# Patient Record
Sex: Male | Born: 1935 | Race: White | Hispanic: No | Marital: Married | Smoking: Former smoker
Health system: Southern US, Community
[De-identification: ages and names within clinical notes are randomized; demographics above are authoritative.]

## PROBLEM LIST (undated history)

## (undated) DIAGNOSIS — R5381 Other malaise: Secondary | ICD-10-CM

## (undated) DIAGNOSIS — G4733 Obstructive sleep apnea (adult) (pediatric): Secondary | ICD-10-CM

## (undated) DIAGNOSIS — G894 Chronic pain syndrome: Secondary | ICD-10-CM

## (undated) DIAGNOSIS — C9201 Acute myeloblastic leukemia, in remission: Secondary | ICD-10-CM

## (undated) DIAGNOSIS — M109 Gout, unspecified: Secondary | ICD-10-CM

## (undated) DIAGNOSIS — I219 Acute myocardial infarction, unspecified: Secondary | ICD-10-CM

## (undated) DIAGNOSIS — N529 Male erectile dysfunction, unspecified: Secondary | ICD-10-CM

## (undated) DIAGNOSIS — R1310 Dysphagia, unspecified: Secondary | ICD-10-CM

## (undated) DIAGNOSIS — I259 Chronic ischemic heart disease, unspecified: Secondary | ICD-10-CM

## (undated) DIAGNOSIS — K5730 Diverticulosis of large intestine without perforation or abscess without bleeding: Secondary | ICD-10-CM

## (undated) DIAGNOSIS — M545 Low back pain: Secondary | ICD-10-CM

## (undated) DIAGNOSIS — J329 Chronic sinusitis, unspecified: Secondary | ICD-10-CM

## (undated) DIAGNOSIS — I639 Cerebral infarction, unspecified: Secondary | ICD-10-CM

## (undated) DIAGNOSIS — K5732 Diverticulitis of large intestine without perforation or abscess without bleeding: Secondary | ICD-10-CM

## (undated) DIAGNOSIS — M25559 Pain in unspecified hip: Secondary | ICD-10-CM

## (undated) DIAGNOSIS — C801 Malignant (primary) neoplasm, unspecified: Secondary | ICD-10-CM

## (undated) DIAGNOSIS — N183 Chronic kidney disease, stage 3 (moderate): Secondary | ICD-10-CM

## (undated) DIAGNOSIS — N419 Inflammatory disease of prostate, unspecified: Secondary | ICD-10-CM

## (undated) DIAGNOSIS — I499 Cardiac arrhythmia, unspecified: Secondary | ICD-10-CM

## (undated) DIAGNOSIS — E785 Hyperlipidemia, unspecified: Secondary | ICD-10-CM

## (undated) DIAGNOSIS — J383 Other diseases of vocal cords: Secondary | ICD-10-CM

## (undated) DIAGNOSIS — I4891 Unspecified atrial fibrillation: Secondary | ICD-10-CM

## (undated) DIAGNOSIS — N401 Enlarged prostate with lower urinary tract symptoms: Secondary | ICD-10-CM

## (undated) DIAGNOSIS — M5430 Sciatica, unspecified side: Secondary | ICD-10-CM

## (undated) DIAGNOSIS — I428 Other cardiomyopathies: Secondary | ICD-10-CM

## (undated) DIAGNOSIS — I10 Essential (primary) hypertension: Secondary | ICD-10-CM

## (undated) DIAGNOSIS — G4700 Insomnia, unspecified: Secondary | ICD-10-CM

## (undated) DIAGNOSIS — I2510 Atherosclerotic heart disease of native coronary artery without angina pectoris: Secondary | ICD-10-CM

## (undated) DIAGNOSIS — Z515 Encounter for palliative care: Secondary | ICD-10-CM

## (undated) DIAGNOSIS — Z95810 Presence of automatic (implantable) cardiac defibrillator: Secondary | ICD-10-CM

## (undated) DIAGNOSIS — R498 Other voice and resonance disorders: Secondary | ICD-10-CM

## (undated) DIAGNOSIS — G7241 Inclusion body myositis [IBM]: Secondary | ICD-10-CM

## (undated) DIAGNOSIS — E119 Type 2 diabetes mellitus without complications: Secondary | ICD-10-CM

## (undated) DIAGNOSIS — D649 Anemia, unspecified: Secondary | ICD-10-CM

## (undated) DIAGNOSIS — Z9181 History of falling: Secondary | ICD-10-CM

## (undated) DIAGNOSIS — I509 Heart failure, unspecified: Secondary | ICD-10-CM

## (undated) DIAGNOSIS — I209 Angina pectoris, unspecified: Secondary | ICD-10-CM

## (undated) DIAGNOSIS — G609 Hereditary and idiopathic neuropathy, unspecified: Secondary | ICD-10-CM

## (undated) DIAGNOSIS — Z7901 Long term (current) use of anticoagulants: Secondary | ICD-10-CM

## (undated) DIAGNOSIS — F419 Anxiety disorder, unspecified: Secondary | ICD-10-CM

## (undated) DIAGNOSIS — R0602 Shortness of breath: Secondary | ICD-10-CM

## (undated) DIAGNOSIS — K219 Gastro-esophageal reflux disease without esophagitis: Secondary | ICD-10-CM

## (undated) DIAGNOSIS — J690 Pneumonitis due to inhalation of food and vomit: Secondary | ICD-10-CM

## (undated) DIAGNOSIS — I739 Peripheral vascular disease, unspecified: Secondary | ICD-10-CM

## (undated) DIAGNOSIS — R269 Unspecified abnormalities of gait and mobility: Secondary | ICD-10-CM

## (undated) DIAGNOSIS — J449 Chronic obstructive pulmonary disease, unspecified: Secondary | ICD-10-CM

## (undated) DIAGNOSIS — I6529 Occlusion and stenosis of unspecified carotid artery: Secondary | ICD-10-CM

## (undated) DIAGNOSIS — R443 Hallucinations, unspecified: Secondary | ICD-10-CM

## (undated) HISTORY — DX: Occlusion and stenosis of unspecified carotid artery: I65.29

## (undated) HISTORY — DX: Unspecified abnormalities of gait and mobility: R26.9

## (undated) HISTORY — DX: Obstructive sleep apnea (adult) (pediatric): G47.33

## (undated) HISTORY — DX: Anemia, unspecified: D64.9

## (undated) HISTORY — DX: Pain in unspecified hip: M25.559

## (undated) HISTORY — DX: Chronic pain syndrome: G89.4

## (undated) HISTORY — DX: Benign prostatic hyperplasia with lower urinary tract symptoms: N40.1

## (undated) HISTORY — DX: Unspecified atrial fibrillation: I48.91

## (undated) HISTORY — DX: Essential (primary) hypertension: I10

## (undated) HISTORY — DX: Insomnia, unspecified: G47.00

## (undated) HISTORY — DX: Pneumonitis due to inhalation of food and vomit: J69.0

## (undated) HISTORY — DX: Other cardiomyopathies: I42.8

## (undated) HISTORY — DX: Peripheral vascular disease, unspecified: I73.9

## (undated) HISTORY — DX: Long term (current) use of anticoagulants: Z79.01

## (undated) HISTORY — DX: Hallucinations, unspecified: R44.3

## (undated) HISTORY — DX: Cerebral infarction, unspecified: I63.9

## (undated) HISTORY — DX: Dysphagia, unspecified: R13.10

## (undated) HISTORY — DX: History of falling: Z91.81

## (undated) HISTORY — DX: Other diseases of vocal cords: J38.3

## (undated) HISTORY — DX: Other malaise: R53.81

## (undated) HISTORY — DX: Inflammatory disease of prostate, unspecified: N41.9

## (undated) HISTORY — DX: Low back pain: M54.5

## (undated) HISTORY — PX: CORONARY ARTERY BYPASS GRAFT: SHX141

## (undated) HISTORY — DX: Other voice and resonance disorders: R49.8

## (undated) HISTORY — DX: Chronic kidney disease, stage 3 (moderate): N18.3

## (undated) HISTORY — DX: Diverticulitis of large intestine without perforation or abscess without bleeding: K57.32

## (undated) HISTORY — DX: Gout, unspecified: M10.9

## (undated) HISTORY — DX: Anxiety disorder, unspecified: F41.9

## (undated) HISTORY — PX: OTHER SURGICAL HISTORY: SHX169

## (undated) HISTORY — DX: Diverticulosis of large intestine without perforation or abscess without bleeding: K57.30

## (undated) HISTORY — DX: Male erectile dysfunction, unspecified: N52.9

## (undated) HISTORY — DX: Hyperlipidemia, unspecified: E78.5

## (undated) HISTORY — DX: Chronic obstructive pulmonary disease, unspecified: J44.9

## (undated) HISTORY — DX: Chronic ischemic heart disease, unspecified: I25.9

## (undated) HISTORY — DX: Chronic sinusitis, unspecified: J32.9

## (undated) HISTORY — DX: Hereditary and idiopathic neuropathy, unspecified: G60.9

## (undated) HISTORY — DX: Sciatica, unspecified side: M54.30

---

## 1988-02-23 HISTORY — PX: MYELOGRAM: SHX5347

## 1991-08-23 HISTORY — PX: PTCA: SHX146

## 1992-09-22 HISTORY — PX: ABDOMINAL AORTIC ANEURYSM REPAIR: SUR1152

## 1996-02-23 HISTORY — PX: LITHOTRIPSY: SUR834

## 1996-11-22 HISTORY — PX: CARDIOVASCULAR STRESS TEST: SHX262

## 1996-12-23 HISTORY — PX: CARDIOVASCULAR STRESS TEST: SHX262

## 1998-08-23 ENCOUNTER — Encounter: Payer: Self-pay | Admitting: Internal Medicine

## 1998-08-23 ENCOUNTER — Ambulatory Visit (HOSPITAL_COMMUNITY): Admission: RE | Admit: 1998-08-23 | Discharge: 1998-08-23 | Payer: Self-pay | Admitting: Internal Medicine

## 1999-05-01 ENCOUNTER — Encounter: Payer: Self-pay | Admitting: Internal Medicine

## 1999-05-01 ENCOUNTER — Encounter: Admission: RE | Admit: 1999-05-01 | Discharge: 1999-05-01 | Payer: Self-pay | Admitting: Internal Medicine

## 1999-06-18 ENCOUNTER — Ambulatory Visit (HOSPITAL_COMMUNITY): Admission: RE | Admit: 1999-06-18 | Discharge: 1999-06-18 | Payer: Self-pay | Admitting: Orthopedic Surgery

## 1999-06-22 ENCOUNTER — Encounter: Payer: Self-pay | Admitting: Orthopedic Surgery

## 1999-06-22 ENCOUNTER — Ambulatory Visit (HOSPITAL_COMMUNITY): Admission: RE | Admit: 1999-06-22 | Discharge: 1999-06-22 | Payer: Self-pay | Admitting: Orthopedic Surgery

## 1999-07-20 ENCOUNTER — Ambulatory Visit (HOSPITAL_COMMUNITY): Admission: RE | Admit: 1999-07-20 | Discharge: 1999-07-20 | Payer: Self-pay | Admitting: Orthopedic Surgery

## 1999-07-20 ENCOUNTER — Encounter: Payer: Self-pay | Admitting: Orthopedic Surgery

## 1999-08-03 ENCOUNTER — Encounter: Payer: Self-pay | Admitting: Orthopedic Surgery

## 1999-08-03 ENCOUNTER — Ambulatory Visit (HOSPITAL_COMMUNITY): Admission: RE | Admit: 1999-08-03 | Discharge: 1999-08-03 | Payer: Self-pay | Admitting: Orthopedic Surgery

## 1999-12-28 ENCOUNTER — Encounter: Admission: RE | Admit: 1999-12-28 | Discharge: 1999-12-28 | Payer: Self-pay | Admitting: Specialist

## 1999-12-28 ENCOUNTER — Encounter: Payer: Self-pay | Admitting: Specialist

## 2000-01-06 ENCOUNTER — Encounter: Payer: Self-pay | Admitting: Specialist

## 2000-01-06 ENCOUNTER — Ambulatory Visit (HOSPITAL_COMMUNITY): Admission: RE | Admit: 2000-01-06 | Discharge: 2000-01-06 | Payer: Self-pay | Admitting: Specialist

## 2000-05-17 ENCOUNTER — Encounter: Admission: RE | Admit: 2000-05-17 | Discharge: 2000-05-17 | Payer: Self-pay | Admitting: Cardiology

## 2000-05-17 ENCOUNTER — Encounter: Payer: Self-pay | Admitting: Cardiology

## 2000-05-25 ENCOUNTER — Inpatient Hospital Stay (HOSPITAL_COMMUNITY): Admission: RE | Admit: 2000-05-25 | Discharge: 2000-05-26 | Payer: Self-pay | Admitting: Cardiology

## 2000-06-27 ENCOUNTER — Encounter: Payer: Self-pay | Admitting: Orthopedic Surgery

## 2000-06-27 ENCOUNTER — Encounter: Admission: RE | Admit: 2000-06-27 | Discharge: 2000-06-27 | Payer: Self-pay | Admitting: Orthopedic Surgery

## 2000-06-27 ENCOUNTER — Ambulatory Visit (HOSPITAL_COMMUNITY): Admission: RE | Admit: 2000-06-27 | Discharge: 2000-06-27 | Payer: Self-pay | Admitting: Radiology

## 2000-07-11 ENCOUNTER — Encounter: Payer: Self-pay | Admitting: Orthopedic Surgery

## 2000-07-11 ENCOUNTER — Ambulatory Visit (HOSPITAL_COMMUNITY): Admission: RE | Admit: 2000-07-11 | Discharge: 2000-07-11 | Payer: Self-pay | Admitting: Orthopedic Surgery

## 2000-07-11 ENCOUNTER — Encounter: Admission: RE | Admit: 2000-07-11 | Discharge: 2000-07-11 | Payer: Self-pay | Admitting: Orthopedic Surgery

## 2000-07-25 ENCOUNTER — Encounter: Admission: RE | Admit: 2000-07-25 | Discharge: 2000-07-25 | Payer: Self-pay | Admitting: Orthopedic Surgery

## 2000-07-25 ENCOUNTER — Encounter: Payer: Self-pay | Admitting: Orthopedic Surgery

## 2000-08-15 ENCOUNTER — Encounter: Payer: Self-pay | Admitting: Cardiology

## 2000-08-15 ENCOUNTER — Inpatient Hospital Stay (HOSPITAL_COMMUNITY): Admission: EM | Admit: 2000-08-15 | Discharge: 2000-08-20 | Payer: Self-pay | Admitting: Emergency Medicine

## 2000-08-17 ENCOUNTER — Encounter: Payer: Self-pay | Admitting: Vascular Surgery

## 2000-12-12 ENCOUNTER — Ambulatory Visit (HOSPITAL_COMMUNITY): Admission: RE | Admit: 2000-12-12 | Discharge: 2000-12-13 | Payer: Self-pay | Admitting: Cardiology

## 2001-05-02 ENCOUNTER — Encounter (HOSPITAL_BASED_OUTPATIENT_CLINIC_OR_DEPARTMENT_OTHER): Payer: Self-pay | Admitting: Internal Medicine

## 2001-05-02 ENCOUNTER — Inpatient Hospital Stay (HOSPITAL_COMMUNITY): Admission: AD | Admit: 2001-05-02 | Discharge: 2001-05-06 | Payer: Self-pay | Admitting: Internal Medicine

## 2001-05-03 ENCOUNTER — Encounter: Payer: Self-pay | Admitting: Internal Medicine

## 2001-05-04 ENCOUNTER — Encounter: Payer: Self-pay | Admitting: Internal Medicine

## 2001-05-05 ENCOUNTER — Encounter: Payer: Self-pay | Admitting: Internal Medicine

## 2001-05-22 ENCOUNTER — Encounter: Admission: RE | Admit: 2001-05-22 | Discharge: 2001-05-22 | Payer: Self-pay | Admitting: Internal Medicine

## 2001-05-22 ENCOUNTER — Encounter (HOSPITAL_BASED_OUTPATIENT_CLINIC_OR_DEPARTMENT_OTHER): Payer: Self-pay | Admitting: Internal Medicine

## 2001-06-12 ENCOUNTER — Encounter (HOSPITAL_BASED_OUTPATIENT_CLINIC_OR_DEPARTMENT_OTHER): Payer: Self-pay | Admitting: Internal Medicine

## 2001-06-12 ENCOUNTER — Encounter: Admission: RE | Admit: 2001-06-12 | Discharge: 2001-06-12 | Payer: Self-pay | Admitting: Internal Medicine

## 2001-08-22 HISTORY — PX: CARDIAC CATHETERIZATION: SHX172

## 2001-11-13 ENCOUNTER — Encounter (HOSPITAL_BASED_OUTPATIENT_CLINIC_OR_DEPARTMENT_OTHER): Payer: Self-pay | Admitting: Internal Medicine

## 2001-11-13 ENCOUNTER — Encounter: Admission: RE | Admit: 2001-11-13 | Discharge: 2001-11-13 | Payer: Self-pay | Admitting: Internal Medicine

## 2001-12-04 ENCOUNTER — Ambulatory Visit (HOSPITAL_COMMUNITY): Admission: RE | Admit: 2001-12-04 | Discharge: 2001-12-04 | Payer: Self-pay | Admitting: Neurology

## 2001-12-08 ENCOUNTER — Ambulatory Visit (HOSPITAL_COMMUNITY): Admission: RE | Admit: 2001-12-08 | Discharge: 2001-12-08 | Payer: Self-pay | Admitting: Vascular Surgery

## 2002-05-10 ENCOUNTER — Ambulatory Visit (HOSPITAL_COMMUNITY): Admission: RE | Admit: 2002-05-10 | Discharge: 2002-05-11 | Payer: Self-pay | Admitting: Cardiology

## 2002-09-22 ENCOUNTER — Encounter: Payer: Self-pay | Admitting: Emergency Medicine

## 2002-09-22 ENCOUNTER — Emergency Department (HOSPITAL_COMMUNITY): Admission: EM | Admit: 2002-09-22 | Discharge: 2002-09-23 | Payer: Self-pay | Admitting: Emergency Medicine

## 2004-05-13 ENCOUNTER — Encounter: Admission: RE | Admit: 2004-05-13 | Discharge: 2004-05-13 | Payer: Self-pay | Admitting: Internal Medicine

## 2004-05-15 ENCOUNTER — Encounter: Admission: RE | Admit: 2004-05-15 | Discharge: 2004-05-15 | Payer: Self-pay | Admitting: Orthopedic Surgery

## 2004-05-27 ENCOUNTER — Encounter: Admission: RE | Admit: 2004-05-27 | Discharge: 2004-05-27 | Payer: Self-pay | Admitting: Internal Medicine

## 2005-06-17 DIAGNOSIS — K5732 Diverticulitis of large intestine without perforation or abscess without bleeding: Secondary | ICD-10-CM

## 2005-06-17 HISTORY — DX: Diverticulitis of large intestine without perforation or abscess without bleeding: K57.32

## 2005-06-18 DIAGNOSIS — N419 Inflammatory disease of prostate, unspecified: Secondary | ICD-10-CM

## 2005-06-18 HISTORY — DX: Inflammatory disease of prostate, unspecified: N41.9

## 2006-04-17 DIAGNOSIS — M109 Gout, unspecified: Secondary | ICD-10-CM

## 2006-04-17 HISTORY — DX: Gout, unspecified: M10.9

## 2006-06-20 DIAGNOSIS — I6529 Occlusion and stenosis of unspecified carotid artery: Secondary | ICD-10-CM

## 2006-06-20 HISTORY — DX: Occlusion and stenosis of unspecified carotid artery: I65.29

## 2006-11-22 DIAGNOSIS — N529 Male erectile dysfunction, unspecified: Secondary | ICD-10-CM

## 2006-11-22 DIAGNOSIS — G609 Hereditary and idiopathic neuropathy, unspecified: Secondary | ICD-10-CM

## 2006-11-22 DIAGNOSIS — R1310 Dysphagia, unspecified: Secondary | ICD-10-CM

## 2006-11-22 HISTORY — DX: Hereditary and idiopathic neuropathy, unspecified: G60.9

## 2006-11-22 HISTORY — DX: Male erectile dysfunction, unspecified: N52.9

## 2006-11-22 HISTORY — DX: Dysphagia, unspecified: R13.10

## 2007-02-21 DIAGNOSIS — F419 Anxiety disorder, unspecified: Secondary | ICD-10-CM

## 2007-02-21 HISTORY — DX: Anxiety disorder, unspecified: F41.9

## 2007-09-05 ENCOUNTER — Inpatient Hospital Stay (HOSPITAL_COMMUNITY): Admission: AD | Admit: 2007-09-05 | Discharge: 2007-09-10 | Payer: Self-pay | Admitting: Cardiology

## 2007-09-07 ENCOUNTER — Encounter (INDEPENDENT_AMBULATORY_CARE_PROVIDER_SITE_OTHER): Payer: Self-pay | Admitting: Cardiology

## 2007-10-02 ENCOUNTER — Inpatient Hospital Stay (HOSPITAL_COMMUNITY): Admission: EM | Admit: 2007-10-02 | Discharge: 2007-10-05 | Payer: Self-pay | Admitting: Emergency Medicine

## 2008-02-27 ENCOUNTER — Ambulatory Visit: Payer: Self-pay | Admitting: Internal Medicine

## 2008-02-27 ENCOUNTER — Inpatient Hospital Stay (HOSPITAL_COMMUNITY): Admission: EM | Admit: 2008-02-27 | Discharge: 2008-03-08 | Payer: Self-pay | Admitting: Emergency Medicine

## 2008-03-18 ENCOUNTER — Inpatient Hospital Stay (HOSPITAL_COMMUNITY): Admission: EM | Admit: 2008-03-18 | Discharge: 2008-03-22 | Payer: Self-pay | Admitting: Emergency Medicine

## 2008-06-25 ENCOUNTER — Encounter: Payer: Self-pay | Admitting: Internal Medicine

## 2008-07-03 ENCOUNTER — Ambulatory Visit: Payer: Self-pay | Admitting: Internal Medicine

## 2008-07-03 DIAGNOSIS — R05 Cough: Secondary | ICD-10-CM

## 2008-07-03 DIAGNOSIS — R0602 Shortness of breath: Secondary | ICD-10-CM | POA: Insufficient documentation

## 2008-07-03 DIAGNOSIS — I4891 Unspecified atrial fibrillation: Secondary | ICD-10-CM | POA: Insufficient documentation

## 2008-07-03 DIAGNOSIS — J45909 Unspecified asthma, uncomplicated: Secondary | ICD-10-CM | POA: Insufficient documentation

## 2008-07-03 DIAGNOSIS — J8409 Other alveolar and parieto-alveolar conditions: Secondary | ICD-10-CM | POA: Insufficient documentation

## 2008-07-05 ENCOUNTER — Encounter: Payer: Self-pay | Admitting: Internal Medicine

## 2008-07-09 ENCOUNTER — Encounter: Payer: Self-pay | Admitting: Internal Medicine

## 2008-07-15 ENCOUNTER — Encounter: Payer: Self-pay | Admitting: Internal Medicine

## 2008-07-17 ENCOUNTER — Telehealth: Payer: Self-pay | Admitting: Internal Medicine

## 2008-07-26 ENCOUNTER — Ambulatory Visit: Payer: Self-pay | Admitting: Internal Medicine

## 2008-07-31 ENCOUNTER — Ambulatory Visit: Payer: Self-pay | Admitting: Internal Medicine

## 2008-08-08 ENCOUNTER — Ambulatory Visit: Payer: Self-pay | Admitting: Critical Care Medicine

## 2008-08-08 DIAGNOSIS — R1312 Dysphagia, oropharyngeal phase: Secondary | ICD-10-CM

## 2008-08-20 ENCOUNTER — Encounter (HOSPITAL_COMMUNITY): Admission: RE | Admit: 2008-08-20 | Discharge: 2008-11-18 | Payer: Self-pay | Admitting: Internal Medicine

## 2008-08-20 ENCOUNTER — Encounter: Payer: Self-pay | Admitting: Internal Medicine

## 2008-08-28 ENCOUNTER — Encounter: Admission: RE | Admit: 2008-08-28 | Discharge: 2008-11-16 | Payer: Self-pay | Admitting: Critical Care Medicine

## 2008-09-14 DIAGNOSIS — G4733 Obstructive sleep apnea (adult) (pediatric): Secondary | ICD-10-CM

## 2008-09-14 HISTORY — DX: Obstructive sleep apnea (adult) (pediatric): G47.33

## 2008-09-27 ENCOUNTER — Telehealth (INDEPENDENT_AMBULATORY_CARE_PROVIDER_SITE_OTHER): Payer: Self-pay | Admitting: *Deleted

## 2008-10-09 DIAGNOSIS — J329 Chronic sinusitis, unspecified: Secondary | ICD-10-CM

## 2008-10-09 DIAGNOSIS — M545 Low back pain, unspecified: Secondary | ICD-10-CM

## 2008-10-09 HISTORY — DX: Low back pain, unspecified: M54.50

## 2008-10-09 HISTORY — DX: Chronic sinusitis, unspecified: J32.9

## 2008-10-18 ENCOUNTER — Ambulatory Visit: Payer: Self-pay | Admitting: Internal Medicine

## 2008-10-18 DIAGNOSIS — J209 Acute bronchitis, unspecified: Secondary | ICD-10-CM | POA: Insufficient documentation

## 2008-10-22 ENCOUNTER — Telehealth: Payer: Self-pay | Admitting: Internal Medicine

## 2008-10-23 ENCOUNTER — Telehealth (INDEPENDENT_AMBULATORY_CARE_PROVIDER_SITE_OTHER): Payer: Self-pay | Admitting: *Deleted

## 2008-10-24 ENCOUNTER — Inpatient Hospital Stay (HOSPITAL_COMMUNITY): Admission: EM | Admit: 2008-10-24 | Discharge: 2008-10-30 | Payer: Self-pay | Admitting: Emergency Medicine

## 2008-10-27 ENCOUNTER — Ambulatory Visit: Payer: Self-pay | Admitting: Critical Care Medicine

## 2008-10-29 ENCOUNTER — Encounter: Payer: Self-pay | Admitting: Emergency Medicine

## 2008-10-30 ENCOUNTER — Encounter (INDEPENDENT_AMBULATORY_CARE_PROVIDER_SITE_OTHER): Payer: Self-pay | Admitting: Cardiovascular Disease

## 2008-11-12 ENCOUNTER — Ambulatory Visit: Payer: Self-pay | Admitting: Internal Medicine

## 2008-11-16 ENCOUNTER — Encounter: Payer: Self-pay | Admitting: Pulmonary Disease

## 2008-11-18 ENCOUNTER — Inpatient Hospital Stay (HOSPITAL_COMMUNITY): Admission: EM | Admit: 2008-11-18 | Discharge: 2008-11-22 | Payer: Self-pay | Admitting: Emergency Medicine

## 2008-11-18 ENCOUNTER — Ambulatory Visit: Payer: Self-pay | Admitting: Internal Medicine

## 2008-11-19 ENCOUNTER — Encounter: Payer: Self-pay | Admitting: Internal Medicine

## 2008-11-21 ENCOUNTER — Encounter: Payer: Self-pay | Admitting: Internal Medicine

## 2008-11-22 ENCOUNTER — Encounter: Payer: Self-pay | Admitting: Internal Medicine

## 2008-11-28 ENCOUNTER — Telehealth: Payer: Self-pay | Admitting: Internal Medicine

## 2008-11-29 ENCOUNTER — Ambulatory Visit: Payer: Self-pay | Admitting: Internal Medicine

## 2008-12-05 ENCOUNTER — Ambulatory Visit: Payer: Self-pay

## 2008-12-05 ENCOUNTER — Encounter: Payer: Self-pay | Admitting: Internal Medicine

## 2008-12-10 ENCOUNTER — Telehealth: Payer: Self-pay | Admitting: Internal Medicine

## 2008-12-10 ENCOUNTER — Ambulatory Visit: Payer: Self-pay | Admitting: Internal Medicine

## 2008-12-11 ENCOUNTER — Encounter: Payer: Self-pay | Admitting: Internal Medicine

## 2008-12-11 DIAGNOSIS — Z95810 Presence of automatic (implantable) cardiac defibrillator: Secondary | ICD-10-CM | POA: Insufficient documentation

## 2008-12-11 LAB — CONVERTED CEMR LAB
BUN: 36 mg/dL — ABNORMAL HIGH (ref 6–23)
Basophils Absolute: 0 10*3/uL (ref 0.0–0.1)
Basophils Relative: 0 % (ref 0–1)
CO2: 23 meq/L (ref 19–32)
Calcium: 9.7 mg/dL (ref 8.4–10.5)
Creatinine, Ser: 1.25 mg/dL (ref 0.40–1.50)
Eosinophils Absolute: 0 10*3/uL (ref 0.0–0.7)
Eosinophils Relative: 0 % (ref 0–5)
Glucose, Bld: 98 mg/dL (ref 70–99)
HCT: 33.6 % — ABNORMAL LOW (ref 39.0–52.0)
Hemoglobin, Urine: NEGATIVE
Hemoglobin: 10.2 g/dL — ABNORMAL LOW (ref 13.0–17.0)
Ketones, ur: NEGATIVE mg/dL
Leukocytes, UA: NEGATIVE
MCHC: 30.4 g/dL (ref 30.0–36.0)
MCV: 89.1 fL (ref 78.0–100.0)
Monocytes Absolute: 0.6 10*3/uL (ref 0.1–1.0)
Monocytes Relative: 12 % (ref 3–12)
Neutro Abs: 3.4 10*3/uL (ref 1.7–7.7)
Nitrite: NEGATIVE
Protein, ur: NEGATIVE mg/dL
RDW: 16.7 % — ABNORMAL HIGH (ref 11.5–15.5)

## 2008-12-12 ENCOUNTER — Encounter: Payer: Self-pay | Admitting: Internal Medicine

## 2008-12-16 ENCOUNTER — Encounter: Payer: Self-pay | Admitting: Internal Medicine

## 2008-12-18 ENCOUNTER — Telehealth: Payer: Self-pay | Admitting: Internal Medicine

## 2008-12-20 ENCOUNTER — Telehealth: Payer: Self-pay | Admitting: Internal Medicine

## 2008-12-23 DIAGNOSIS — Z7901 Long term (current) use of anticoagulants: Secondary | ICD-10-CM

## 2008-12-23 DIAGNOSIS — I4891 Unspecified atrial fibrillation: Secondary | ICD-10-CM

## 2008-12-23 HISTORY — DX: Unspecified atrial fibrillation: I48.91

## 2008-12-23 HISTORY — DX: Long term (current) use of anticoagulants: Z79.01

## 2008-12-26 ENCOUNTER — Ambulatory Visit: Payer: Self-pay | Admitting: Internal Medicine

## 2008-12-26 DIAGNOSIS — I5022 Chronic systolic (congestive) heart failure: Secondary | ICD-10-CM

## 2009-01-01 ENCOUNTER — Ambulatory Visit: Payer: Self-pay | Admitting: Critical Care Medicine

## 2009-01-01 ENCOUNTER — Telehealth (INDEPENDENT_AMBULATORY_CARE_PROVIDER_SITE_OTHER): Payer: Self-pay | Admitting: *Deleted

## 2009-01-03 ENCOUNTER — Encounter: Payer: Self-pay | Admitting: Critical Care Medicine

## 2009-01-03 DIAGNOSIS — J4489 Other specified chronic obstructive pulmonary disease: Secondary | ICD-10-CM | POA: Insufficient documentation

## 2009-01-03 DIAGNOSIS — J449 Chronic obstructive pulmonary disease, unspecified: Secondary | ICD-10-CM

## 2009-01-03 LAB — CONVERTED CEMR LAB
BUN: 37 mg/dL — ABNORMAL HIGH (ref 6–23)
Basophils Absolute: 0.1 10*3/uL (ref 0.0–0.1)
CO2: 26 meq/L (ref 19–32)
Calcium: 9.7 mg/dL (ref 8.4–10.5)
Eosinophils Absolute: 0.1 10*3/uL (ref 0.0–0.7)
GFR calc non Af Amer: 61.91 mL/min (ref >60)
Glucose, Bld: 89 mg/dL (ref 70–99)
HCT: 29.6 % — ABNORMAL LOW (ref 39.0–52.0)
Hemoglobin: 9.5 g/dL — ABNORMAL LOW (ref 13.0–17.0)
Lymphocytes Relative: 14.5 % (ref 12.0–46.0)
Lymphs Abs: 1 10*3/uL (ref 0.7–4.0)
MCHC: 32 g/dL (ref 30.0–36.0)
Monocytes Relative: 7.7 % (ref 3.0–12.0)
Neutro Abs: 5 10*3/uL (ref 1.4–7.7)
Platelets: 215 10*3/uL (ref 150.0–400.0)
Pro B Natriuretic peptide (BNP): 1869 pg/mL — ABNORMAL HIGH (ref 0.0–100.0)
RDW: 18.4 % — ABNORMAL HIGH (ref 11.5–14.6)
Sodium: 139 meq/L (ref 135–145)

## 2009-01-15 DIAGNOSIS — I428 Other cardiomyopathies: Secondary | ICD-10-CM

## 2009-01-15 HISTORY — DX: Other cardiomyopathies: I42.8

## 2009-01-17 ENCOUNTER — Ambulatory Visit: Payer: Self-pay | Admitting: Internal Medicine

## 2009-01-24 ENCOUNTER — Encounter: Payer: Self-pay | Admitting: Critical Care Medicine

## 2009-02-03 ENCOUNTER — Telehealth (INDEPENDENT_AMBULATORY_CARE_PROVIDER_SITE_OTHER): Payer: Self-pay | Admitting: *Deleted

## 2009-02-22 DIAGNOSIS — G7241 Inclusion body myositis [IBM]: Secondary | ICD-10-CM

## 2009-02-22 HISTORY — DX: Inclusion body myositis (IBM): G72.41

## 2009-02-27 ENCOUNTER — Ambulatory Visit: Payer: Self-pay

## 2009-02-27 ENCOUNTER — Encounter: Payer: Self-pay | Admitting: Internal Medicine

## 2009-04-16 ENCOUNTER — Ambulatory Visit: Payer: Self-pay | Admitting: Internal Medicine

## 2009-04-16 DIAGNOSIS — I2589 Other forms of chronic ischemic heart disease: Secondary | ICD-10-CM | POA: Insufficient documentation

## 2009-07-03 ENCOUNTER — Telehealth: Payer: Self-pay | Admitting: Internal Medicine

## 2009-07-15 ENCOUNTER — Ambulatory Visit: Payer: Self-pay | Admitting: Internal Medicine

## 2009-07-16 DIAGNOSIS — G4700 Insomnia, unspecified: Secondary | ICD-10-CM

## 2009-07-16 HISTORY — DX: Insomnia, unspecified: G47.00

## 2009-07-23 ENCOUNTER — Inpatient Hospital Stay (HOSPITAL_COMMUNITY): Admission: EM | Admit: 2009-07-23 | Discharge: 2009-08-04 | Payer: Self-pay | Admitting: Emergency Medicine

## 2009-07-24 ENCOUNTER — Encounter (INDEPENDENT_AMBULATORY_CARE_PROVIDER_SITE_OTHER): Payer: Self-pay | Admitting: Cardiovascular Disease

## 2009-07-28 ENCOUNTER — Encounter: Payer: Self-pay | Admitting: Internal Medicine

## 2009-10-07 DIAGNOSIS — R5381 Other malaise: Secondary | ICD-10-CM

## 2009-10-07 HISTORY — DX: Other malaise: R53.81

## 2009-10-16 ENCOUNTER — Ambulatory Visit: Payer: Self-pay | Admitting: Internal Medicine

## 2009-11-05 ENCOUNTER — Encounter: Payer: Self-pay | Admitting: Internal Medicine

## 2009-12-25 ENCOUNTER — Telehealth: Payer: Self-pay | Admitting: Internal Medicine

## 2010-01-22 ENCOUNTER — Ambulatory Visit: Payer: Self-pay | Admitting: Internal Medicine

## 2010-02-04 ENCOUNTER — Encounter: Payer: Self-pay | Admitting: Internal Medicine

## 2010-03-04 DIAGNOSIS — R443 Hallucinations, unspecified: Secondary | ICD-10-CM

## 2010-03-04 DIAGNOSIS — R269 Unspecified abnormalities of gait and mobility: Secondary | ICD-10-CM

## 2010-03-04 HISTORY — DX: Hallucinations, unspecified: R44.3

## 2010-03-04 HISTORY — DX: Unspecified abnormalities of gait and mobility: R26.9

## 2010-03-15 ENCOUNTER — Encounter: Payer: Self-pay | Admitting: Physical Medicine and Rehabilitation

## 2010-03-24 NOTE — Progress Notes (Signed)
Summary: pt wnat to talk to nurse about cp he had  Phone Note Call from Patient Call back at Home Phone [phone]   Caller: Patient Reason for Call: Talk to Nurse, Talk to Doctor Summary of Call: pt had an episode on Nov 1st from 8p - 9p of chest pain and his hospice nurse came to visit and told him to call the office and notify someone  Initial call taken by: Omer Jack,  December 25, 2009 10:35 AM  Follow-up for Phone Call        spoke with pt he is going to send a transmission.  Belenda Cruise is going to help him in sending Dennis Bast, RN, BSN  December 25, 2009 12:24 PM

## 2010-03-24 NOTE — Cardiovascular Report (Signed)
Summary: Office Visit   Office Visit   Imported By: Roderic Ovens 03/18/2009 16:52:21  _____________________________________________________________________  External Attachment:    Type:   Image     Comment:   External Document

## 2010-03-24 NOTE — Procedures (Signed)
Summary: [email or web address] check.[email or web address].amber   Current Medications (verified): 1)  Allopurinol 300 Mg Tabs (Allopurinol) .... Take 1 Tablet By Mouth Once A Day 2)  Aspirin 81 Mg Tbec (Aspirin) .Marland Kitchen.. 1 Once Daily 3)  Bystolic 5 Mg Tabs (Nebivolol Hcl) .... One-Half Tablet Daily 4)  Centrum Silver  Tabs (Multiple Vitamins-Minerals) .... Take 1 Tablet By Mouth Once A Day 5)  Furosemide 40 Mg Tabs (Furosemide) .... Take One Tab Twice Daily 6)  Glipizide 5 Mg Tabs (Glipizide) .... 1/4 Tab Once Daily When Glucose Over 115 7)  Metformin Hcl 1000 Mg Tabs (Metformin Hcl) .... Take 1 Tablet By Mouth Twice A Day 8)  Omeprazole 20 Mg Cpdr (Omeprazole) .Marland Kitchen.. 1 Tab Two Times A Day 9)  Plavix 75 Mg Tabs (Clopidogrel Bisulfate) .... Take 1 Tablet By Mouth Once A Day 10)  Warfarin Sodium 2 Mg Tabs (Warfarin Sodium) .... Per Clinic 11)  Sertraline Hcl 100 Mg Tabs (Sertraline Hcl) .... 1/2 Tab Once Daily 12)  Vitamin D3 2000 Unit Caps (Cholecalciferol) .... Take 1 Tablet By Mouth Once A Day 13)  Poly-Iron 150 150 Mg Caps (Polysaccharide Iron Complex) .... Take 1 Capsule By Mouth Two Times A Day 14)  Folic Acid 1 Mg Tabs (Folic Acid) .... Take 1 Tablet By Mouth Once A Day 15)  Oxygen .... 2l Continuous At Bedtime and As Needed With Activity 16)  Xanax 0.25 Mg Tabs (Alprazolam) .Marland Kitchen.. 1 Every 4 Hours As Needed 17)  Xopenex Hfa 45 Mcg/act Aero (Levalbuterol Tartrate) .... Up To 2 Puffs Every 4 Hours As Needed 18)  Nitro-Dur 0.4 Mg/hr Pt24 (Nitroglycerin) .Marland Kitchen.. 1 Under Tongue Every 5 Min X3 As Needed 19)  Miralax  Powd (Polyethylene Glycol 3350) .Marland Kitchen.. 1 Capsul Daily As Needed 20)  Mucinex Dm 30-600 Mg Xr12h-Tab (Dextromethorphan-Guaifenesin) .Marland Kitchen.. 1-2 Every 12 Hours As Needed 21)  Tessalon 200 Mg Caps (Benzonatate) .Marland Kitchen.. 1 By Mouth Every 8 Hours  As Needed Cough 22)  Flomax 0.4 Mg Caps (Tamsulosin Hcl) .... One By Mouth Daily  Allergies (verified): 1)  ! Codeine 2)  ! Bactrim   ICD Specifications Following MD:  Lewayne Bunting, MD      Referring MD:  BERRY ICD Vendor:  Medtronic     ICD Model Number:  [phone]     ICD Serial Number:  [id number] ICD DOI:  11/20/2008     ICD Implanting MD:  Lewayne Bunting, MD  Lead 1:    Location: RA     DOI: 11/20/2008     Model #: [id number]     Serial #: [id number]     Status: active Lead 2:    Location: RV     DOI: 11/20/2008     Model #: [id number]     Serial #: [id number]     Status: active Lead 3:    Location: LV     DOI: 11/20/2008     Model #: [id number]     Serial #: [id number]     Status: active  Indications::  CM   ICD Follow Up Remote Check?  No Battery Voltage:  3.19 V     Charge Time:  8.8 seconds     Underlying rhythm:  SB ICD Dependent:  No       ICD Device Measurements Atrium:  Amplitude: 1.9 mV, Impedance: 418 ohms, Threshold: 1.0 V at 0.4 msec Right Ventricle:  Amplitude: 13.5 mV, Impedance: 475 ohms, Threshold: 1.0 V at 0.4 msec Left Ventricle:  Impedance: 589 ohms, Threshold: 1.0 V at  0.4 msec Configuration: LV TIP TO LV RING Shock Impedance: 48/55 ohms   Episodes MS Episodes:  7     Percent Mode Switch:  1.7%     Coumadin:  Yes Shock:  0     ATP:  0     Nonsustained:  0     Atrial Pacing:  14.6%     Ventricular Pacing:  87.1%  Brady Parameters Mode DDD     Lower Rate Limit:  60     Upper Rate Limit 130 PAV 210     Sensed AV Delay:  180  Tachy Zones VF:  200     VT:  176     Tech Comments:  Pt seen as add-on because of confusion in appointments.  Normal device function.  No changes made today.  Pt with increased PVC burden, which pt is symptomatic with.  Will D/W Dr Ladona Ridgel need for medication adjustments before appt in February.  Pt otherwise is well.  Activity level is up, Optivol is flat.  ROV as scheduled with Dr Ladona Ridgel in February. Gypsy Balsam RN BSN  February 27, 2009 4:55 PM  MD Comments:  Agree with above.

## 2010-03-24 NOTE — Progress Notes (Signed)
Summary: fu needed  Phone Note Outgoing Call   Summary of Call: ther is paper work from [place] for [employer]. Apparently he only wnats it as needed. We need to reassess his o2 state. He has not seen me in long time. Bring him in Initial call taken by: Kalman Shan MD,  Jul 03, 2009 4:16 PM     Appended Document: fu needed spoke with pt wife and advised pt needs to come in for OV to be assesed for oxygen use because there is some discrepancy in what was ordered and what pt states he is using, according to form received from [place]. Pt wife staets seh will call back to set appt.

## 2010-03-24 NOTE — Assessment & Plan Note (Signed)
Summary: defib check/sl   Referring Provider:  Dr Cheri Kearns [facility] Primary Provider:  Dr. Murray Hodgkins [place], Dr. Lorayne Marek [facility], Dr. Allyson Sabal [facility]   History of Present Illness: Dustin [name] returns today for followup.  He is a pleasant 77 yo man with a h/o an ICM, s/p MI, CHF and BBB who is s/p BiV ICD implant.  His procedure was initally complicated by a pocket hematoma which ultimately has resolved.  He denies c/p.  He has class 2 CHF symptoms.  He denies peripheral edema.  No intercurrent ICD Therapies.   He denies palpitations.  He is back exercising on a regular basis.  He denies any intercurrent ICD therapies.    Current Medications (verified): 1)  Allopurinol 300 Mg Tabs (Allopurinol) .... Take 1 Tablet By Mouth Once A Day 2)  Aspirin 81 Mg Tbec (Aspirin) .Marland Kitchen.. 1 Once Daily 3)  Bystolic 5 Mg Tabs (Nebivolol Hcl) .... One-Half Tablet Daily 4)  Centrum Silver  Tabs (Multiple Vitamins-Minerals) .... Take 1 Tablet By Mouth Once A Day 5)  Furosemide 40 Mg Tabs (Furosemide) .... Take One Tab Twice Daily 6)  Glipizide 5 Mg Tabs (Glipizide) .... 1/4 Tab Once Daily When Glucose Over 115 7)  Metformin Hcl 1000 Mg Tabs (Metformin Hcl) .... Take 1 Tablet By Mouth Twice A Day 8)  Omeprazole 20 Mg Cpdr (Omeprazole) .Marland Kitchen.. 1 Tab Two Times A Day 9)  Plavix 75 Mg Tabs (Clopidogrel Bisulfate) .... Take 1 Tablet By Mouth Once A Day 10)  Warfarin Sodium 2 Mg Tabs (Warfarin Sodium) .... Per Clinic 11)  Sertraline Hcl 100 Mg Tabs (Sertraline Hcl) .... 1/2 Tab Once Daily 12)  Vitamin D3 2000 Unit Caps (Cholecalciferol) .... Take 1 Tablet By Mouth Once A Day 13)  Poly-Iron 150 150 Mg Caps (Polysaccharide Iron Complex) .... Take 1 Capsule By Mouth Two Times A Day 14)  Folic Acid 1 Mg Tabs (Folic Acid) .... Hold 15)  Oxygen .... 2l Continuous At Bedtime and As Needed With Activity 16)  Xanax 0.25 Mg Tabs (Alprazolam) .Marland Kitchen.. 1 Every 4 Hours As Needed 17)  Xopenex Hfa 45 Mcg/act Aero (Levalbuterol Tartrate) ....  Up To 2 Puffs Every 4 Hours As Needed 18)  Nitrostat 0.4 Mg Subl (Nitroglycerin) .... As Needed 19)  Miralax  Powd (Polyethylene Glycol 3350) .Marland Kitchen.. 1 Capsul Daily As Needed 20)  Mucinex Dm 30-600 Mg Xr12h-Tab (Dextromethorphan-Guaifenesin) .Marland Kitchen.. 1-2 Every 12 Hours As Needed 21)  Tessalon 200 Mg Caps (Benzonatate) .Marland Kitchen.. 1 By Mouth Every 8 Hours  As Needed Cough 22)  Flomax 0.4 Mg Caps (Tamsulosin Hcl) .... One By Mouth Daily  Allergies (verified): 1)  ! Codeine 2)  ! Bactrim  Past History:  Past Medical History: Last updated: 11/12/2008 IHD s/p cabg 1995.............................................................................Marland KitchenBerry     -  non-ST elevation MI in July 2009. and NSTEMI Jan 2010 in setting of RLL CAP. 99% diagonal       disease s/p stent July of 2009. Cath Jan 2010 showed  occulusion of SVG to RCA   LVEF 45%. rec med rx     - Echo 10/29/08 EF 25%  Peripheral arterial disease.   Hypertension.   Hyperlipidemia.   Diabetes.  Rectal hemorrhoids.  Obstructive sleep apnea.  Refuses to wear his CPAP.  Not on oxygen       at home.   Vocal cord dysfunction with Botox injection at [facility] around 2005  per patient.       -  Patient's swallowing difficulties may be related to vocal        cord dysfunction, recommended to be followed up at [facility]  Questionable Parkinson disease. History of left brain cerebrovascular accident in 1998 without       residual.   Dysphagia Jan 2010 admission       -swallowing study nl oropharyngeal swallow, brief pause         at top of the esophagus which clears with liquid wash or small bites.         Rec  regular diet with thin liquids and continue management of         esophageal dysmotility.       - Barium swallow 10/31/08  mild dilation upper esosphagus, moderate slowing, can't r/o mass ANEMIA NOS            03/21/2008 hgb 10.9, MCV 86, Stool occult blood postiive -> On IV Iron  COPD  GOLD II     - Hypoxemia,  former smoker-     - FEV1 1.86 (72%) , ratio 68% (07/26/08)     - FEV1 1.61 (62%) , ratio 74    ( 10/29/08) with no insp or exp truncation on f/v loop    Past Surgical History: Last updated: 07/03/2008 Bypass- 1995 Triple A- 1994 Aortobifemoral bypass graft in the past.   Review of Systems  The patient denies chest pain, syncope, dyspnea on exertion, and peripheral edema.    Vital Signs:  Patient profile:   77 year old male Height:      69 inches Weight:      154 pounds BMI:     22.82 Pulse rate:   68 / minute Resp:     16 per minute BP sitting:   120 / 76  (left arm)  Vitals Entered By: Marrion Coy, CNA (April 16, 2009 12:28 PM)  Physical Exam  General:  Elderly, NAD Head:  normocephalic and atraumatic Eyes:  PERRLA/EOM intact; conjunctiva and lids normal. Mouth:  Teeth, gums and palate normal. Oral mucosa normal. Neck:  Neck supple, no JVD. No masses, thyromegaly or abnormal cervical nodes. Chest Wall:  ICD incision is without tenderness or erythema. Lungs:  Clear bilaterally with no wheezes, rales, or rhonchi Heart:  RRR with normal S1 and S2.  PMI is enlarged and laterally displaced. Abdomen:  Bowel sounds positive; abdomen soft and non-tender without masses, organomegaly, or hernias noted. No hepatosplenomegaly. Msk:  Back normal, normal gait. Muscle strength and tone normal. Pulses:  pulses normal in all 4 extremities Extremities:  No edema. Neurologic:  Alert and oriented x 3.    ICD Specifications Following MD:  Dustin Bunting, MD     Referring MD:  BERRY ICD Vendor:  Medtronic     ICD Model Number:  [id number]     ICD Serial Number:  [id number] ICD DOI:  11/20/2008     ICD Implanting MD:  Dustin Bunting, MD  Lead 1:    Location: RA     DOI: 11/20/2008     Model #: [id number]     Serial #: [id number]     Status: active Lead 2:    Location: RV     DOI: 11/20/2008     Model #: [id number]     Serial #: [id number]     Status: active Lead 3:    Location: LV     DOI: 11/20/2008      Model #: [id number]     Serial #: [id number]  Status: active  Indications::  CM   ICD Follow Up Remote Check?  No Battery Voltage:  3.19 V     Charge Time:  8.8 seconds     Underlying rhythm:  SR ICD Dependent:  No       ICD Device Measurements Atrium:  Amplitude: 2.3 mV, Impedance: 418 ohms, Threshold: 1.0 V at 0.4 msec Right Ventricle:  Amplitude: 13.3 mV, Impedance: 494 ohms, Threshold: 0.75 V at 0.4 msec Left Ventricle:  Impedance: 589 ohms, Threshold: 1.0 V at 0.4 msec Configuration: LV TIP TO LV RING Shock Impedance: 48/57 ohms   Episodes MS Episodes:  7     Percent Mode Switch:  0.6^     Coumadin:  Yes Shock:  0     ATP:  0     Nonsustained:  1     Atrial Pacing:  25.5%     Ventricular Pacing:  86.8%  Brady Parameters Mode DDD     Lower Rate Limit:  60     Upper Rate Limit 130 PAV 210     Sensed AV Delay:  180  Tachy Zones VF:  200     VT:  176     Next Remote Date:  07/15/2009     Next Cardiology Appt Due:  03/25/2010 Tech Comments:  No parameter changes.  Device function normal. Ventricular rates controlled during A-fib, + coumadin [facility] transmissions every 3 months. ROV 1 year Dr. Ladona Ridgel. Altha Harm, LPN  April 16, 2009 1:02 PM  MD Comments:  Note PAF present.  Impression & Recommendations:  Problem # 1:  CHRONIC SYSTOLIC HEART FAILURE (ICD-428.22) He appears to be class 2.  Continue current meds and maintain a low sodium diet. His updated medication list for this problem includes:    Aspirin 81 Mg Tbec (Aspirin) .Marland Kitchen... 1 once daily    Bystolic 5 Mg Tabs (Nebivolol hcl) ..... One-half tablet daily    Furosemide 40 Mg Tabs (Furosemide) .Marland Kitchen... Take one tab twice daily    Plavix 75 Mg Tabs (Clopidogrel bisulfate) .Marland Kitchen... Take 1 tablet by mouth once a day    Warfarin Sodium 2 Mg Tabs (Warfarin sodium) .Marland Kitchen... Per clinic    Nitrostat 0.4 Mg Subl (Nitroglycerin) .Marland Kitchen... As needed  Problem # 2:  AUTOMATIC IMPLANTABLE CARDIAC DEFIBRILLATOR SITU (ICD-V45.02) His device is  working normally.  He has had no VT.  His optivol is flat.  Problem # 3:  Hx of ATRIAL FIBRILLATION (ICD-427.31) He continues to have recurrent, brief episodes of atrial fib. His updated medication list for this problem includes:    Aspirin 81 Mg Tbec (Aspirin) .Marland Kitchen... 1 once daily    Bystolic 5 Mg Tabs (Nebivolol hcl) ..... One-half tablet daily    Plavix 75 Mg Tabs (Clopidogrel bisulfate) .Marland Kitchen... Take 1 tablet by mouth once a day    Warfarin Sodium 2 Mg Tabs (Warfarin sodium) .Marland Kitchen... Per clinic  Problem # 4:  CARDIOMYOPATHY, ISCHEMIC (ICD-414.8) The patient has had no anginal symptoms and is one year out from his stent.  He remains on plavix.  I wonder whether the risk/benefit for continued plavix persists.  I will allow his primary cardiologist, Dr. Allyson Sabal who placed his stent decide on whether we can stop the plavix and continue ASA and coumadin. His updated medication list for this problem includes:    Aspirin 81 Mg Tbec (Aspirin) .Marland Kitchen... 1 once daily    Bystolic 5 Mg Tabs (Nebivolol hcl) ..... One-half tablet daily    Furosemide 40 Mg Tabs (  Furosemide) .Marland Kitchen... Take one tab twice daily    Plavix 75 Mg Tabs (Clopidogrel bisulfate) .Marland Kitchen... Take 1 tablet by mouth once a day    Warfarin Sodium 2 Mg Tabs (Warfarin sodium) .Marland Kitchen... Per clinic    Nitrostat 0.4 Mg Subl (Nitroglycerin) .Marland Kitchen... As needed  Patient Instructions: 1)  Your physician recommends that you schedule a follow-up appointment in: 12 months with Dr Ladona Ridgel

## 2010-03-24 NOTE — Letter (Signed)
Summary: Remote Device Check  [facility], Main Office  [address]   [place], [place] [address]   Phone: [phone]  Fax: [phone]     November 05, 2009 MRN: [id number]   Dustin [name] [address] [place], [place]  [address]   Dear [name],   Your remote transmission was recieved and reviewed by your physician.  All diagnostics were within normal limits for you.  __X___Your next transmission is scheduled for:  01-22-10.  Please transmit at any time this day.  If you have a wireless device your transmission will be sent automatically.   Sincerely,  Vella Kohler

## 2010-03-24 NOTE — Letter (Signed)
Summary: Remote Device Check  [facility], Main Office  [address]   [place], [place] [address]   Phone: [phone]  Fax: [phone]     July 28, 2009 MRN: [id number]   RAYFORD WILLIAMSEN [address] [place], [place]  [address]   Dear Mr. CAHOON,   Your remote transmission was recieved and reviewed by your physician.  All diagnostics were within normal limits for you.  __X___Your next transmission is scheduled for:   10-16-2009.  Please transmit at any time this day.  If you have a wireless device your transmission will be sent automatically.   Sincerely,  Vella Kohler

## 2010-03-24 NOTE — Cardiovascular Report (Signed)
Summary: Office Visit Remote   Office Visit Remote   Imported By: Roderic Ovens 08/07/2009 16:19:00  _____________________________________________________________________  External Attachment:    Type:   Image     Comment:   External Document

## 2010-03-24 NOTE — Cardiovascular Report (Signed)
Summary: Office Visit Remote   Office Visit Remote   Imported By: Roderic Ovens 11/11/2009 13:56:08  _____________________________________________________________________  External Attachment:    Type:   Image     Comment:   External Document

## 2010-03-25 DIAGNOSIS — M25559 Pain in unspecified hip: Secondary | ICD-10-CM

## 2010-03-25 HISTORY — DX: Pain in unspecified hip: M25.559

## 2010-03-26 NOTE — Letter (Signed)
Summary: Remote Device Check  [facility], Main Office  [address]   [place], [place] [address]   Phone: [phone]  Fax: [phone]     February 04, 2010 MRN: [id number]   Dustin [name] [address] [place], [place]  [address]   Dear [name],   Your remote transmission was recieved and reviewed by your physician.  All diagnostics were within normal limits for you.   ___X___Your next office visit is scheduled for:  February 2012 with Dr Ladona Ridgel. Please call our office to schedule an appointment.    Sincerely,  Vella Kohler

## 2010-03-26 NOTE — Cardiovascular Report (Signed)
Summary: Office Visit Remote   Office Visit Remote   Imported By: Roderic Ovens 02/05/2010 14:12:27  _____________________________________________________________________  External Attachment:    Type:   Image     Comment:   External Document

## 2010-05-07 ENCOUNTER — Ambulatory Visit
Admission: RE | Admit: 2010-05-07 | Discharge: 2010-05-07 | Disposition: A | Discharge status: Home / Self Care | Payer: 59 | Source: Ambulatory Visit | Attending: Internal Medicine | Admitting: Internal Medicine

## 2010-05-07 ENCOUNTER — Other Ambulatory Visit: Payer: Self-pay | Admitting: Internal Medicine

## 2010-05-07 DIAGNOSIS — K56609 Unspecified intestinal obstruction, unspecified as to partial versus complete obstruction: Secondary | ICD-10-CM

## 2010-05-07 DIAGNOSIS — R52 Pain, unspecified: Secondary | ICD-10-CM

## 2010-05-07 DIAGNOSIS — G894 Chronic pain syndrome: Secondary | ICD-10-CM

## 2010-05-07 DIAGNOSIS — K5730 Diverticulosis of large intestine without perforation or abscess without bleeding: Secondary | ICD-10-CM

## 2010-05-07 HISTORY — DX: Diverticulosis of large intestine without perforation or abscess without bleeding: K57.30

## 2010-05-07 HISTORY — DX: Chronic pain syndrome: G89.4

## 2010-05-11 LAB — CROSSMATCH
ABO/RH(D): A POS
Antibody Screen: NEGATIVE

## 2010-05-11 LAB — POCT I-STAT, CHEM 8
BUN: 89 mg/dL — ABNORMAL HIGH (ref 6–23)
Calcium, Ion: 0.95 mmol/L — ABNORMAL LOW (ref 1.12–1.32)
Chloride: 106 mEq/L (ref 96–112)
HCT: 29 % — ABNORMAL LOW (ref 39.0–52.0)
Potassium: 6.1 mEq/L — ABNORMAL HIGH (ref 3.5–5.1)
Sodium: 138 mEq/L (ref 135–145)

## 2010-05-11 LAB — BASIC METABOLIC PANEL
BUN: 51 mg/dL — ABNORMAL HIGH (ref 6–23)
BUN: 57 mg/dL — ABNORMAL HIGH (ref 6–23)
BUN: 60 mg/dL — ABNORMAL HIGH (ref 6–23)
BUN: 61 mg/dL — ABNORMAL HIGH (ref 6–23)
CO2: 25 mEq/L (ref 19–32)
CO2: 32 mEq/L (ref 19–32)
CO2: 33 mEq/L — ABNORMAL HIGH (ref 19–32)
CO2: 35 mEq/L — ABNORMAL HIGH (ref 19–32)
CO2: 35 mEq/L — ABNORMAL HIGH (ref 19–32)
Calcium: 8.5 mg/dL (ref 8.4–10.5)
Calcium: 8.9 mg/dL (ref 8.4–10.5)
Calcium: 8.9 mg/dL (ref 8.4–10.5)
Calcium: 9 mg/dL (ref 8.4–10.5)
Calcium: 9 mg/dL (ref 8.4–10.5)
Calcium: 9.1 mg/dL (ref 8.4–10.5)
Calcium: 9.2 mg/dL (ref 8.4–10.5)
Calcium: 9.3 mg/dL (ref 8.4–10.5)
Calcium: 9.3 mg/dL (ref 8.4–10.5)
Chloride: 100 mEq/L (ref 96–112)
Chloride: 94 mEq/L — ABNORMAL LOW (ref 96–112)
Chloride: 96 mEq/L (ref 96–112)
Creatinine, Ser: 1.78 mg/dL — ABNORMAL HIGH (ref 0.4–1.5)
Creatinine, Ser: 1.86 mg/dL — ABNORMAL HIGH (ref 0.4–1.5)
Creatinine, Ser: 1.86 mg/dL — ABNORMAL HIGH (ref 0.4–1.5)
Creatinine, Ser: 2.14 mg/dL — ABNORMAL HIGH (ref 0.4–1.5)
Creatinine, Ser: 2.17 mg/dL — ABNORMAL HIGH (ref 0.4–1.5)
GFR calc Af Amer: 31 mL/min — ABNORMAL LOW (ref >60)
GFR calc Af Amer: 36 mL/min — ABNORMAL LOW (ref >60)
GFR calc Af Amer: 36 mL/min — ABNORMAL LOW (ref >60)
GFR calc Af Amer: 43 mL/min — ABNORMAL LOW (ref >60)
GFR calc Af Amer: 45 mL/min — ABNORMAL LOW (ref >60)
GFR calc non Af Amer: 28 mL/min — ABNORMAL LOW (ref >60)
GFR calc non Af Amer: 30 mL/min — ABNORMAL LOW (ref >60)
GFR calc non Af Amer: 30 mL/min — ABNORMAL LOW (ref >60)
GFR calc non Af Amer: 31 mL/min — ABNORMAL LOW (ref >60)
GFR calc non Af Amer: 35 mL/min — ABNORMAL LOW (ref >60)
GFR calc non Af Amer: 35 mL/min — ABNORMAL LOW (ref >60)
GFR calc non Af Amer: 35 mL/min — ABNORMAL LOW (ref >60)
GFR calc non Af Amer: 37 mL/min — ABNORMAL LOW (ref >60)
Glucose, Bld: 109 mg/dL — ABNORMAL HIGH (ref 70–99)
Glucose, Bld: 119 mg/dL — ABNORMAL HIGH (ref 70–99)
Glucose, Bld: 122 mg/dL — ABNORMAL HIGH (ref 70–99)
Glucose, Bld: 136 mg/dL — ABNORMAL HIGH (ref 70–99)
Glucose, Bld: 79 mg/dL (ref 70–99)
Potassium: 3.3 mEq/L — ABNORMAL LOW (ref 3.5–5.1)
Potassium: 3.5 mEq/L (ref 3.5–5.1)
Potassium: 3.8 mEq/L (ref 3.5–5.1)
Potassium: 3.9 mEq/L (ref 3.5–5.1)
Potassium: 4 mEq/L (ref 3.5–5.1)
Sodium: 136 mEq/L (ref 135–145)
Sodium: 138 mEq/L (ref 135–145)
Sodium: 138 mEq/L (ref 135–145)
Sodium: 139 mEq/L (ref 135–145)
Sodium: 142 mEq/L (ref 135–145)

## 2010-05-11 LAB — GLUCOSE, CAPILLARY
Glucose-Capillary: 101 mg/dL — ABNORMAL HIGH (ref 70–99)
Glucose-Capillary: 112 mg/dL — ABNORMAL HIGH (ref 70–99)
Glucose-Capillary: 155 mg/dL — ABNORMAL HIGH (ref 70–99)
Glucose-Capillary: 159 mg/dL — ABNORMAL HIGH (ref 70–99)
Glucose-Capillary: 167 mg/dL — ABNORMAL HIGH (ref 70–99)
Glucose-Capillary: 177 mg/dL — ABNORMAL HIGH (ref 70–99)
Glucose-Capillary: 182 mg/dL — ABNORMAL HIGH (ref 70–99)
Glucose-Capillary: 183 mg/dL — ABNORMAL HIGH (ref 70–99)
Glucose-Capillary: 203 mg/dL — ABNORMAL HIGH (ref 70–99)
Glucose-Capillary: 215 mg/dL — ABNORMAL HIGH (ref 70–99)
Glucose-Capillary: 237 mg/dL — ABNORMAL HIGH (ref 70–99)
Glucose-Capillary: 84 mg/dL (ref 70–99)
Glucose-Capillary: 89 mg/dL (ref 70–99)
Glucose-Capillary: 95 mg/dL (ref 70–99)
Glucose-Capillary: 98 mg/dL (ref 70–99)

## 2010-05-11 LAB — DIFFERENTIAL
Basophils Absolute: 0 10*3/uL (ref 0.0–0.1)
Basophils Relative: 0 % (ref 0–1)
Eosinophils Relative: 4 % (ref 0–5)
Lymphocytes Relative: 13 % (ref 12–46)
Lymphocytes Relative: 15 % (ref 12–46)
Lymphs Abs: 1 10*3/uL (ref 0.7–4.0)
Monocytes Absolute: 0.6 10*3/uL (ref 0.1–1.0)
Monocytes Absolute: 0.7 10*3/uL (ref 0.1–1.0)
Monocytes Relative: 9 % (ref 3–12)
Neutro Abs: 6.7 10*3/uL (ref 1.7–7.7)
Neutrophils Relative %: 78 % — ABNORMAL HIGH (ref 43–77)

## 2010-05-11 LAB — URINALYSIS, ROUTINE W REFLEX MICROSCOPIC
Bilirubin Urine: NEGATIVE
Hgb urine dipstick: NEGATIVE
Ketones, ur: NEGATIVE mg/dL
Protein, ur: NEGATIVE mg/dL
Urobilinogen, UA: 0.2 mg/dL (ref 0.0–1.0)

## 2010-05-11 LAB — CBC
HCT: 24 % — ABNORMAL LOW (ref 39.0–52.0)
HCT: 28.5 % — ABNORMAL LOW (ref 39.0–52.0)
HCT: 29 % — ABNORMAL LOW (ref 39.0–52.0)
HCT: 29.1 % — ABNORMAL LOW (ref 39.0–52.0)
HCT: 29.3 % — ABNORMAL LOW (ref 39.0–52.0)
Hemoglobin: 9.3 g/dL — ABNORMAL LOW (ref 13.0–17.0)
Hemoglobin: 9.4 g/dL — ABNORMAL LOW (ref 13.0–17.0)
Hemoglobin: 9.5 g/dL — ABNORMAL LOW (ref 13.0–17.0)
MCHC: 32.2 g/dL (ref 30.0–36.0)
MCHC: 32.4 g/dL (ref 30.0–36.0)
MCHC: 32.5 g/dL (ref 30.0–36.0)
MCHC: 32.6 g/dL (ref 30.0–36.0)
MCHC: 32.9 g/dL (ref 30.0–36.0)
MCV: 92.8 fL (ref 78.0–100.0)
MCV: 93 fL (ref 78.0–100.0)
MCV: 93 fL (ref 78.0–100.0)
Platelets: 185 10*3/uL (ref 150–400)
Platelets: 185 10*3/uL (ref 150–400)
Platelets: 193 10*3/uL (ref 150–400)
Platelets: 212 10*3/uL (ref 150–400)
Platelets: 216 10*3/uL (ref 150–400)
Platelets: 300 10*3/uL (ref 150–400)
RBC: 3.05 MIL/uL — ABNORMAL LOW (ref 4.22–5.81)
RBC: 3.09 MIL/uL — ABNORMAL LOW (ref 4.22–5.81)
RBC: 3.13 MIL/uL — ABNORMAL LOW (ref 4.22–5.81)
RDW: 17.2 % — ABNORMAL HIGH (ref 11.5–15.5)
RDW: 17.2 % — ABNORMAL HIGH (ref 11.5–15.5)
RDW: 17.5 % — ABNORMAL HIGH (ref 11.5–15.5)
RDW: 17.8 % — ABNORMAL HIGH (ref 11.5–15.5)
RDW: 18 % — ABNORMAL HIGH (ref 11.5–15.5)
WBC: 6.3 10*3/uL (ref 4.0–10.5)
WBC: 6.7 10*3/uL (ref 4.0–10.5)
WBC: 7 10*3/uL (ref 4.0–10.5)
WBC: 7.1 10*3/uL (ref 4.0–10.5)
WBC: 7.2 10*3/uL (ref 4.0–10.5)

## 2010-05-11 LAB — COMPREHENSIVE METABOLIC PANEL
AST: 26 U/L (ref 0–37)
Albumin: 3.1 g/dL — ABNORMAL LOW (ref 3.5–5.2)
Calcium: 8.7 mg/dL (ref 8.4–10.5)
Creatinine, Ser: 1.9 mg/dL — ABNORMAL HIGH (ref 0.4–1.5)
GFR calc Af Amer: 41 mL/min — ABNORMAL LOW (ref >60)
Total Protein: 5.7 g/dL — ABNORMAL LOW (ref 6.0–8.3)

## 2010-05-11 LAB — PROTIME-INR
INR: 1.8 — ABNORMAL HIGH (ref 0.00–1.49)
INR: 1.89 — ABNORMAL HIGH (ref 0.00–1.49)
INR: 2.33 — ABNORMAL HIGH (ref 0.00–1.49)
INR: 2.43 — ABNORMAL HIGH (ref 0.00–1.49)
INR: 2.46 — ABNORMAL HIGH (ref 0.00–1.49)
INR: 2.55 — ABNORMAL HIGH (ref 0.00–1.49)
INR: 2.62 — ABNORMAL HIGH (ref 0.00–1.49)
INR: 2.67 — ABNORMAL HIGH (ref 0.00–1.49)
Prothrombin Time: 21.5 seconds — ABNORMAL HIGH (ref 11.6–15.2)
Prothrombin Time: 25.4 seconds — ABNORMAL HIGH (ref 11.6–15.2)
Prothrombin Time: 26.5 seconds — ABNORMAL HIGH (ref 11.6–15.2)
Prothrombin Time: 27.2 seconds — ABNORMAL HIGH (ref 11.6–15.2)

## 2010-05-11 LAB — POCT CARDIAC MARKERS
Myoglobin, poc: 396 ng/mL (ref 12–200)
Troponin i, poc: <0.05 ng/mL (ref 0.00–0.09)

## 2010-05-11 LAB — BRAIN NATRIURETIC PEPTIDE
Pro B Natriuretic peptide (BNP): 2503 pg/mL — ABNORMAL HIGH (ref 0.0–100.0)
Pro B Natriuretic peptide (BNP): 2881 pg/mL — ABNORMAL HIGH (ref 0.0–100.0)

## 2010-05-11 LAB — TSH: TSH: 2.532 u[IU]/mL (ref 0.350–4.500)

## 2010-05-11 LAB — MRSA PCR SCREENING: MRSA by PCR: NEGATIVE

## 2010-05-11 LAB — HEMOGLOBIN AND HEMATOCRIT, BLOOD: Hemoglobin: 8.3 g/dL — ABNORMAL LOW (ref 13.0–17.0)

## 2010-05-15 ENCOUNTER — Telehealth: Payer: Self-pay | Admitting: Internal Medicine

## 2010-05-15 NOTE — Telephone Encounter (Signed)
Pt has appoint with scott weaver--05/22/10 at 11am--nt

## 2010-05-15 NOTE — Telephone Encounter (Signed)
Pt calling re heavy feeling in chest x2-3 weeks

## 2010-05-15 NOTE — Telephone Encounter (Signed)
Spoke with pt's wife who states pt was to make f/u appoint for this month,but forgot to do so--also when asked about CP wife stated that she had already spoken to someone about this--i will set up appoint and phone pt back--nt

## 2010-05-20 IMAGING — CR DG CHEST 2V
2 series · 2 of 2 positions shown · non-contrast
Comparison: Portable chest x-rays 10/02/2007 and 09/06/2007.  CT
chest 05/13/2004.

CLINICAL DATA: Chest pain.  Shortness of breath.  Fever.

CHEST - 2 VIEW 02/27/2008:

[w chest pa]
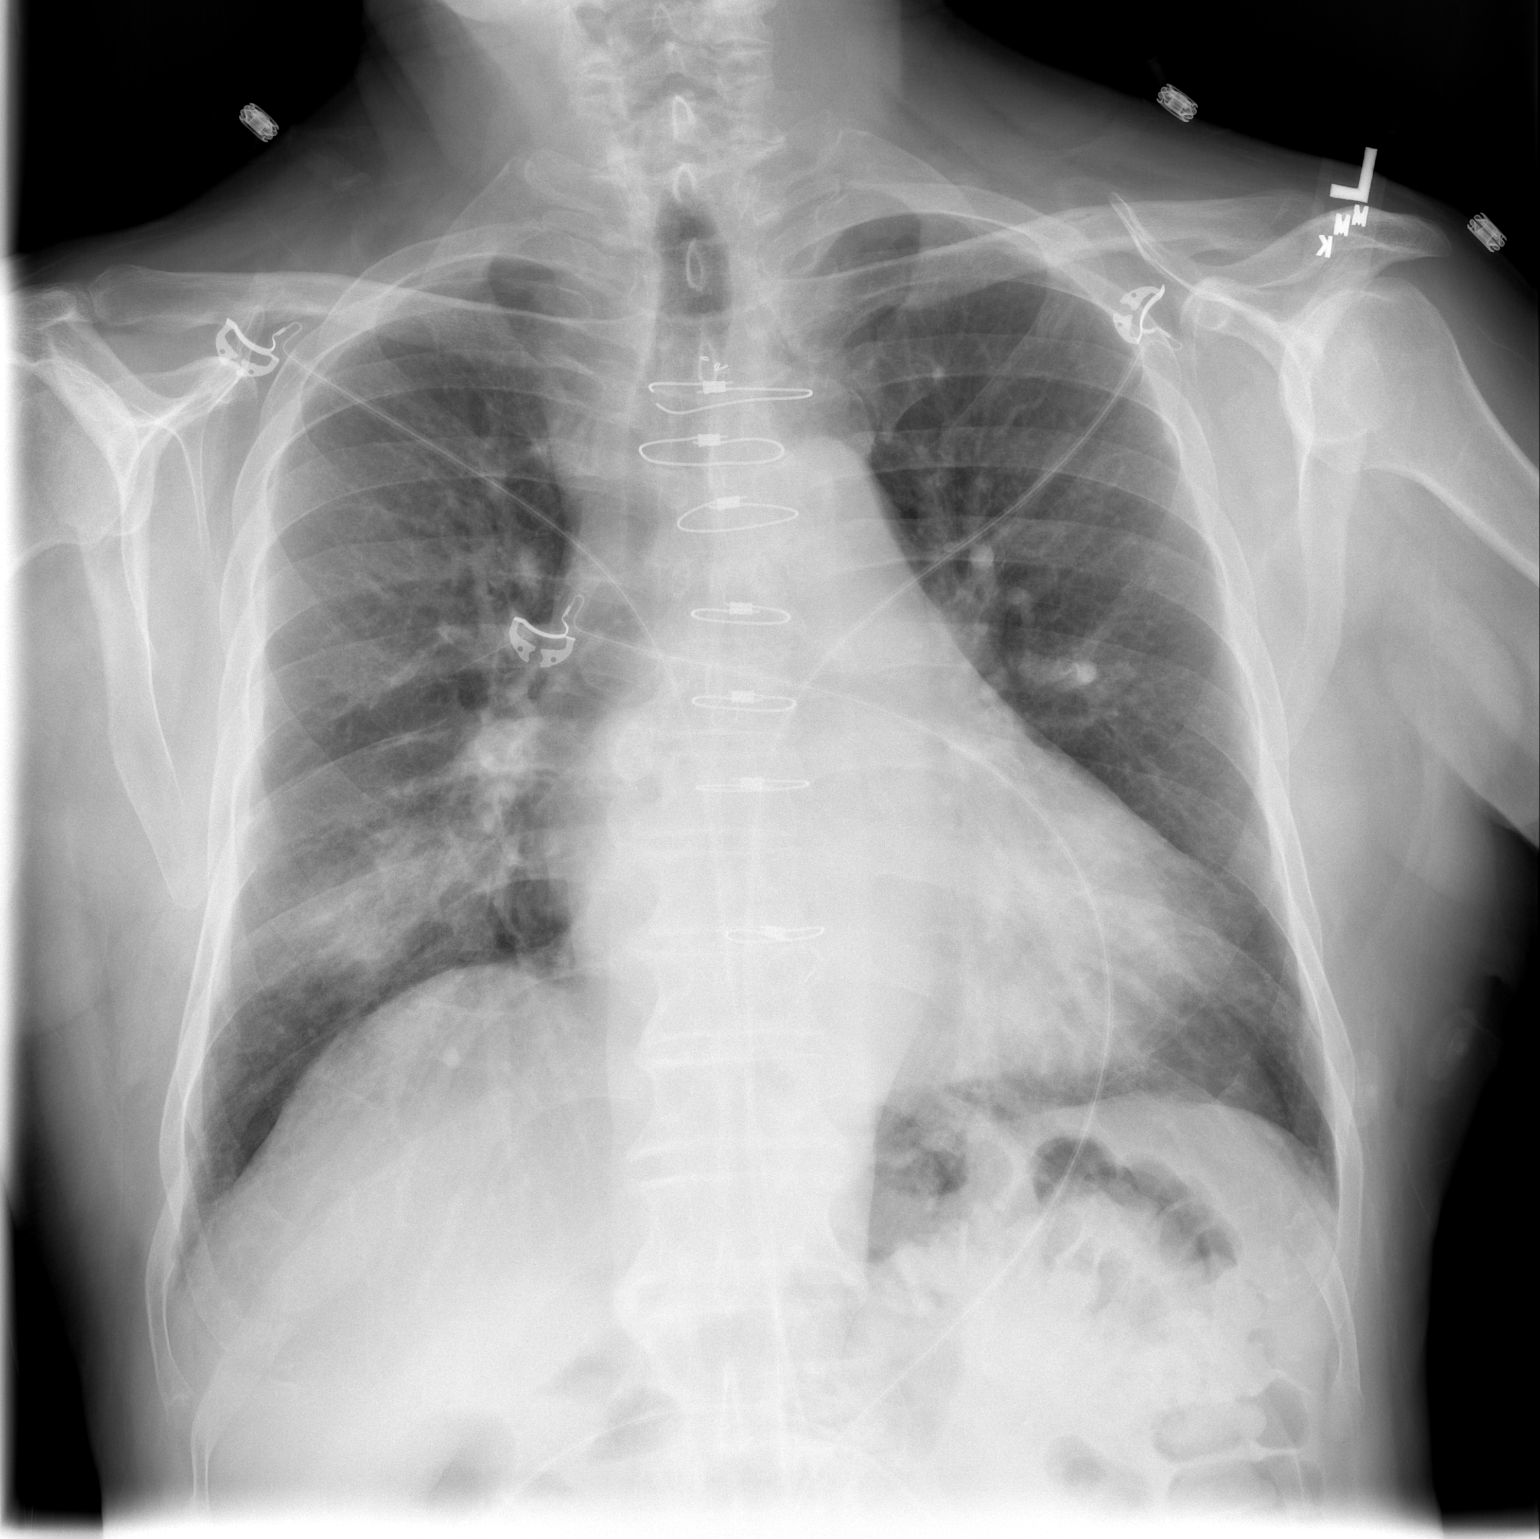

[w chest lat]
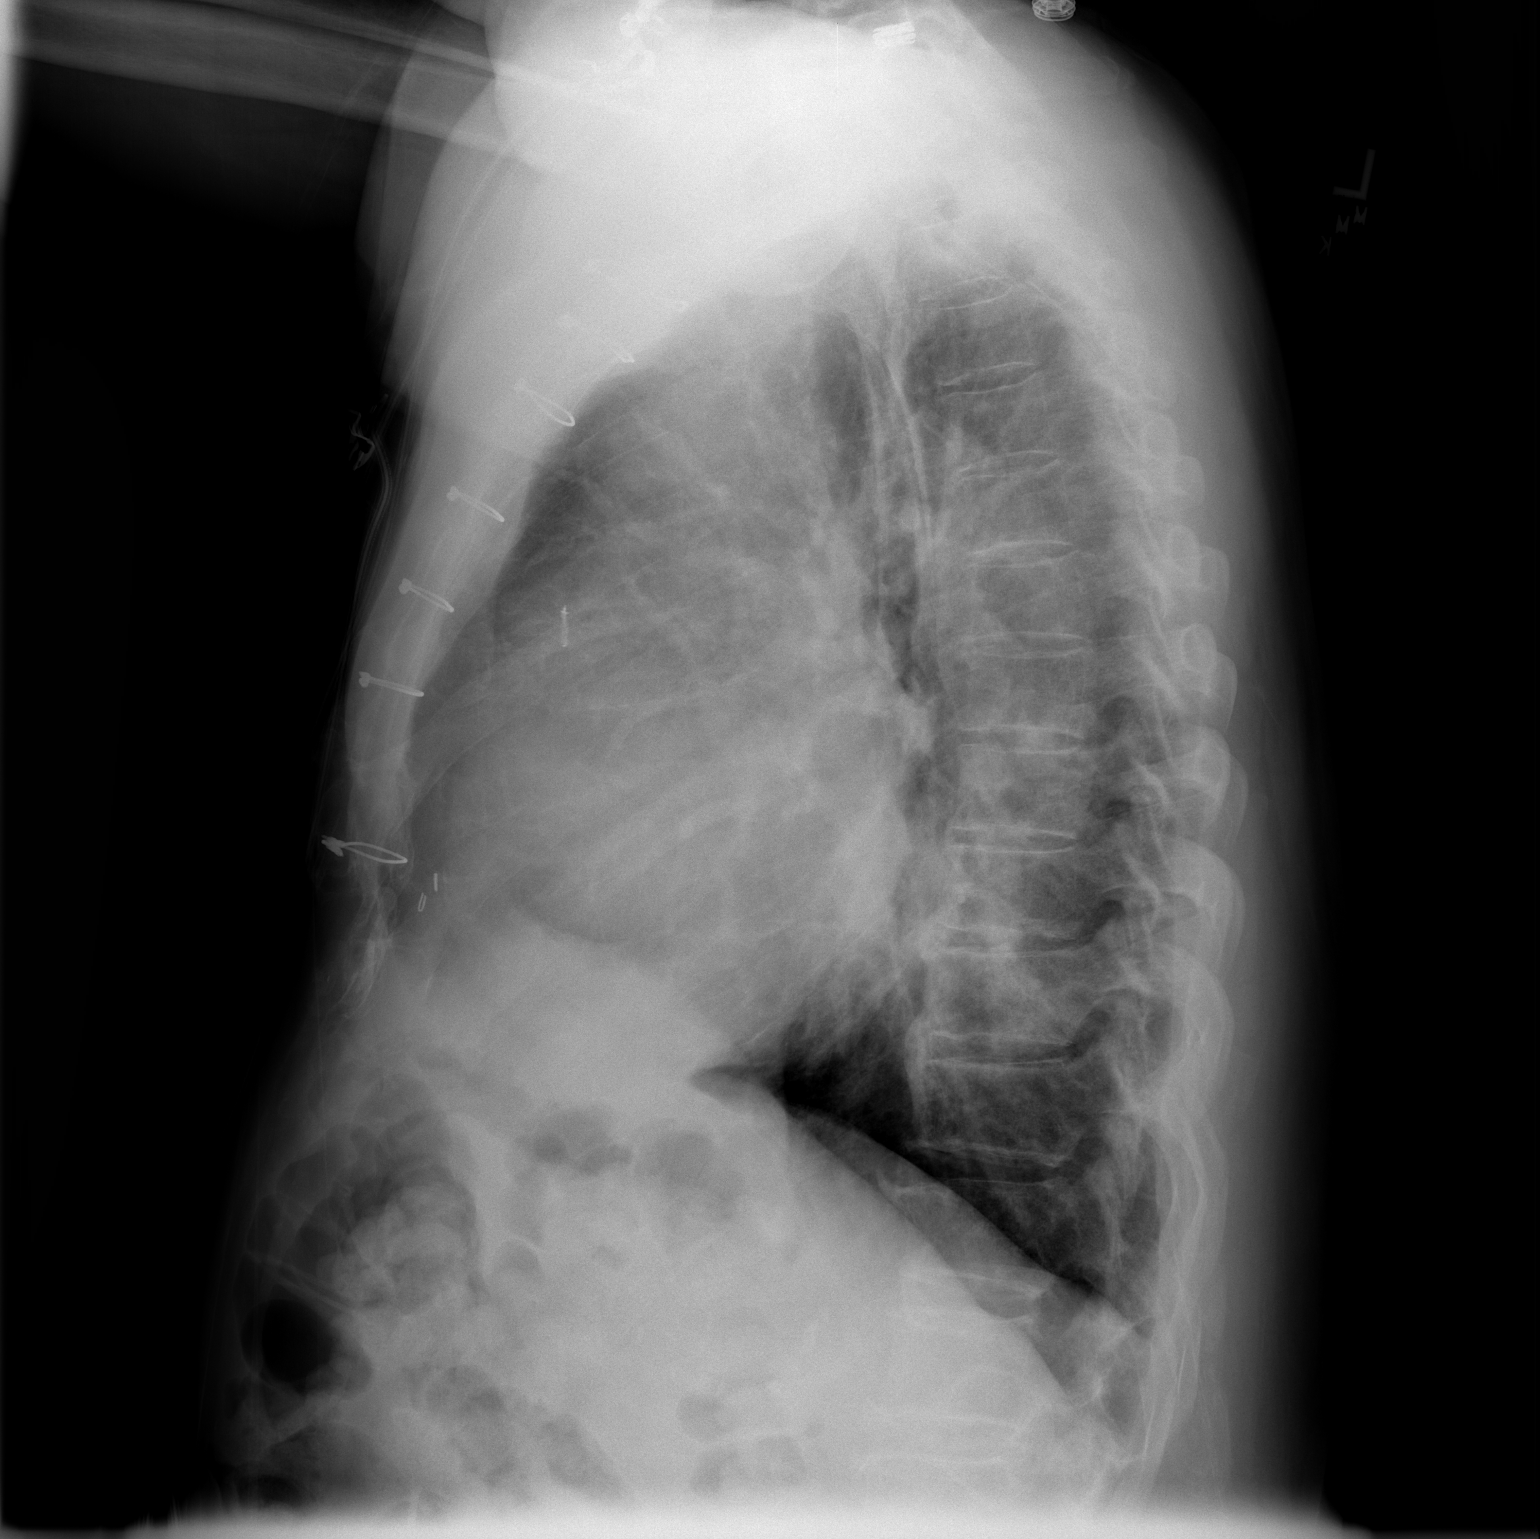

[2 of 2 positions shown; findings below may reference images not displayed]

FINDINGS: Prior sternotomy.  Heart enlarged but stable.  Hilar and
mediastinal contours unremarkable.  Pulmonary vascularity normal
without evidence of pulmonary edema.  Hyperinflation.  Patchy
airspace opacities in the right lower lobe.  Lungs otherwise clear.
No pleural effusions.  Degenerative changes throughout the thoracic
spine.
IMPRESSION: Right lower lobe pneumonia.  Stable cardiomegaly.  No evidence of
pulmonary edema.

## 2010-05-21 ENCOUNTER — Encounter: Payer: Self-pay | Admitting: Physician Assistant

## 2010-05-22 ENCOUNTER — Encounter: Payer: 59 | Admitting: Physician Assistant

## 2010-05-25 IMAGING — CR DG CHEST 1V PORT
1 series · 1 of 1 positions shown · non-contrast
Comparison: 02/29/2008

CLINICAL DATA: Pneumonia.  Congestion.

PORTABLE CHEST - 1 VIEW

[AP]
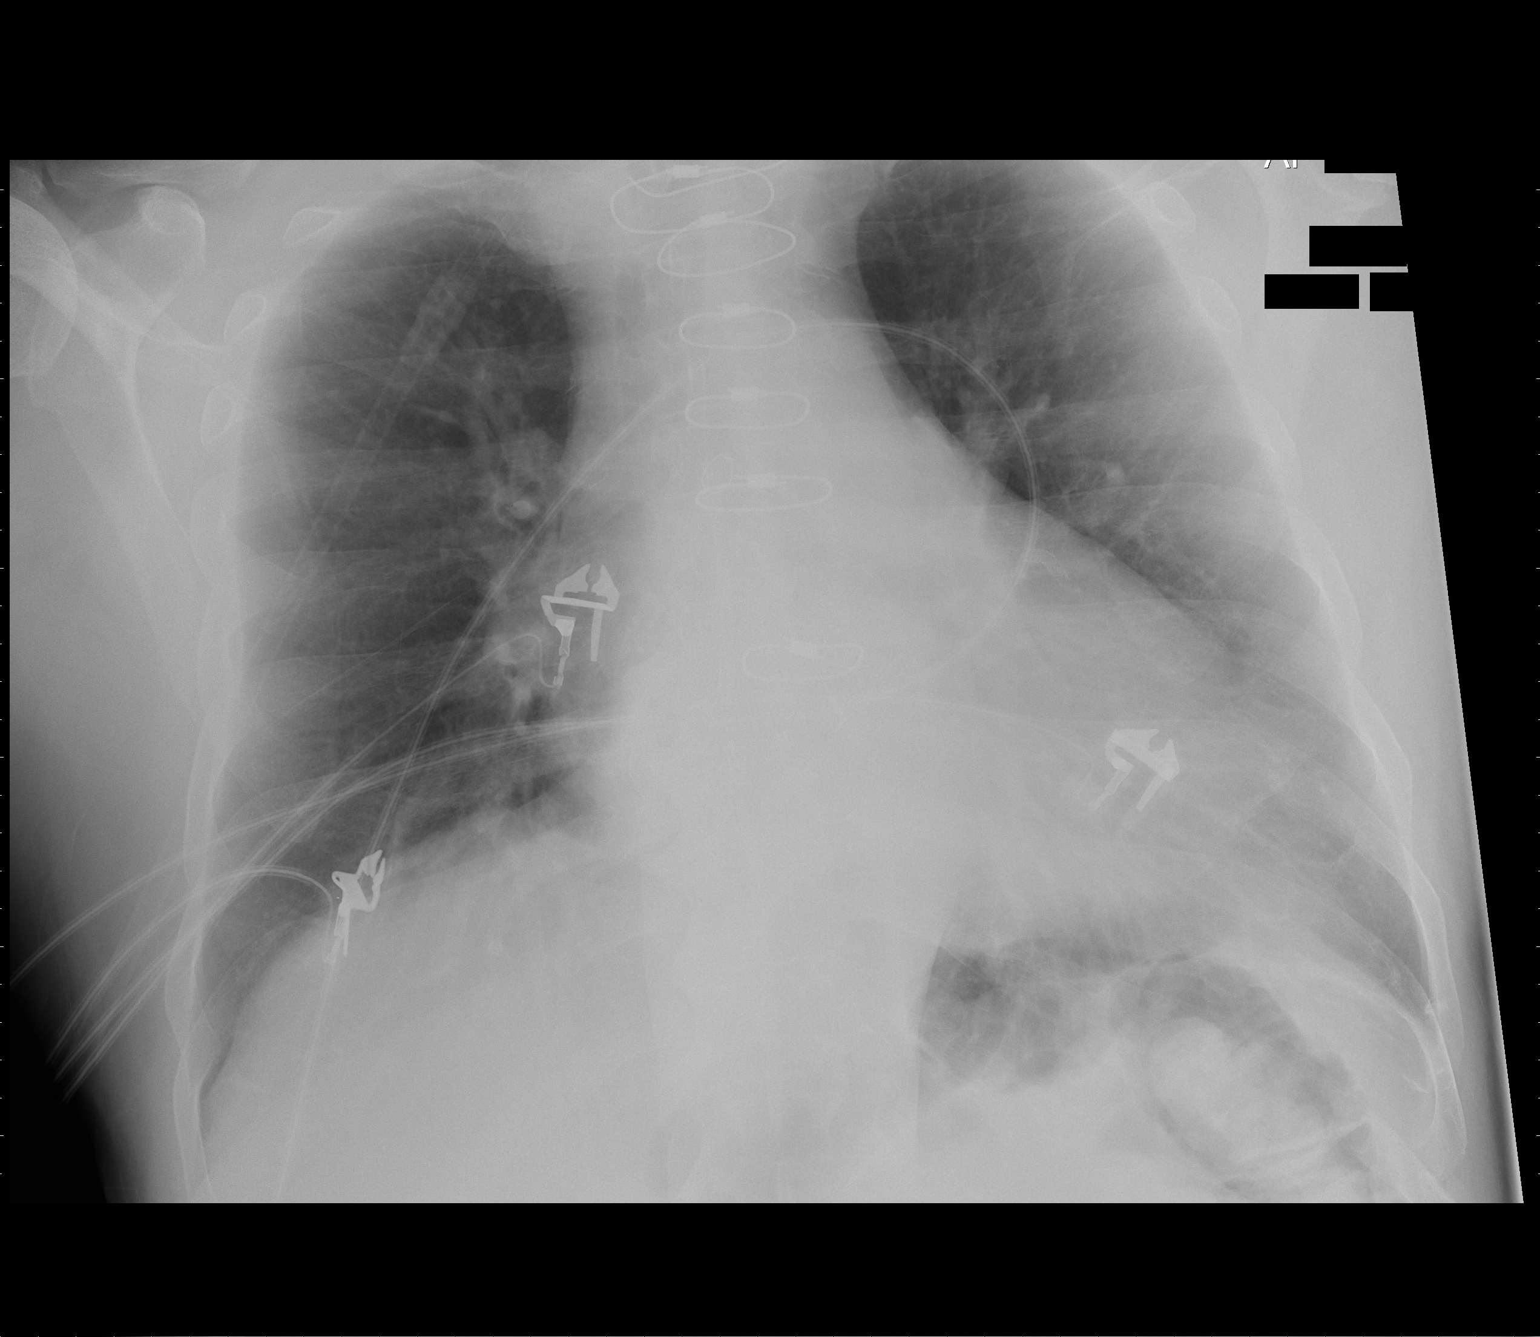

[1 of 1 positions shown; findings below may reference images not displayed]

FINDINGS: Airspace disease in the right lower lobe has improved.
Cardiomegaly persists unchanged.  No evidence of failure.  No
pleural effusions are identified.  Mediastinal contours unchanged.
IMPRESSION: 1.  Improved right lower lobe airspace disease, likely representing
resolving pneumonia or improved atypical edema.
2.  Cardiomegaly without evidence of failure.
3.  Postsurgical changes of the chest.

## 2010-05-27 DIAGNOSIS — M5430 Sciatica, unspecified side: Secondary | ICD-10-CM

## 2010-05-27 DIAGNOSIS — N183 Chronic kidney disease, stage 3 unspecified: Secondary | ICD-10-CM

## 2010-05-27 HISTORY — DX: Sciatica, unspecified side: M54.30

## 2010-05-27 HISTORY — DX: Chronic kidney disease, stage 3 unspecified: N18.30

## 2010-05-28 LAB — BRAIN NATRIURETIC PEPTIDE: Pro B Natriuretic peptide (BNP): 2323 pg/mL — ABNORMAL HIGH (ref 0.0–100.0)

## 2010-05-28 LAB — BASIC METABOLIC PANEL
CO2: 34 mEq/L — ABNORMAL HIGH (ref 19–32)
Chloride: 94 mEq/L — ABNORMAL LOW (ref 96–112)
GFR calc Af Amer: >60 mL/min (ref >60)
Glucose, Bld: 130 mg/dL — ABNORMAL HIGH (ref 70–99)
Sodium: 138 mEq/L (ref 135–145)

## 2010-05-28 LAB — GLUCOSE, CAPILLARY: Glucose-Capillary: 107 mg/dL — ABNORMAL HIGH (ref 70–99)

## 2010-05-28 LAB — PROTIME-INR: INR: 1.7 — ABNORMAL HIGH (ref 0.00–1.49)

## 2010-05-28 LAB — CBC
HCT: 29.2 % — ABNORMAL LOW (ref 39.0–52.0)
Hemoglobin: 9.4 g/dL — ABNORMAL LOW (ref 13.0–17.0)
MCHC: 32.1 g/dL (ref 30.0–36.0)
MCV: 91.1 fL (ref 78.0–100.0)
RBC: 3.21 MIL/uL — ABNORMAL LOW (ref 4.22–5.81)

## 2010-05-29 LAB — GLUCOSE, CAPILLARY
Glucose-Capillary: 123 mg/dL — ABNORMAL HIGH (ref 70–99)
Glucose-Capillary: 132 mg/dL — ABNORMAL HIGH (ref 70–99)
Glucose-Capillary: 141 mg/dL — ABNORMAL HIGH (ref 70–99)
Glucose-Capillary: 141 mg/dL — ABNORMAL HIGH (ref 70–99)
Glucose-Capillary: 163 mg/dL — ABNORMAL HIGH (ref 70–99)
Glucose-Capillary: 165 mg/dL — ABNORMAL HIGH (ref 70–99)
Glucose-Capillary: 173 mg/dL — ABNORMAL HIGH (ref 70–99)
Glucose-Capillary: 115 mg/dL — ABNORMAL HIGH (ref 70–99)
Glucose-Capillary: 124 mg/dL — ABNORMAL HIGH (ref 70–99)
Glucose-Capillary: 127 mg/dL — ABNORMAL HIGH (ref 70–99)
Glucose-Capillary: 129 mg/dL — ABNORMAL HIGH (ref 70–99)
Glucose-Capillary: 131 mg/dL — ABNORMAL HIGH (ref 70–99)
Glucose-Capillary: 137 mg/dL — ABNORMAL HIGH (ref 70–99)
Glucose-Capillary: 138 mg/dL — ABNORMAL HIGH (ref 70–99)
Glucose-Capillary: 152 mg/dL — ABNORMAL HIGH (ref 70–99)
Glucose-Capillary: 153 mg/dL — ABNORMAL HIGH (ref 70–99)
Glucose-Capillary: 163 mg/dL — ABNORMAL HIGH (ref 70–99)
Glucose-Capillary: 168 mg/dL — ABNORMAL HIGH (ref 70–99)
Glucose-Capillary: 176 mg/dL — ABNORMAL HIGH (ref 70–99)
Glucose-Capillary: 180 mg/dL — ABNORMAL HIGH (ref 70–99)
Glucose-Capillary: 189 mg/dL — ABNORMAL HIGH (ref 70–99)
Glucose-Capillary: 197 mg/dL — ABNORMAL HIGH (ref 70–99)
Glucose-Capillary: 209 mg/dL — ABNORMAL HIGH (ref 70–99)
Glucose-Capillary: 95 mg/dL (ref 70–99)

## 2010-05-29 LAB — CBC
HCT: 27.9 % — ABNORMAL LOW (ref 39.0–52.0)
HCT: 30.1 % — ABNORMAL LOW (ref 39.0–52.0)
HCT: 35.4 % — ABNORMAL LOW (ref 39.0–52.0)
Hemoglobin: 8.9 g/dL — ABNORMAL LOW (ref 13.0–17.0)
Hemoglobin: 9.6 g/dL — ABNORMAL LOW (ref 13.0–17.0)
Hemoglobin: 10.2 g/dL — ABNORMAL LOW (ref 13.0–17.0)
Hemoglobin: 11.5 g/dL — ABNORMAL LOW (ref 13.0–17.0)
MCHC: 32 g/dL (ref 30.0–36.0)
MCV: 92.7 fL (ref 78.0–100.0)
Platelets: 216 10*3/uL (ref 150–400)
Platelets: 230 10*3/uL (ref 150–400)
RBC: 3.4 MIL/uL — ABNORMAL LOW (ref 4.22–5.81)
RBC: 3.82 MIL/uL — ABNORMAL LOW (ref 4.22–5.81)
RDW: 16.8 % — ABNORMAL HIGH (ref 11.5–15.5)
WBC: 6.8 10*3/uL (ref 4.0–10.5)
WBC: 6.4 10*3/uL (ref 4.0–10.5)
WBC: 7 10*3/uL (ref 4.0–10.5)

## 2010-05-29 LAB — HEPATIC FUNCTION PANEL
ALT: 31 U/L (ref 0–53)
Albumin: 3.6 g/dL (ref 3.5–5.2)
Alkaline Phosphatase: 87 U/L (ref 39–117)
Indirect Bilirubin: 0.7 mg/dL (ref 0.3–0.9)
Total Bilirubin: 0.9 mg/dL (ref 0.3–1.2)
Total Protein: 6 g/dL (ref 6.0–8.3)

## 2010-05-29 LAB — URINALYSIS, ROUTINE W REFLEX MICROSCOPIC
Bilirubin Urine: NEGATIVE
Glucose, UA: NEGATIVE mg/dL
Ketones, ur: NEGATIVE mg/dL
Ketones, ur: NEGATIVE mg/dL
Nitrite: NEGATIVE
Protein, ur: NEGATIVE mg/dL
Protein, ur: NEGATIVE mg/dL
Urobilinogen, UA: 0.2 mg/dL (ref 0.0–1.0)
pH: 5 (ref 5.0–8.0)

## 2010-05-29 LAB — BASIC METABOLIC PANEL
BUN: 44 mg/dL — ABNORMAL HIGH (ref 6–23)
BUN: 37 mg/dL — ABNORMAL HIGH (ref 6–23)
BUN: 41 mg/dL — ABNORMAL HIGH (ref 6–23)
CO2: 29 mEq/L (ref 19–32)
CO2: 30 mEq/L (ref 19–32)
CO2: 31 mEq/L (ref 19–32)
Calcium: 9 mg/dL (ref 8.4–10.5)
Calcium: 9.3 mg/dL (ref 8.4–10.5)
Calcium: 9.8 mg/dL (ref 8.4–10.5)
Chloride: 100 mEq/L (ref 96–112)
Chloride: 102 mEq/L (ref 96–112)
Chloride: 95 mEq/L — ABNORMAL LOW (ref 96–112)
Chloride: 98 mEq/L (ref 96–112)
Creatinine, Ser: 1.2 mg/dL (ref 0.4–1.5)
Creatinine, Ser: 1.3 mg/dL (ref 0.4–1.5)
Creatinine, Ser: 1.32 mg/dL (ref 0.4–1.5)
Creatinine, Ser: 1.37 mg/dL (ref 0.4–1.5)
GFR calc Af Amer: 55 mL/min — ABNORMAL LOW (ref >60)
GFR calc Af Amer: 57 mL/min — ABNORMAL LOW (ref >60)
GFR calc Af Amer: >60 mL/min (ref >60)
GFR calc Af Amer: >60 mL/min (ref >60)
GFR calc non Af Amer: 45 mL/min — ABNORMAL LOW (ref >60)
GFR calc non Af Amer: 52 mL/min — ABNORMAL LOW (ref >60)
GFR calc non Af Amer: 56 mL/min — ABNORMAL LOW (ref >60)
GFR calc non Af Amer: >60 mL/min (ref >60)
Glucose, Bld: 119 mg/dL — ABNORMAL HIGH (ref 70–99)
Glucose, Bld: 140 mg/dL — ABNORMAL HIGH (ref 70–99)
Glucose, Bld: 140 mg/dL — ABNORMAL HIGH (ref 70–99)
Potassium: 4.1 mEq/L (ref 3.5–5.1)
Potassium: 4.1 mEq/L (ref 3.5–5.1)
Potassium: 4.1 mEq/L (ref 3.5–5.1)
Potassium: 4.2 mEq/L (ref 3.5–5.1)
Potassium: 4.5 mEq/L (ref 3.5–5.1)
Sodium: 137 mEq/L (ref 135–145)
Sodium: 141 mEq/L (ref 135–145)
Sodium: 136 mEq/L (ref 135–145)
Sodium: 137 mEq/L (ref 135–145)
Sodium: 139 mEq/L (ref 135–145)

## 2010-05-29 LAB — CARDIAC PANEL(CRET KIN+CKTOT+MB+TROPI)
Relative Index: INVALID (ref 0.0–2.5)
Relative Index: INVALID (ref 0.0–2.5)
Total CK: 45 U/L (ref 7–232)
Total CK: 47 U/L (ref 7–232)
Troponin I: 0.06 ng/mL (ref 0.00–0.06)

## 2010-05-29 LAB — DIFFERENTIAL
Basophils Absolute: 0 10*3/uL (ref 0.0–0.1)
Basophils Relative: 0 % (ref 0–1)
Eosinophils Absolute: 0.1 10*3/uL (ref 0.0–0.7)
Eosinophils Relative: 1 % (ref 0–5)
Eosinophils Relative: 1 % (ref 0–5)
Lymphocytes Relative: 12 % (ref 12–46)
Lymphs Abs: 0.8 10*3/uL (ref 0.7–4.0)
Monocytes Absolute: 0.5 10*3/uL (ref 0.1–1.0)
Monocytes Absolute: 0.5 10*3/uL (ref 0.1–1.0)
Monocytes Relative: 7 % (ref 3–12)

## 2010-05-29 LAB — POCT CARDIAC MARKERS
CKMB, poc: 3 ng/mL (ref 1.0–8.0)
Myoglobin, poc: 233 ng/mL (ref 12–200)
Troponin i, poc: <0.05 ng/mL (ref 0.00–0.09)

## 2010-05-29 LAB — COMPREHENSIVE METABOLIC PANEL
ALT: 28 U/L (ref 0–53)
AST: 26 U/L (ref 0–37)
Alkaline Phosphatase: 88 U/L (ref 39–117)
CO2: 32 mEq/L (ref 19–32)
Calcium: 9.2 mg/dL (ref 8.4–10.5)
Chloride: 98 mEq/L (ref 96–112)
GFR calc Af Amer: 57 mL/min — ABNORMAL LOW (ref >60)
GFR calc non Af Amer: 47 mL/min — ABNORMAL LOW (ref >60)
Glucose, Bld: 146 mg/dL — ABNORMAL HIGH (ref 70–99)
Potassium: 4.4 mEq/L (ref 3.5–5.1)
Sodium: 139 mEq/L (ref 135–145)

## 2010-05-29 LAB — LIPID PANEL: VLDL: 25 mg/dL (ref 0–40)

## 2010-05-29 LAB — PROTIME-INR
INR: 2.5 — ABNORMAL HIGH (ref 0.00–1.49)
INR: 1.9 — ABNORMAL HIGH (ref 0.00–1.49)
Prothrombin Time: 24.1 seconds — ABNORMAL HIGH (ref 11.6–15.2)
Prothrombin Time: 20.6 seconds — ABNORMAL HIGH (ref 11.6–15.2)
Prothrombin Time: 21.5 seconds — ABNORMAL HIGH (ref 11.6–15.2)
Prothrombin Time: 22.9 seconds — ABNORMAL HIGH (ref 11.6–15.2)
Prothrombin Time: 27.2 seconds — ABNORMAL HIGH (ref 11.6–15.2)

## 2010-05-29 LAB — BRAIN NATRIURETIC PEPTIDE
Pro B Natriuretic peptide (BNP): 3012 pg/mL — ABNORMAL HIGH (ref 0.0–100.0)
Pro B Natriuretic peptide (BNP): 1521 pg/mL — ABNORMAL HIGH (ref 0.0–100.0)
Pro B Natriuretic peptide (BNP): 1705 pg/mL — ABNORMAL HIGH (ref 0.0–100.0)
Pro B Natriuretic peptide (BNP): 1846 pg/mL — ABNORMAL HIGH (ref 0.0–100.0)
Pro B Natriuretic peptide (BNP): 2133 pg/mL — ABNORMAL HIGH (ref 0.0–100.0)
Pro B Natriuretic peptide (BNP): 2449 pg/mL — ABNORMAL HIGH (ref 0.0–100.0)

## 2010-05-29 LAB — APTT: aPTT: 51 seconds — ABNORMAL HIGH (ref 24–37)

## 2010-05-29 LAB — TSH: TSH: 1.739 u[IU]/mL (ref 0.350–4.500)

## 2010-05-30 LAB — GLUCOSE, CAPILLARY
Glucose-Capillary: 132 mg/dL — ABNORMAL HIGH (ref 70–99)
Glucose-Capillary: 139 mg/dL — ABNORMAL HIGH (ref 70–99)

## 2010-05-31 LAB — GLUCOSE, CAPILLARY
Glucose-Capillary: 131 mg/dL — ABNORMAL HIGH (ref 70–99)
Glucose-Capillary: 165 mg/dL — ABNORMAL HIGH (ref 70–99)
Glucose-Capillary: 104 mg/dL — ABNORMAL HIGH (ref 70–99)
Glucose-Capillary: 87 mg/dL (ref 70–99)
Glucose-Capillary: 94 mg/dL (ref 70–99)

## 2010-06-08 LAB — CBC
HCT: 32.2 % — ABNORMAL LOW (ref 39.0–52.0)
HCT: 34.9 % — ABNORMAL LOW (ref 39.0–52.0)
HCT: 29.5 % — ABNORMAL LOW (ref 39.0–52.0)
HCT: 30.4 % — ABNORMAL LOW (ref 39.0–52.0)
HCT: 34.3 % — ABNORMAL LOW (ref 39.0–52.0)
Hemoglobin: 10.1 g/dL — ABNORMAL LOW (ref 13.0–17.0)
Hemoglobin: 10.4 g/dL — ABNORMAL LOW (ref 13.0–17.0)
Hemoglobin: 10.6 g/dL — ABNORMAL LOW (ref 13.0–17.0)
Hemoglobin: 10.7 g/dL — ABNORMAL LOW (ref 13.0–17.0)
Hemoglobin: 10.7 g/dL — ABNORMAL LOW (ref 13.0–17.0)
Hemoglobin: 11.4 g/dL — ABNORMAL LOW (ref 13.0–17.0)
Hemoglobin: 10.9 g/dL — ABNORMAL LOW (ref 13.0–17.0)
Hemoglobin: 11.1 g/dL — ABNORMAL LOW (ref 13.0–17.0)
Hemoglobin: 9.6 g/dL — ABNORMAL LOW (ref 13.0–17.0)
MCHC: 31.7 g/dL (ref 30.0–36.0)
MCHC: 32 g/dL (ref 30.0–36.0)
MCHC: 32.3 g/dL (ref 30.0–36.0)
MCHC: 31.9 g/dL (ref 30.0–36.0)
MCHC: 32.3 g/dL (ref 30.0–36.0)
MCHC: 32.5 g/dL (ref 30.0–36.0)
MCHC: 32.7 g/dL (ref 30.0–36.0)
MCV: 85.9 fL (ref 78.0–100.0)
MCV: 87.5 fL (ref 78.0–100.0)
MCV: 85.8 fL (ref 78.0–100.0)
MCV: 87.4 fL (ref 78.0–100.0)
MCV: 87.7 fL (ref 78.0–100.0)
Platelets: 157 10*3/uL (ref 150–400)
Platelets: 177 10*3/uL (ref 150–400)
Platelets: 202 10*3/uL (ref 150–400)
RBC: 3.5 MIL/uL — ABNORMAL LOW (ref 4.22–5.81)
RBC: 3.55 MIL/uL — ABNORMAL LOW (ref 4.22–5.81)
RBC: 3.83 MIL/uL — ABNORMAL LOW (ref 4.22–5.81)
RBC: 3.9 MIL/uL — ABNORMAL LOW (ref 4.22–5.81)
RBC: 4.08 MIL/uL — ABNORMAL LOW (ref 4.22–5.81)
RBC: 4.11 MIL/uL — ABNORMAL LOW (ref 4.22–5.81)
RBC: 3.74 MIL/uL — ABNORMAL LOW (ref 4.22–5.81)
RBC: 3.88 MIL/uL — ABNORMAL LOW (ref 4.22–5.81)
RBC: 3.93 MIL/uL — ABNORMAL LOW (ref 4.22–5.81)
RDW: 16.8 % — ABNORMAL HIGH (ref 11.5–15.5)
RDW: 17.1 % — ABNORMAL HIGH (ref 11.5–15.5)
RDW: 17.2 % — ABNORMAL HIGH (ref 11.5–15.5)
RDW: 17.3 % — ABNORMAL HIGH (ref 11.5–15.5)
RDW: 17.1 % — ABNORMAL HIGH (ref 11.5–15.5)
RDW: 17.5 % — ABNORMAL HIGH (ref 11.5–15.5)
RDW: 18.1 % — ABNORMAL HIGH (ref 11.5–15.5)
WBC: 6.1 10*3/uL (ref 4.0–10.5)
WBC: 7.2 10*3/uL (ref 4.0–10.5)
WBC: 7.9 10*3/uL (ref 4.0–10.5)
WBC: 8.6 10*3/uL (ref 4.0–10.5)
WBC: 8.9 10*3/uL (ref 4.0–10.5)
WBC: 5.2 10*3/uL (ref 4.0–10.5)
WBC: 5.7 10*3/uL (ref 4.0–10.5)

## 2010-06-08 LAB — GLUCOSE, CAPILLARY
Glucose-Capillary: 112 mg/dL — ABNORMAL HIGH (ref 70–99)
Glucose-Capillary: 127 mg/dL — ABNORMAL HIGH (ref 70–99)
Glucose-Capillary: 129 mg/dL — ABNORMAL HIGH (ref 70–99)
Glucose-Capillary: 140 mg/dL — ABNORMAL HIGH (ref 70–99)
Glucose-Capillary: 140 mg/dL — ABNORMAL HIGH (ref 70–99)
Glucose-Capillary: 145 mg/dL — ABNORMAL HIGH (ref 70–99)
Glucose-Capillary: 153 mg/dL — ABNORMAL HIGH (ref 70–99)
Glucose-Capillary: 153 mg/dL — ABNORMAL HIGH (ref 70–99)
Glucose-Capillary: 159 mg/dL — ABNORMAL HIGH (ref 70–99)
Glucose-Capillary: 187 mg/dL — ABNORMAL HIGH (ref 70–99)
Glucose-Capillary: 211 mg/dL — ABNORMAL HIGH (ref 70–99)
Glucose-Capillary: 213 mg/dL — ABNORMAL HIGH (ref 70–99)
Glucose-Capillary: 221 mg/dL — ABNORMAL HIGH (ref 70–99)
Glucose-Capillary: 239 mg/dL — ABNORMAL HIGH (ref 70–99)
Glucose-Capillary: 240 mg/dL — ABNORMAL HIGH (ref 70–99)
Glucose-Capillary: 276 mg/dL — ABNORMAL HIGH (ref 70–99)
Glucose-Capillary: 294 mg/dL — ABNORMAL HIGH (ref 70–99)
Glucose-Capillary: 303 mg/dL — ABNORMAL HIGH (ref 70–99)
Glucose-Capillary: 103 mg/dL — ABNORMAL HIGH (ref 70–99)
Glucose-Capillary: 103 mg/dL — ABNORMAL HIGH (ref 70–99)
Glucose-Capillary: 111 mg/dL — ABNORMAL HIGH (ref 70–99)
Glucose-Capillary: 111 mg/dL — ABNORMAL HIGH (ref 70–99)
Glucose-Capillary: 111 mg/dL — ABNORMAL HIGH (ref 70–99)
Glucose-Capillary: 116 mg/dL — ABNORMAL HIGH (ref 70–99)
Glucose-Capillary: 117 mg/dL — ABNORMAL HIGH (ref 70–99)
Glucose-Capillary: 168 mg/dL — ABNORMAL HIGH (ref 70–99)
Glucose-Capillary: 217 mg/dL — ABNORMAL HIGH (ref 70–99)
Glucose-Capillary: 95 mg/dL (ref 70–99)

## 2010-06-08 LAB — BASIC METABOLIC PANEL
BUN: 21 mg/dL (ref 6–23)
BUN: 43 mg/dL — ABNORMAL HIGH (ref 6–23)
BUN: 24 mg/dL — ABNORMAL HIGH (ref 6–23)
BUN: 29 mg/dL — ABNORMAL HIGH (ref 6–23)
CO2: 34 mEq/L — ABNORMAL HIGH (ref 19–32)
CO2: 36 mEq/L — ABNORMAL HIGH (ref 19–32)
CO2: 36 mEq/L — ABNORMAL HIGH (ref 19–32)
CO2: 26 mEq/L (ref 19–32)
CO2: 28 mEq/L (ref 19–32)
Calcium: 8.6 mg/dL (ref 8.4–10.5)
Calcium: 8.6 mg/dL (ref 8.4–10.5)
Calcium: 8.7 mg/dL (ref 8.4–10.5)
Calcium: 9.1 mg/dL (ref 8.4–10.5)
Calcium: 8.7 mg/dL (ref 8.4–10.5)
Calcium: 9 mg/dL (ref 8.4–10.5)
Calcium: 9 mg/dL (ref 8.4–10.5)
Chloride: 90 mEq/L — ABNORMAL LOW (ref 96–112)
Chloride: 92 mEq/L — ABNORMAL LOW (ref 96–112)
Chloride: 95 mEq/L — ABNORMAL LOW (ref 96–112)
Chloride: 98 mEq/L (ref 96–112)
Chloride: 101 mEq/L (ref 96–112)
Chloride: 105 mEq/L (ref 96–112)
Creatinine, Ser: 0.86 mg/dL (ref 0.4–1.5)
Creatinine, Ser: 0.89 mg/dL (ref 0.4–1.5)
Creatinine, Ser: 1.1 mg/dL (ref 0.4–1.5)
Creatinine, Ser: 1.17 mg/dL (ref 0.4–1.5)
Creatinine, Ser: 1.18 mg/dL (ref 0.4–1.5)
Creatinine, Ser: 1.31 mg/dL (ref 0.4–1.5)
Creatinine, Ser: 1.05 mg/dL (ref 0.4–1.5)
GFR calc Af Amer: >60 mL/min (ref >60)
GFR calc Af Amer: >60 mL/min (ref >60)
GFR calc Af Amer: >60 mL/min (ref >60)
GFR calc Af Amer: >60 mL/min (ref >60)
GFR calc Af Amer: >60 mL/min (ref >60)
GFR calc Af Amer: >60 mL/min (ref >60)
GFR calc Af Amer: 59 mL/min — ABNORMAL LOW (ref >60)
GFR calc Af Amer: >60 mL/min (ref >60)
GFR calc non Af Amer: 53 mL/min — ABNORMAL LOW (ref >60)
GFR calc non Af Amer: >60 mL/min (ref >60)
GFR calc non Af Amer: >60 mL/min (ref >60)
GFR calc non Af Amer: >60 mL/min (ref >60)
GFR calc non Af Amer: >60 mL/min (ref >60)
GFR calc non Af Amer: >60 mL/min (ref >60)
GFR calc non Af Amer: >60 mL/min (ref >60)
Glucose, Bld: 137 mg/dL — ABNORMAL HIGH (ref 70–99)
Glucose, Bld: 96 mg/dL (ref 70–99)
Glucose, Bld: 128 mg/dL — ABNORMAL HIGH (ref 70–99)
Potassium: 3.6 mEq/L (ref 3.5–5.1)
Potassium: 3.7 mEq/L (ref 3.5–5.1)
Potassium: 3.9 mEq/L (ref 3.5–5.1)
Potassium: 4.4 mEq/L (ref 3.5–5.1)
Potassium: 4.4 mEq/L (ref 3.5–5.1)
Potassium: 4.9 mEq/L (ref 3.5–5.1)
Sodium: 138 mEq/L (ref 135–145)
Sodium: 138 mEq/L (ref 135–145)
Sodium: 140 mEq/L (ref 135–145)
Sodium: 140 mEq/L (ref 135–145)
Sodium: 139 mEq/L (ref 135–145)
Sodium: 140 mEq/L (ref 135–145)

## 2010-06-08 LAB — DIFFERENTIAL
Basophils Absolute: 0 10*3/uL (ref 0.0–0.1)
Basophils Absolute: 0 10*3/uL (ref 0.0–0.1)
Basophils Relative: 0 % (ref 0–1)
Basophils Relative: 0 % (ref 0–1)
Eosinophils Absolute: 0 10*3/uL (ref 0.0–0.7)
Eosinophils Absolute: 0 10*3/uL (ref 0.0–0.7)
Eosinophils Relative: 0 % (ref 0–5)
Lymphocytes Relative: 18 % (ref 12–46)
Lymphs Abs: 0.5 10*3/uL — ABNORMAL LOW (ref 0.7–4.0)
Monocytes Absolute: 0.5 10*3/uL (ref 0.1–1.0)
Monocytes Relative: 7 % (ref 3–12)
Neutro Abs: 4.8 10*3/uL (ref 1.7–7.7)
Neutrophils Relative %: 82 % — ABNORMAL HIGH (ref 43–77)
Neutrophils Relative %: 74 % (ref 43–77)

## 2010-06-08 LAB — PROTIME-INR
INR: 1 (ref 0.00–1.49)
INR: 1.1 (ref 0.00–1.49)
INR: 1.5 (ref 0.00–1.49)
Prothrombin Time: 13 seconds (ref 11.6–15.2)
Prothrombin Time: 14.1 seconds (ref 11.6–15.2)
Prothrombin Time: 14.7 seconds (ref 11.6–15.2)
Prothrombin Time: 25.9 seconds — ABNORMAL HIGH (ref 11.6–15.2)

## 2010-06-08 LAB — CK TOTAL AND CKMB (NOT AT ARMC)
CK, MB: 11.3 ng/mL — ABNORMAL HIGH (ref 0.3–4.0)
CK, MB: 18.6 ng/mL — ABNORMAL HIGH (ref 0.3–4.0)
CK, MB: 2.9 ng/mL (ref 0.3–4.0)
Relative Index: 1.5 (ref 0.0–2.5)
Relative Index: 2.7 — ABNORMAL HIGH (ref 0.0–2.5)
Relative Index: 4.5 — ABNORMAL HIGH (ref 0.0–2.5)
Total CK: 172 U/L (ref 7–232)
Total CK: 321 U/L — ABNORMAL HIGH (ref 7–232)

## 2010-06-08 LAB — URINALYSIS, ROUTINE W REFLEX MICROSCOPIC
Bilirubin Urine: NEGATIVE
Glucose, UA: NEGATIVE mg/dL
Glucose, UA: NEGATIVE mg/dL
Ketones, ur: NEGATIVE mg/dL
Ketones, ur: NEGATIVE mg/dL
Protein, ur: NEGATIVE mg/dL
pH: 5 (ref 5.0–8.0)
pH: 5 (ref 5.0–8.0)

## 2010-06-08 LAB — COMPREHENSIVE METABOLIC PANEL
ALT: 30 U/L (ref 0–53)
ALT: 22 U/L (ref 0–53)
AST: 34 U/L (ref 0–37)
Alkaline Phosphatase: 56 U/L (ref 39–117)
Alkaline Phosphatase: 59 U/L (ref 39–117)
CO2: 22 mEq/L (ref 19–32)
CO2: 24 mEq/L (ref 19–32)
Chloride: 100 mEq/L (ref 96–112)
Chloride: 109 mEq/L (ref 96–112)
GFR calc Af Amer: >60 mL/min (ref >60)
GFR calc non Af Amer: >60 mL/min (ref >60)
GFR calc non Af Amer: >60 mL/min (ref >60)
Glucose, Bld: 81 mg/dL (ref 70–99)
Potassium: 5 mEq/L (ref 3.5–5.1)
Sodium: 130 mEq/L — ABNORMAL LOW (ref 135–145)
Sodium: 143 mEq/L (ref 135–145)
Total Bilirubin: 0.6 mg/dL (ref 0.3–1.2)
Total Protein: 6 g/dL (ref 6.0–8.3)

## 2010-06-08 LAB — LEGIONELLA ANTIGEN, URINE

## 2010-06-08 LAB — CARDIAC PANEL(CRET KIN+CKTOT+MB+TROPI)
CK, MB: 1.9 ng/mL (ref 0.3–4.0)
CK, MB: 14.6 ng/mL — ABNORMAL HIGH (ref 0.3–4.0)
CK, MB: 2.3 ng/mL (ref 0.3–4.0)
CK, MB: 2.6 ng/mL (ref 0.3–4.0)
CK, MB: 2.3 ng/mL (ref 0.3–4.0)
Relative Index: INVALID (ref 0.0–2.5)
Relative Index: INVALID (ref 0.0–2.5)
Relative Index: INVALID (ref 0.0–2.5)
Relative Index: INVALID (ref 0.0–2.5)
Total CK: 243 U/L — ABNORMAL HIGH (ref 7–232)
Total CK: 310 U/L — ABNORMAL HIGH (ref 7–232)
Total CK: 43 U/L (ref 7–232)
Total CK: 49 U/L (ref 7–232)
Total CK: 41 U/L (ref 7–232)
Troponin I: 0.18 ng/mL — ABNORMAL HIGH (ref 0.00–0.06)
Troponin I: 0.19 ng/mL — ABNORMAL HIGH (ref 0.00–0.06)
Troponin I: 0.02 ng/mL (ref 0.00–0.06)

## 2010-06-08 LAB — CULTURE, BLOOD (ROUTINE X 2): Culture: NO GROWTH

## 2010-06-08 LAB — BRAIN NATRIURETIC PEPTIDE
Pro B Natriuretic peptide (BNP): 1063 pg/mL — ABNORMAL HIGH (ref 0.0–100.0)
Pro B Natriuretic peptide (BNP): 1463 pg/mL — ABNORMAL HIGH (ref 0.0–100.0)
Pro B Natriuretic peptide (BNP): 505 pg/mL — ABNORMAL HIGH (ref 0.0–100.0)
Pro B Natriuretic peptide (BNP): 562 pg/mL — ABNORMAL HIGH (ref 0.0–100.0)
Pro B Natriuretic peptide (BNP): 784 pg/mL — ABNORMAL HIGH (ref 0.0–100.0)
Pro B Natriuretic peptide (BNP): 1028 pg/mL — ABNORMAL HIGH (ref 0.0–100.0)
Pro B Natriuretic peptide (BNP): 1308 pg/mL — ABNORMAL HIGH (ref 0.0–100.0)
Pro B Natriuretic peptide (BNP): 339 pg/mL — ABNORMAL HIGH (ref 0.0–100.0)

## 2010-06-08 LAB — BLOOD GAS, ARTERIAL
Bicarbonate: 21.1 mEq/L (ref 20.0–24.0)
O2 Saturation: 92.7 %
pO2, Arterial: 66.4 mmHg — ABNORMAL LOW (ref 80.0–100.0)

## 2010-06-08 LAB — STREP A DNA PROBE

## 2010-06-08 LAB — APTT
aPTT: 26 seconds (ref 24–37)
aPTT: 28 seconds (ref 24–37)

## 2010-06-08 LAB — FOLATE: Folate: >20 ng/mL

## 2010-06-08 LAB — HEPARIN LEVEL (UNFRACTIONATED): Heparin Unfractionated: 0.19 IU/mL — ABNORMAL LOW (ref 0.30–0.70)

## 2010-06-08 LAB — TROPONIN I
Troponin I: 0.06 ng/mL (ref 0.00–0.06)
Troponin I: 0.59 ng/mL (ref 0.00–0.06)

## 2010-06-25 ENCOUNTER — Encounter: Payer: 59 | Admitting: Internal Medicine

## 2010-06-26 ENCOUNTER — Encounter: Payer: Self-pay | Admitting: Internal Medicine

## 2010-07-07 NOTE — Consult Note (Signed)
Dustin [name], Dustin [name]:  [id number]   MEDICAL RECORD NO.:  [id number]          PATIENT TYPE:  INP   LOCATION:  4743                         FACILITY:  [facility]   PHYSICIAN:  Jordan Hawks. Elnoria Howard, MD    DATE OF BIRTH:  12/08/1927   DATE OF CONSULTATION:  10/04/2007  DATE OF DISCHARGE:                                 CONSULTATION   REASON FOR CONSULTATION:  Chest pain and dysphagia.   REFERRING PHYSICIAN:  Nanetta Batty, MD   HISTORY OF PRESENT ILLNESS:  This is a 77 year old gentleman with a past  medical history of coronary artery disease status post stenting,  aortobifemoral grafting in 1994, status post left hemispheric stroke in  1998, spinal stenosis, diabetes, hypertension, and hyperlipidemia who  was admitted to the hospital with complaints of chest pain.  The patient  has had a significant amount of chest pain within the recent past.  He  had undergone a cardiac catheterization in July for preoperative workup  in regards to his spinal stenosis, however, abnormalities were  discovered in regards to his cardiac issues and subsequently underwent a  catheterization and stenting was performed at that time.  Unfortunately,  he developed a stenosis in one of the stents and underwent a repeat  catheterization with opening of the recently placed stent.  The patient  was also noted to have an SVG graft that was occluded however, further  intervention would be done at a later date in regards to his acute  etiology.  At this time, the patient underwent a repeat catheterization  and the SVG to the RCA was opened and since that time he denies having  any further chest pain.  However, there is a long history of  gastroesophageal reflux disease and Cardiology feels that there may be a  component exacerbating his chest pain type of symptoms.  Subsequently,  GI consultation was requested.  In the past, the patient reports having  a 20-year history of gastroesophageal reflux  disease and treatment with  Prilosec has been markedly beneficial as well as elevation of his head  at the bed.  The patient also underwent EGD examinations by Dr. Sherin Quarry  and Dr. Laural Benes about 10-12 years ago with findings of reflux disease  per his report.  Interestingly, the patient has a diagnosis of Parkinson  disease versus essential tremors and he feels that with the progression  of his disease, there might be difficulty with swallowing.  He does not  feel that his current chest pain symptoms are attributable to his  gastroesophageal reflux disease.   Past medical history and past surgical history is as stated above.   Family history is noncontributory.   Social history is negative for alcohol, tobacco or illicit drug use.   Review of systems is as stated above in the history of present illness,  otherwise, negative.   MEDICATIONS:  1. Aspirin 325 mg p.o. daily  2. Angiomax 250 mg IV drip.  3. Plavix 75 mg p.o. daily  4. Valium 5 mg p.o. daily.  5. Colace 100 mg p.o. b.i.d.  6.  Folic acid 1 mg p.o. daily  7. Heparin.  8. Sliding scale insulin.  9. Metoprolol 25 mg p.o. b.i.d.  10.Protonix 40 mg p.o. daily  11.Ranexa 500 mg p.o. b.i.d.  12.Crestor 10 mg p.o. daily.  13.Alprazolam 0.25 mg p.o. b.i.d. p.r.n.  14.Loperamide 2-4 mg p.o. p.r.n.  15.Zofran 4 mg IV q.6 hours.  16.Ambien 5 mg one p.o. at bedtime.   ALLERGIES:  PROPRANOLOL, CODEINE, METOPROLOL SUCCINATE and NSAIDS.   PHYSICAL EXAMINATION:  VITAL SIGNS:  Blood pressure is 146/62, heart  rate 62, respirations 20, temperature is afebrile.  GENERAL:  The patient is in no acute distress, alert and oriented.  HEENT:  Normocephalic, atraumatic.  Extraocular muscles intact.  NECK:  Supple.  No lymphadenopathy.  LUNGS:  Clear to auscultation bilaterally.  CARDIOVASCULAR:  Regular rate and rhythm.  ABDOMEN:  Flat, soft, nontender, and nondistended.  EXTREMITIES:  No clubbing, cyanosis or edema.   LABORATORY  VALUES:  White blood cell count is 5.5, hemoglobin 1.5, MCV  is 90.3, and platelets at 160.  Sodium 139, potassium 4.7, chloride 106,  CO2 28, glucose 102, BUN is 14, and creatinine 1.0.   IMPRESSION:  1. Gastroesophageal reflux disease.  2. Dysphagia.  3. Coronary artery disease.   After evaluation of the patient, there is a component of dysphagia which  I feel is most likely a motility associated, however, given the long  history of his reflux disease and his current complaints of dysphagia,  repeat EGD is warranted at this time.  Unfortunately, no biopsies will  be able to be obtained if required as he is on anticoagulation.  Further  recommendations pending the findings of the EGD      Jordan Hawks. Elnoria Howard, MD  Electronically Signed     PDH/MEDQ  D:  10/04/2007  T:  10/05/2007  Job:  [id number]   cc:   Nanetta Batty, M.D.

## 2010-07-07 NOTE — Consult Note (Signed)
NAMEZACKARY, Dustin [name]:  [id number]   MEDICAL RECORD NO.:  [id number]          PATIENT TYPE:  INP   LOCATION:  6522                         FACILITY:  [facility]   PHYSICIAN:  Kalman Shan, MD   DATE OF BIRTH:  12/08/1927   DATE OF CONSULTATION:  02/29/2008  DATE OF DISCHARGE:                                 CONSULTATION   TIME OF EVALUATION:  9:45 a.m. to 10:45 a.m.  A 60 minute inpatient  pulmonary consultation.   REQUESTING PHYSICIAN:  Cristy Hilts. Jacinto Halim, MD., [facility].   REASON FOR CONSULTATION:  Right lower lobe pneumonia and continued  respiratory distress with wheezing.   HISTORY OF PRESENT ILLNESS:  Mr. Dustin [name] is a 77 year old male who  is a very limited remote smoker and with possible diagnosis of asthma in  the past, who on Wednesday of last week which is February 21, 2008 was  exposed to his 72 year old granddaughter who had a cough.  She stayed  there briefly overnight, then on Sunday on February 25, 2008, he was not  feeling well.  By February 26, 2008, Monday, he was having cough, wheezing  and some productive sputum that was yellow-tan.  On Tuesday, February 27, 2008, he developed a fever at home of 102.  Presented to his primary  care and was given Z-Pak, but had continued with significant cough that  did not resolve with Tessalon Perles and fever, so he presented to  Urgent Care and was then admitted to the hospital.  Chest x-ray showed  right lower lobe pneumonia.  He also had a fever at admission of 102 or  101.  He was started on ceftriaxone, azithromycin and Tamiflu.  For  wheezing on February 28, 2008 which was yesterday, he was started on Solu-  Medrol.   Since admission, his cough is slightly better.  His yellow-tan sputum is  not present any more.  His wheezing has improved but still present.  Dyspnea however, is only marginall better or unchanged.  Family is very  concerned and overall think that he is either  the same or maybe a little  bit better.  He was seen by Dr. Jacinto Halim this morning and pulmonary  consultation has been requested.  Of note, chest x-ray shows classic  right lower lobe pneumonia, but his BNP at admission was 1000 and  currently it is at 1200.  Family feels overall he is not improving as  fast as he should and they are wondering why he got sick.  He denies any  edema, chest pain, paroxysmal nocturnal dyspnea, hemoptysis, loss of  consciousness, syncope.   PAST MEDICAL HISTORY:  1. Includes non-ST elevation MI in July 2009.  2. Respiratory arrest following Versed, reversed without complication      in July 2009.  3. Coronary artery disease with PTCA and stent to left circumflex and      mid LAD followed by acute thrombus of the proximal left circumflex      with repeat cath and then stent placed.  4. History of CAD with  CABG and stent.  5. Peripheral arterial disease.  6. Hypertension.  7. Hyperlipidemia.  8. Diabetes.  9. History of mildly decreased left ventricular ejection fraction of      45% in July 2009 per Meriam Sprague the PA with [facility]      [facility].  This ejection fraction has since improved to normal.  10.Rectal hemorrhoids.  11.No formal diagnosis of COPD or asthma.  The wife thinks a couple of      years ago he had some asthma symptoms and was given Combivent.  12.Obstructive sleep apnea.  Refuses to wear his CPAP.  Not on oxygen      at home.   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  He is married.  Smoked for 20 years 3-4  cigarettes a day, but quit 30 years ago.  Does not drink other than  socially.  He is retired, has 5 children and other grandchildren.   MEDICATIONS:  1. Metformin.  2. Prilosec.  3. Plavix.  4. Zocor.  5. Zoloft.  6. Hydrochlorothiazide.  7. Aspirin.  8. Norvasc.  9. Spironolactone.  10.Lisinopril.  11.Metoprolol.  12.Gabapentin.  13.Lasix.  14.Folic  acid.  15.Vitamin E.  16.Nitroglycerin as needed.  17.Since admission to the hospital, he is on Tamiflu, ceftriaxone 1 gm      q.24 h and azithromycin 500 mg IV q.24 h.   ALLERGIES:  1. CODEINE.  2. NSAIDS.   REVIEW OF SYSTEMS:  Negative other than in history and physical.   FAMILY HISTORY:  Noncontributory.   PHYSICAL EXAMINATION:  VITAL SIGNS:  Temperature at admission on February 27, 2008 at night was 101.5.  Since admission, he has been afebrile.  T  current is 97.9, pulse of 67, respiratory rate 19, blood pressure  118/68, saturation 100% on 2 liters.  GENERAL:  Obese male seated in bed.  HEENT:  Upper airway Mullenparty  class 2.  He has a hoarse voice that  is new.  NECK:  Obese.  Greater than 3 fingerbreadths for hyoid mentum distance.  CENTRAL NERVOUS SYSTEM:  Alert and oriented x3.  Speech normal.  RESPIRATORY:  He gets dyspneic talking in full sentences.  Respiratory  rate 22.  No paradoxical breathing.  Bilateral extensive wheeze present.  ABDOMEN:  Soft, nontender.  EXTREMITIES:  No clubbing.  No pedal edema.  No cyanosis.  SKIN:  Intact.   DIAGNOSTICS:  Chest x-ray February 27, 2008 shows right lower lobe  pneumonia.   LABORATORY DATA:  ABG not done.  CBC white count is normal throughout  admission, today it is 7.2, hemoglobin 10.1, platelet count 160.  Chemistries essentially show sodium 130, potassium 5.1, chloride 100,  bicarb 22, glucose 200, BUN 36 and creatinine 1.4.  LFTs are normal.  Cardiac markers showed BNP of 200 today, at admission is 1000, troponin  of 0.58 and slightly elevated, this was on February 28, 2008.  At  admission, it was 0.06, therefore troponin has gone up.  Lactic acid on  February 27, 2008 was 2.0 and within normal limits.   ASSESSMENT/PLAN:  Dustin [name] clearly has had right lower lobe community-  acquired pneumonia.  I suspect this was following exposure to his  grandchild who was sick.  He is on appropriate treatment with  ceftriaxone,  azithromycin and Tamiflu.  He has improved somewhat since  admission in terms of his cough, wheezing and shortness of breath.  However, his rate of improvement has been slow both subjectively and  also my objective feeling.   I suspect that he has some ongoing congestive heart failure that is  impeding his improvement.  I say this because his Solu-Medrol has been  on board for at least 24 hours.  With this, his wheezing should have  improved substantially, but he continues to wheeze. In the context of a  BNP of 1200 and slightly elevated troponins this wheezing is probably  reflective of stress from the whole process.  I suspect he has had  pneumonia first and as a result has stress-related congestive heart  failure.   RECOMMENDATIONS:  I would continue his community-acquired pneumonia  treatment with Tamiflu, azithromycin and ceftriaxone.  The only change I  would make is to increase his ceftriaxone to ICU doses of 2 gm IV q.24  h. and to also increase the Solu-Medrol to 80 mg IV q.6 h.  I would also  change his Xopenex to albuterol nebulizer because his heart rate is  normal and make it q.2 h. scheduled for the first 24 hours and then  p.r.n. after that.  I would also make his Atrovent nebulizer q.4 h.  scheduled for the first 24 hours and then q.4 h. p.r.n.  I would  recommend checking ABGs just to know his respiratory status right now  and also getting a repeat chest x-ray today.   Also recommend optimizing his heart failure.  I have spoken to [place]  and we are going to put him on some aggressive diuresis with Lasix 40 mg  IV q.8 h. and then monitor the situation.   Thank you for this interesting consult.      Kalman Shan, MD  Electronically Signed     MR/MEDQ  D:  02/29/2008  T:  02/29/2008  Job:  [id number]   cc:   Cristy Hilts. Jacinto Halim, MD  Nanetta Batty, M.D.

## 2010-07-07 NOTE — Discharge Summary (Signed)
NAMEALVIN, [name]:  [id number]   MEDICAL RECORD NO.:  [id number]          PATIENT TYPE:  INP   LOCATION:  6522                         FACILITY:  [facility]   PHYSICIAN:  Isidor Holts, M.D.  DATE OF BIRTH:  12/08/1927   DATE OF ADMISSION:  02/27/2008  DATE OF DISCHARGE:                               DISCHARGE SUMMARY   INTERIM DISCHARGE SUMMARY:   PRIMARY MEDICAL DOCTOR:  Dr. Murray Hodgkins.   PRIMARY CARDIOLOGIST:  Dr. Nanetta Batty.   DISCHARGE DIAGNOSES:  1. Right lower lobe community-acquired pneumonia.  2. Congestive heart failure.  3. Non-ST elevation myocardial infarction.  4. Dyslipidemia.  5. Type 2 diabetes mellitus.  6. History of coronary artery disease, status post non-ST elevation      myocardial infarction in July 2009, status post percutaneous      transluminal coronary angioplasty and stent.  7. Hypertension.  8. Peripheral vascular disease.  9. Cerebrovascular disease, status post left cerebrovascular accident      in 1998.  10.Hemorrhoids.  11.Obstructive sleep apnea syndrome.  12.Spinal stenosis.  13.Parkinsonism.  14.History of postherpetic neuralgia.   DISCHARGE MEDICATIONS:  These will be listed in addendum at the  appropriate time by discharging MD.   PROCEDURES:  1. Chest x-ray dated February 27, 2008.  This showed right lower lobe      pneumonia, stable cardiomegaly.  No evidence of pulmonary edema.  2. Chest x-ray datedJanuary 7, 2010.  This showed stable enlargement      of cardiac silhouette, elevation of right hemidiaphragm with      atelectasis is the right base.  Patchy infiltrative density in the      right base as identified on previous study appears slightly      improved.  3. Chest x-ray dated March 03, 2008.  This showed improved right      lower lobe air space disease, likely representing resolving      pneumonia or improved atypical edema.  Cardiomegaly without      evidence of failure. Postsurgical  changes of the chest.   CONSULTATIONS:  1. Dr. Kalman Shan, pulmonary/critical care medicine.  2. Dr. Yates Decamp, [facility].  3. Dr. Nanetta Batty, [facility].   ADMISSION HISTORY:  As in H and P notes of February 27, 2008, dictated by  Dr. Ladell Pier.  However, in brief, this is a 77 year old male, with  known history of congestive cardiomyopathy, ejection fraction 45% on  July 2009, coronary artery disease status post CABG and stent, history  of peripheral vascular disease, status post revascularization procedure  in 1994, status post left CVA in 1998, spinal stenosis, type 2 diabetes  mellitus, hypertension, dyslipidemia, leukemia in remission,  postherpetic neuralgia, Parkinson's disease, presenting with shortness  of breath, productive cough and a temperature of 102.  He was seen by  his primary MD in a.m. of February 27, 2008, sent home on antibiotics,  but because of continued symptomatology, presented to the emergency  department, where he was found to have a temperature of 101.5.  Chest x-  ray  demonstrated right lower lobe pneumonia.  He was admitted for  further evaluation, investigation and management.   CLINICAL COURSE:  1. Right lower lobe community-acquired pneumonia.  For details of      presentation, refer to admission history above.  The patient was      placed on an intravenous combination of Rocephin and Azithromycin,      streroids, bronchodilators, as well as concomitant Tamiflu, and      because of the epidemiologic considerations, placed on droplet      isolation.  He showed steady clinical improvement.  The cough and      shortness of breath ameliorated.  He defervesced and there were no      further episodes of pyrexia during the course of his      hospitalization.  Physical well being improved.  Dr. Kalman Shan, pulmonologist, was consulted because of continued      wheeze.  For details of his  consultation, refer to consultation      notes of February 29, 2008.  He felt that the patient had      concomitant congestive heart failure based on continued      symptomatology, in spite of steroid treatment and antibiotics, as      well as an elevated BNP of 1200 and slightly elevated troponin.  He      optimized antibiotic and steroid treatment and recommended      cardiology input for management of congestive heart failure.   1. Congestive heart failure.  The patient had an elevated BNP.  He      does have a history of coronary artery disease, status post      previous MI.  Cardiology consultation was requested, which was      kindly provided by Dr. Yates Decamp and subsequently by Dr. Nanetta Batty.  The patient was placed on intravenous Lasix.  He was also      managed with ACE inhibitor, beta-blocker and Spironolactone.      Clinical response was steady and satisfactory.  As of March 05, 2008, the patient was no longer symptomatic and his BNP as of      March 04, 2008, had dropped down to 784.   1. Non-STEMI.  Unfortunately, the patient's cardiac enzymes showed a      steady increase, although there were no acute ischemic changes on      his EKG, peaking at 1.49.  These features were consistent non-      STEMI.  Per cardiology, catheterization is indicated and this has      been scheduled to be done on March 06, 2008.   1. Type 2 diabetes mellitus.  The patient pre-admission, was on      Metformin.  This was discontinued secondary to congestive heart      failure and also planned cardiac catheterization.  He was managed      with carbohydrate modified diet, sliding-scale insulin coverage and      Lantus during the course of this hospitalization.   1. Dyslipidemia.  The patient continues on statin treatment.   1. Obstructive sleep apnea syndrome.  The patient remains stable from      this view point.   1. Spinal stenosis.  This did not prove problematic.   1.  History of Parkinsonism.  This did not prove problematic during the      course of  this hospitalization.   DISPOSITION:  This will be elucidated in detail in addendum at the  appropriate time, by discharging MD.  However, the patient is clearly  nearing discharge.  Pneumonia has resolved.  Congestive heart failure is  compensated as of March 05, 2008, and definitive management of  coronary artery disease will depend on cardiac catheterization findings.  Final disposition will depend on cardiology recommendations.      Isidor Holts, M.D.  Electronically Signed     CO/MEDQ  D:  03/05/2008  T:  03/05/2008  Job:  [id number]   cc:   Lenon Curt. Chilton Si, M.D.  Nanetta Batty, M.D.

## 2010-07-07 NOTE — Discharge Summary (Signed)
NAMEKOLTIN, WEHMEYER NO.:  [id number]   MEDICAL RECORD NO.:  [id number]          PATIENT TYPE:  INP   LOCATION:  2012                         FACILITY:  [facility]   PHYSICIAN:  Nanetta Batty, M.D.   DATE OF BIRTH:  12/08/1927   DATE OF ADMISSION:  03/18/2008  DATE OF DISCHARGE:  03/22/2008                               DISCHARGE SUMMARY   DISCHARGE DIAGNOSES:  1. Shortness of breath, cough, secondary to #2.  2. Mild congestive heart failure, mild systolic heart failure.  3. Atrial fibrillation, paroxysmal, which was new for this patient,      maintaining sinus rhythm at discharge, converted with amiodarone.  4. Hypotension, borderline.  5. Bradycardia with medication adjustment.  6. Regurgitation of liquids some time after drinking.  7. Esophagogastroduodenoscopy, probable healing gastric ulcer and      gastric polyps.  8. Hemoccult-positive stools with colonoscopy revealing diverticula.  9. Vocal cord dysfunction with Botox injection at [facility] 5 to 6 years ago per patient.      a.     Patient's swallowing difficulties may be related to vocal       cord dysfunction, recommended to be followed up at [facility].  10.Mild chronic anemia.  11.Chronic leukemia history.  12.Diabetes mellitus 2.  13.Coronary artery disease with bypass grafting in 1995.  Last cath      January 2010 with occluded saphenous vein graft to the RCA and      patent LAD, and left circumflex stents.  He does have 99% diagonal      disease and his last stent was placed in July of 2009.  14.Wide-complex tachycardia on admission, no return of ventricular      tachycardia.  15.Questionable Parkinson disease.  16.Aortobifemoral bypass graft in the past.  17.History of spinal stenosis, followed by Dr. Venetia Maxon.  18.Recent pneumonia with recent hospitalization, March 08, 2008.  19.Questionable cough secondary to ACE inhibitor.  20.History of nephrolithiasis.  21.History of left brain cerebrovascular accident in 1998 without      residual.   DISCHARGE CONDITION:  Improved.   DISCHARGE MEDICATIONS:  1. Coumadin 5 mg tablets take a half-tablet daily starting on      Saturday.  2. Kapidex 30 mg one daily for 1 month then resume Prilosec.  3. Colace 100 mg capsules 2 daily.  4. MiraLax 17 g in liquid daily.  5. Amiodarone 200 mg 2 tablets twice a day for the atrial fib      prevention nowwill be adjusted in the office please note.  6. Plavix 75 mg 1 daily.  7. Folic acid 1 mg daily.  8. Furosemide 40 mg 1 daily.  9. Guaifenesin 600 mg 1 twice a day.  10.Iron 325 mg at bedtime.  11.Isosorbide mononitrate take whole tablet which is a new dose daily.  12.Claritin 10 mg daily as needed.  13.Glucophage 1000 mg twice a day.  14.Pravastatin 40 mg at bedtime.  15.Zoloft 100 mg a half-tablet daily.  16.Lopressor 50 mg a half-tablet twice a day.  17.Diovan 40  mg daily which is new to replace the Lotensin.  18.Glipizide 2.5 mg tablet 1/2-tab daily as before.  19.Vitamin D3 2000 units daily.  20.Vitamin E stop.  21.Combivent inhaler as needed.  22.Nitroglycerin 1/150 one under the tongue as needed for chest pain      as before.  23.Patient has not been on gabapentin secondary to did not tolerate,      there was a question on admission.  24.Have blood work done March 25, 2008, home health will draw.  25.If you have blood in your stools or urine, call Dr. Hazle Coca office.   DISCHARGE INSTRUCTIONS OTHERWISE:  1. Low-sodium, heart-healthy, diabetic diet.  2. Increase activity slowly.  3. Call if you have increased weakness or fatigue, or shortness of      breath.  4. See Dr. Allyson Sabal, March 27, 2008, at 10 a.m.  5. Stop aspirin, spironolactone, Protonix, lisinopril, vitamin E, and      amlodipine.   HISTORY OF PRESENT ILLNESS:  A 77 year old, white, married male followed  by Dr. Allyson Sabal and Dr. Elesa Massed, his primary care at [facility] in [place], with   multiple medical problems, bypass surgery in 1995, multiple  interventions since, last stents were placed in July of 2009.  He was  admitted January 2010 with a community-acquired pneumonia.  Pulmonary  felt he had mixed heart failure.  BNP was elevated.  He was diuresed and  actually cathed March 06, 2008.  He had a total occlusion of his old  diseased vein graft to the RCA and he had left-to-right collaterals.  The LAD and circumflex stents were patent and he had a 99% proximal  small diagonal.  Medical therapy was recommended.  EF was 45%.  Patient  was discharged.  He developed shortness of breath and cough after  discharge and some dyspnea on exertion.  He was seen in the office  March 18, 2008, without complaint.   Patient was admitted to rule out heart failure, to rule out reaction to  his ACE inhibitor though he had been on this for some time.  He was also  found to be in atrial fibrillation.  Pulse was elevated, tachycardic at  126.  He was admitted to [facility], given IV diuretics.  Chest  x-ray did not show any acute heart failure but patient improved  immediately with the shortness of breath and cough with the IV Lasix  alone.  Also, the stopping of the lisinopril though less than 24 hours  change in the cough.  I doubt it is the lisinopril.   Patient also during the hospitalization complained of dysphagia.  He  drinks liquids.  He can lie down hours later and it will come up in his  throat sometimes that he has to spew it out of his mouth.  GI consult  was obtained as well.  Patient was placed on IV heparin.  Coumadin was  started for the atrial fibrillation.  He was also placed on amiodarone  p.o.   Iron was restarted on the patient.   GI consult did find heme-positive stools.  He was then set up for EGD  and colonoscopy.  A swallowing study was done by speech therapy.   It may be he will need to be a candidate for outpatient speech therapy  as well.    The swallowing study revealed normal oropharyngeal swallow, brief pause  at top of the esophagus which clears with liquid wash or small bites.  Recommend regular diet with  thin liquids and continue management of  esophageal dysmotility.   Patient did convert to sinus rhythm by day 2 in the hospital and  maintained sinus rhythm though prior to discharge his heart rate did  drop down to lower during the night in the 40s therefore his Cardizem  was never started by mouth.  Patient continued to improve, was able to  ambulate without problems.  Initially, we had planned to do  Lovenox/Coumadin crossover but with the amiodarone on board his INR  immediately fell to 0.2 from 1.5 therefore by March 22, 2008, it was  felt he was ready for discharge home.  Reviewed instructions with  patient and his family.   His swallowing issues may continue to be a problem.  Dr. Elnoria Howard could see  him back in the office if he does not improve, as well as [facility].  He will follow up with Dr. Allyson Sabal as instructed.  When he was discharged, he was in sinus rhythm.  Please note, as well,  home health will be assisting the patient.      Darcella Gasman. Annie Paras, N.P.      Nanetta Batty, M.D.  Electronically Signed    LRI/MEDQ  D:  03/25/2008  T:  03/25/2008  Job:  [id number]   cc:   Nanetta Batty, M.D.  Dr. Elesa Massed, [facility], Verneda Skill D. Elnoria Howard, MD

## 2010-07-07 NOTE — Cardiovascular Report (Signed)
NAMEKASIN, TONKINSON NO.:  [id number]   MEDICAL RECORD NO.:  [id number]          PATIENT TYPE:  INP   LOCATION:  2927                         FACILITY:  [facility]   PHYSICIAN:  Nanetta Batty, M.D.   DATE OF BIRTH:  12/08/1927   DATE OF PROCEDURE:  DATE OF DISCHARGE:                            CARDIAC CATHETERIZATION   Mr. Mcgrady is a 77 year old gentleman with history of CAD, status post  bypass grafting in 1995.  He has had stenting of his LAD and circumflex  in the past.  His other problems include history of abdominal aortic  aneurysm, status post aortobifemoral bypass grafting in 1994.  Hypertension, hyperlipidemia, diabetes, and spinal stenosis followed by  Dr. Venetia Maxon.  He was cathed by Dr. Yates Decamp on September 04, 2007, revealing  high-grade disease in the LAD and circumflex both of which were stented  successfully.  He had high-grade ostial vein graft stenosis to his acute  marginal and PDA.  Access was a problem because of his aortobifemoral  bypass graft insertion.  The patient developed chest pain the night  after intervention and ultimately was brought back to the cath lab and  was found to have acute stent thrombosis of his circumflex stent, which  was restented.  He did have significant wall motion abnormalities in his  lateral wall.  He was admitted yesterday with unstable angina.  Ruled  out for myocardial infarction.  He was heparinized and was brought in  today for diagnostic coronary arteriography to define his anatomy, rule  out ischemic etiology, and to perform stenting of his acute marginal/PDA  sequential vein grafting.   DESCRIPTION OF PROCEDURE:  The patient was brought to the Second Floor  of [facility]iac Cath Lab in a postabsorptive state.  He was  premedicated with p.o. Valium.  His right groin was prepped and shaved  in usual sterile fashion.  Xylocaine 1% was used for local anesthesia.  Initially, a 5-French sheath was inserted into  the right femoral artery  using standard Seldinger technique and a 3.5 wire.  The short right  Judkins 5-French catheter was then used for directionality to access the  right limb of the aortobifemoral bypass graft using a Wholey wire.  This  was then exchanged for a 23-cm Cordis bright tip 7-French sheath.   A 6-French right-left Judkins diagnostic catheters along with a 6-French  pigtail catheter were used for selective cholangiography and left  ventriculography.  Visipaque dye was used for the entirety of the case.  Retrograde aortic, ventricular, and pullback pressures were recorded.   HEMODYNAMIC RESULTS:  1. Aortic systolic pressure 164, diastolic pressure 65.  2. Left ventricular systolic pressure 167, and diastolic pressure 19.   SELECTIVE CHOLANGIOGRAPHY:  1. Left main normal.  2. LAD; the proximal and mid LAD stents widely patent.  There was 80%      ostial stenosis in the first diagonal branch, which was old.  3.      Left circumflex; stented segment was widely patent with a 40-50%      hypodense lesion just after the stent  before two small      posterolateral branches.  3. Right coronary artery is documented to be occluded in the past, and      was not imaged at this time.  4. Vein graft to the acute marginal and PDA, 99% proximal stenosis,      40% mid, and 56% distal just before the PDA insertion.  5. Left ventriculography; RAO and LAO left ventricular ends were      performed using 20 mL of Visipaque dye at 10 mL per second RAO view      and 25 and 12 in the LAO view.  The overall LVEF was estimated      visually approximately 25% with severe inferobasal, moderate      anteroapical, and severe posterolateral hypokinesia.   IMPRESSION:  Mr. Maxim has widely patent left anterior descending and  circumflex stents.  We will proceed with percutaneous coronary  intervention and stenting of his right coronary artery vein graft.   The patient was already on aspirin and  Plavix.  He received an  additional 300 mg of p.o. Plavix as well as Angiomax bolus with an ACT  of 381.   Using a 7-French JR-4 guide catheter with side holes an 0.014 x 190  [employer] soft wire and 2.25 x 12 apex, predilatation was performed.  Stenting was then performed with a 4.0 x 15 driver up to 16 atmospheres  and post dilatation with a 4.0 x 12 Quantum Maverick at 18 atmospheres  (4.2 mm) resulting reduction of a 99% proximal stenosis to less than 10-  20% residual with excellent flow, no dissection, and no evidence of  distal embolization.  There were no hemodynamic or electrocardiographic  sequelae.  The patient did have sinus arrhythmia during the study.   Mr. Carpenter has widely patent left anterior descending and circumflex  stents, and successful stenting of the sequential vein graft to the  acute marginal and posterior descending artery using a driver bare-metal  stent.  He will be gently hydrated, treated with aspirin and Plavix.  The Angiomax was discontinued and the sheath will be removed in  approximately 2 hours.  The patient left the lab in stable condition.      Nanetta Batty, M.D.  Electronically Signed     JB/MEDQ  D:  10/03/2007  T:  10/04/2007  Job:  [id number]   cc:   Second Floor [facility]  [facility]

## 2010-07-07 NOTE — Discharge Summary (Signed)
NAMEHARLIN, MAZZONI NO.:  [id number]   MEDICAL RECORD NO.:  [id number]          PATIENT TYPE:  INP   LOCATION:  4743                         FACILITY:  [facility]   PHYSICIAN:  Nanetta Batty, M.D.   DATE OF BIRTH:  12/08/1927   DATE OF ADMISSION:  10/02/2007  DATE OF DISCHARGE:  10/05/2007                               DISCHARGE SUMMARY   DISCHARGE DIAGNOSES:  1. Unstable angina, right coronary artery vein graft intervention with      Driver stent this admission.  2. Known coronary disease with bypass grafting in 2004 and 1995 with      multiple subsequent intervention, his last intervention was September 05, 2007, with the circumflex and left anterior descending stent      which was complicated by sudden occlusion of the circumflex stents      requiring re-intervention, September 06, 2007.  3. Left ventricular dysfunction, his ejection fraction was 35-45% in      his last catheterization, his catheterization this admission showed      ejection fraction to be 25%.  4. Treated hypertension.  5. Treated dyslipidemia.  6. History of spinal stenosis.  7. Left brain stroke in 1998 with residual right-sided weakness.  8. Remote history of promyelytic anemia.  9. Hyperkalemia while on ACE inhibitor and Aldactone.   HOSPITAL COURSE:  Dustin [name] is a 77 year old male who was discharged  after an elective intervention.  He had had an abnormal Myoview prior to  proposed back surgery.  Catheterization done showed a circumflex and LAD  disease which were stented.  This was complicated by an SEMI and abrupt  occlusion of the circumflex stent requiring re-intervention on September 06, 2007.  He was discharged home.  He was readmitted on October 02, 2007,  with recurrent chest pain.  He had also had hyperkalemia while on  Aldactone and ACE inhibitor with a potassium of 6.4.  The patient was  admitted through the emergency room, started on IV heparin and nitrates.  Catheterization was  done on October 03, 2007 by Dr. Allyson Sabal.  The  previously placed circumflex and LAD stents were patent.  He has  residual 80% diagonal which was unchanged.  The vein graft to the RCA  had a 99% proximal stenosis which was old.  This was dilated and stented  with a Driver stent.  He tolerated this procedure well.  EF was 25% by  echo.  He was seen in consult by the GI service, Dr. Jeani Hawking.  He  has a long history of gastroesophageal reflux, and Dr. Allyson Sabal was  concerned that there may be a component of GI issues.  Endoscopy was  done prior to discharge on the October 05, 2007.  This was essentially  normal.  He will continue with Prilosec and follow up with Dr. Elnoria Howard as  an outpatient.  We feel he can be discharged, October 05, 2007.   LABORATORY DATA:  White count 5.6, hemoglobin 12.2, hematocrit 36.2, and  platelets 173.  Sodium 141, potassium 4.0, BUN 16, and creatinine  1.0.  CK-MB and troponins were negative.  Chest x-ray shows no acute findings.  EKG with sinus rhythm, PVCs, and left bundle-branch block.   DISPOSITION:  The patient is discharged in stable condition and will  follow up with Dr. Allyson Sabal, he already has an appointment.   DISCHARGE MEDICATIONS:  1. Metformin 1 gram b.i.d. will be resumed on October 07, 2007.  2. Lisinopril 40 mg a day.  3. Plavix 75 mg a day.  4. Zoloft 50 mg a day.  5. HCTZ 25 mg a day.  6. Aspirin 325 mg a day.  7. Amlodipine 10 mg a day.  8. Metoprolol 50 mg twice a day.  9. Crestor 10 mg a day.  10.Prilosec 20 mg a day.  11.Nitroglycerin sublingual p.r.n.   He has been instructed on a low-sodium diet.  He is asked that he go off  Ranexa as he cannot afford it.  I told him this would be okay, and he  can take this up with Dr. Allyson Sabal when he sees him in the office.  We did  stop his Aldactone.  He will need a followup BMP in the office to follow  up his potassium.      Abelino Derrick, P.A.      Nanetta Batty, M.D.  Electronically  Signed    LKK/MEDQ  D:  10/05/2007  T:  10/06/2007  Job:  [id number]   cc:   Nanetta Batty, M.D.

## 2010-07-07 NOTE — Cardiovascular Report (Signed)
NAMETHEODUS, RAN NO.:  [id number]   MEDICAL RECORD NO.:  [id number]          PATIENT TYPE:  INP   LOCATION:  2912                         FACILITY:  [facility]   PHYSICIAN:  Cristy Hilts. Jacinto Halim, MD       DATE OF BIRTH:  12/08/1927   DATE OF PROCEDURE:  09/05/2007  DATE OF DISCHARGE:                            CARDIAC CATHETERIZATION   REFERRING PHYSICIAN:  Antionette Char, MD   PROCEDURE PERFORMED:  1. Left ventriculography.  2. Selective right and left coronary arteriography.  3. Saphenous vein graft angiography.  4. Percutaneous transluminal coronary angioplasty and stenting of the      mid-left anterior descending.  5. Percutaneous transluminal coronary angioplasty and stenting of the      proximal circumflex coronary artery.  6. Right iliac arteriogram.   INDICATIONS:  Mr. Dustin [name] is a 77 year old gentleman with known  coronary disease and has known coronary artery bypass grafting that was  done in 1995, with saphenous graft to obtuse marginal 2 and SVG to RCA.  Catheterization in 2002, had revealed that his SVG to OM was occluded,  however, the SVG to PDA and RV branch was widely patent.   He had a proximal stent placed in 2002, with a 3.0 x 15 mm near NIR  stent and a mid stent placed which is also a near NIR 3.0 stent in March  2004.  He has a mid circumflex coronary artery stent placed 2.25 x 13-mm  Multilink vision in June 2002.  He was having recurrent angina pectoris  and underwent a stress test which had revealed inferior and  inferolateral wall, and apical lateral ischemia.  Given this, he was  brought to the cardiac cath in order to evaluate his coronary anatomy.  He has known aortobifemoral bypass grafting and aortic aneurysm repair  in 1994.  Iliac arteriogram was performed because of difficulty in  access.   ANGIOGRAPHIC DATA:  Right femoral and right iliac arteriogram:  Right  iliac and right femoral arteriogram revealed the significant  tortuosity  at the insertion of the bypass graft to the native iliac artery.  The  aortoiliac graft itself was widely patent.   Left ventriculogram:  Left ventriculography revealed normal LV systolic  function, ejection fraction of 55% with basal inferior akinesis.  No  significant mitral regurgitation.   SVG to RV branch and PDA:  SVG to RV branch and PDA is patent and nice  and smooth, except in the proximal segment has a focal 95% to 99%  stenoses.  There is a mid-to-distal 50% stenosis after the insertion of  the RV branch.   The native RCA:  The native RCA is occluded in the proximal segment.   Left main coronary artery:  Left main coronary artery is large-caliber  vessel.  It is smooth and normal.   LAD:  LAD is large-caliber vessel.  The previously placed stents in the  proximal and mid LAD at patent with mild neointimal hyperplasia.  There  is a small diagonal 1 and a moderate-to-large sized diagonal 2.  After  this  origin of the diagonal II, there was a 90% focal stenosis in the  LAD.  There was a tandem 80% stenosis.  The distal LAD has mild luminal  irregularity.   Circumflex coronary artery:  Circumflex coronary artery in the proximal  segment had a focal 80% stenosis.  The previously placed stent in the  mid segment was widely patent.  The distal branches had mild luminal  irregularity.   INTERVENTION DATA:  Successful PTCA and stenting of the mid-LAD with  implantation of a 3.0 x 24 mm Endeavor stent.  This stent was  postdilated with a 3.25 x 15-mm Voyager balloon at 16 atmospheric  pressure x3.  Overall, the stenosis was reduced from 90% to 0% with TIMI  3 to TIMI 3 flow maintained.   There was a possibility of spasm versus type A dissection of the distal  edge of the LAD stent, however, there was brisk flow noted and after  having performed circumflex coronary angioplasty a recurrent angiography  of the left system had revealed excellent flow without any  contrast  hangup or any evidence of thrombus.   Successful PTCA and stenting of the proximal circumflex coronary artery  overlapping with the previously placed Multilink Vision in the mid-to-  distal segment.  A 2.5 x 14-mm Endeavor stent was deployed at 16  atmospheric pressure.  The stenosis was reduced from 80% to 0% with TIMI  3 flow without any evidence of dissection or thrombus.   RECOMMENDATIONS:  The patient had chest discomfort, but this he stated  was different from his angina.  However, he did have chest pain and  anginal symptoms with every time the balloon inflation occurred in the  LAD and circumflex coronary artery.  The other chest discomfort that  occurred at the end of the procedure.  He states, it was related to his  reflex and after having a sip of water, he said he was completely  asymptomatic.  Given the fact that he has had 2 stents placed in a  fairly complex manner, we felt that the saphenous vein graft can be  tackled at a later time and in an elective fashion.   Also, given the fact that the inferobasal akinesis and prior myocardial  infarction.  He probably has significant scar in the inferior and  inferobasal region.  Hence LAD and circumflex coronary artery  distributions are probably more important for long-term survival and  anginal relief.   A total of 210 mL of contrast was utilized for diagnostic and  interventional procedure.  He will be admitted to the hospital and ruled  out for myocardial infarction and if he remains stable, he will be  discharged home in the morning.  I will set up an outpatient PCI to  saphenous vein graft to the right coronary artery at a later date.   TECHNIQUE OF THE PROCEDURE:  Under usual sterile precautions, using a 5-  French right femoral arterial access, which was obtained by placing a 5-  [place] dilator; right femoral angiography and right iliac angiography  was performed.  Then, using a Glidewire, I was able to  manipulate and  cross away from the true lumen of the iliac artery into the aortoiliac  bypass graft on the right and after having done this wire, I placed a 5-  [place] 24-cm sheath.  Preformed catheters were utilized to perform  coronary arteriography.  There was no complications or no problems  occurred passing the catheters in and out  through the 5-French long  sheath.  The JR4, 5-French catheter was also utilized to perform SVG to  RCA angiography.  The catheter was then pulled out of body and pigtail  catheter was utilized to perform left ventriculography in the RAO  projection.  The catheter exchanges occurred over a J-wire.   Technique of intervention.  The 5-French sheath was exchanged to a 45 cm  long 7-French sheath.  Using a XB 3.5 guide, left main coronary artery  was selectively engaged and angiography was performed.  Using Angiomax  anticoagulation and using ATW guidewire, the lesion length was carefully  measured and the mid-LAD lesion was predilated with a 2.5 x 15-mm  sprinter and eventually stented with a 3.0 x 24 mm Endeavor stent and  postdilated with a 3.25 of 15-mm Voyager balloon.  The wires were  withdrawn from angiography.  The attention directed towards circumflex  coronary artery.  The circumflex coronary artery was wired with the same  ATW guidewire and a 2.5 x 15-mm sprinter balloon was utilized for  balloon angioplasty followed by stent implantation with a 2.5 x 14-mm  endeavor at 16 atmospheric pressure.  The stent micro intracoronary  nitroglycerine was also admits to  angiography was repeated.  The catheters were disengaged and pulled out  of the body after obtaining adequate views.  The long sheath was then  exchanged to a short 7-French sheath.  The patient of the procedure.  No  other immediate complications were noted.      Cristy Hilts. Jacinto Halim, MD  Electronically Signed     JRG/MEDQ  D:  09/05/2007  T:  09/05/2007  Job:  [id number]   cc:   Antionette Char, MD  Danae Orleans Venetia Maxon, M.D.

## 2010-07-07 NOTE — Cardiovascular Report (Signed)
NAMEJAYVON, [name]:  [id number]   MEDICAL RECORD NO.:  [id number]          PATIENT TYPE:  INP   LOCATION:  2912                         FACILITY:  [facility]   PHYSICIAN:  Nanetta Batty, M.D.   DATE OF BIRTH:  12/08/1927   DATE OF PROCEDURE:  09/05/2007  DATE OF DISCHARGE:                            CARDIAC CATHETERIZATION   Dustin [name] is a 77 year old gentleman with history of CAD status post  bypass grafting in 1995.  He complains of exertional class II to III  angina.  He has had stenting of his LAD and circumflex in the past.  His  other problems include a history of abdominal aortic aneurysm status  post aortobifemoral bypass grafting in 1994, hypertension,  hyperlipidemia, diabetes, and spinal stenosis followed by Dr. Venetia Maxon.  He  was cathed yesterday by Dr. Jacinto Halim revealing high-grade disease in the  mid LAD and the proximal in the mid circumflex.  He had stenting of both  vessels by Dr. Jacinto Halim successfully.  Also had documentation of a high-  grade ostial vein graft stenosis to the right coronary artery.  Access  was problematic because of the aortobifemoral bypass graft insertion.  The patient had chest pain over the evening and had new anterolateral T-  wave inversion with positive enzymes.  He was brought back to the lab  after being evaluated by Dr. Ritta Slot for repeat diagnostic  coronary arteriography to define his anatomy.   PROCEDURE:  The patient was brought to the second floor of the [facility]iac Cath Lab in postabsorptive state.  He was premedicated with  p.o. Valium.  His right groin was prepped and shaved in usual sterile  fashion.  One percent Xylocaine was used for local anesthesia.  A short  6-French sheath was inserted into the right femoral artery using  standard Seldinger technique.  A short 5-French right Judkins diagnostic  catheter along with a Glidewire was used with difficulty to access the  aortobifemoral bypass graft.   After accessing the central circulation,  the short sheath was exchanged over the glide wire for 23 mm 6-French  sheath.  A 6-French left Judkins diagnostic catheter as well as a 6-  French pigtail catheter were used for selective cholangiography, and  left ventriculography in the RAO, and LAO view.  Visipaque dye was used  for the entirety of the case.  Retrograde aortic, left ventricular  pullback pressures were recorded.   HEMODYNAMICS:  1. Aortic systolic pressure 111, diastolic pressure 50.  2. Left ventricular systolic pressure 110, and diastolic pressure 21.   SELECTIVE CORONARY ANGIOGRAPHY:  1. Left main normal.  2. LAD; the proximal and mid LAD stents were widely patent.  There was      a 40% stenosis in the mid LAD after the stented segment.  The      diagonal branch had a 95% ostial stenosis which may be related to      plaque shift as a result of the previous procedure.  3. Left circumflex; occluded proximally.  4. Right coronary artery; occluded proximally.  5. Vein  graft to the right coronary; 95% aorto-ostial stenosis which      had been documented yesterday with plans to stage intervention in a      week.  6. Left ventriculography; RAO and LAO left ventriculogram were      performed using 25 mL of Visipaque dye at 12 mL per second in the      each view.  There was moderate inferobasal hypokinesis in the RAO      view which was old; however in the LAO view, the lateral wall was      severely hypokinetic to akinetic, possibly related to the mid      circumflex occlusion.   IMPRESSION:  Acute stent thrombosis of the circumflex.  This was  clinically assessed by Dr. Lynnea Ferrier correctly with EKG changes, symptoms  of chest pain and positive enzymes.  We will proceed with attempted PCI  and stenting for acute stent thrombosis.   DESCRIPTION OF PROCEDURE:  Using a 6-French XB 35 guide catheter along  with an OM4 190 PT Graphix wire and 2-5, 15 Sprinter Legend balloon,  PCI  was performed.  The wire past the lesion with some difficulty.  Ultimately, was able to traverse the distal circumflex.  The entire  segment was dilated with a 2-5 Sprinter.  This revealed clot in the  proximal stent with an outflow lesion.  The distal circumflex beyond the  previously stented segment was then stented with a 225, 16 Taxus Adam  drug-eluting stent up to 2-5 mm.  Following this, the entire previously  stented segment was then postdilated with a 275, 15 balloon up to 14-16  atmospheres.  There remained a intraluminal filling defect proximally  which I thought was thrombus.  I attempted to aspiration thrombectomized  this; however, the Fetch thrombectomy catheter could not traverse the  proximal segment probably because of mechanical issues with regard to  the stent.  Therefore, I re-stented that area with a 3-0, 15 endeavor  stent up to 16 atmospheres (3.25 mm) resulting in reduction of that  little defect to 0% residual with TIMI III flow.  The patient tolerated  the procedure well.  There were no hemodynamic or electrocardiographic  sequelae.   IMPRESSION:  Successful PCI and stenting for acute stent thrombosis with  TIMI III flow and lateral wall motion abnormality.  The patient received  300 mg of additional Plavix, Angiomax bolus with an ACT of 348 and  because of the thrombus, he received provisional adjunctive Integrilin  single bolus infusion.  The guidewire and catheter were removed.  The  sheath was subsequently in place.  The Angiomax was turned off at the  end of the case with plans to pull the sheath in 2 hours.  Integrilin to  be continued for 18 hours and his enzymes were cycled.   IMPRESSION:  Successful PCI and stenting of the circumflex for acute  stent thrombosis.  The patient does have residual ostial diagonal branch  disease and high-grade aorto-ostial RCA vein graft disease which will  need to be addressed at a future date.  He left the lab in  stable  condition.      Nanetta Batty, M.D.  Electronically Signed     JB/MEDQ  D:  09/06/2007  T:  09/07/2007  Job:  [id number]   cc:   Second Floor [facility]  [facility]  Floris R. Jacinto Halim, MD  Antionette Char, MD

## 2010-07-07 NOTE — H&P (Signed)
NAMEVIDAL, LAMPKINS NO.:  [id number]   MEDICAL RECORD NO.:  [id number]          PATIENT TYPE:  INP   LOCATION:                               FACILITY:  [facility]   PHYSICIAN:  Nanetta Batty, M.D.   DATE OF BIRTH:  1926/06/21   DATE OF ADMISSION:  03/18/2008  DATE OF DISCHARGE:                              HISTORY & PHYSICAL   CHIEF COMPLAINT:  Shortness of breath and cough.   HISTORY OF PRESENT ILLNESS:  The patient is a 77 year old male who is  followed by Dr. Allyson Sabal and Dr. Elesa Massed at the [facility] in [place].  He has multiple  medical problems.  He had bypass surgery in 1995.  He has had multiple  interventions since.  In July he had an LAD and circumflex stent on September 05, 2007 and then had abrupt occlusion of the circumflex site September 06, 2007, requiring an angioplasty.  He was admitted in January of this year  with community-acquired pneumonia.  During his hospitalization he was  seen by the pulmonary service who felt he also had mixed heart failure  involved.  His BNP was elevated during that hospitalization.  He was  diuresed and actually we cathed him on March 06, 2008.  Catheterization revealed a total occlusion of his old diseased vein  graft to the RCA and he did have left-to-right collaterals.  The LAD and  circumflex stents were patent.  He has a 99% proximal small diagonal.  Plan was for medical therapy as noted.  His EF was 45%.  The patient was  discharged around March 08, 2008.  The patient said initially after  discharge he did well but has developed a cough.  It is nonproductive  but sometimes it gets stuck in the back of his throat.  He does have  some dyspnea on exertion.  He is seen now in the office for further  evaluation.   PAST MEDICAL HISTORY:  Is remarkable for:  1. Type 2 non-insulin diabetes.  2. He has a history of leukemia and chronic anemia and is followed at      [facility].  He was treated with chemotherapy back in  the late 90s and this has been fairly stable.  3. He had a left brain CVA in 1998 without residual.  4. He has had a history of Parkinson's that has been controlled.  5. He has spinal stenosis and sees Dr. Venetia Maxon.  6. He has vascular disease and has had aortobifemoral bypass grafting      in 1994.  7. He had a past history of kidney stones.  8. He had diverticulitis but no GI bleeding.   CURRENT MEDICATIONS:  1. Aspirin 325 mg daily.  2. Plavix 75 mg daily.  3. Imdur 15 mg daily.  4. Spironolactone 25 mg daily.  5. Prilosec 40 mg daily.  6. Zoloft 50 mg daily.  7. Metoprolol 25 mg b.i.d.  8. Lisinopril 10 mg daily.  9. Pravastatin 40 mg daily.  10.Metformin 1 gram b.i.d.  11.Amlodipine 10 mg daily.  12.Mucinex every  4 hours p.r.n.  13.Claritin 10 mg daily.  14.Folic acid 1 mg daily.   HE IS INTOLERANT TO CODEINE.   SOCIAL HISTORY:  He quit smoking 30 years ago.  When he did smoke, he  smoked about  three or four cigarettes a day.  He worked at [employer].  He is not a drinker.  He is retired, has five children  and grandchildren.   FAMILY HISTORY:  Is unremarkable for early coronary disease.   REVIEW OF SYSTEMS:  The patient recently saw his oncologist at [facility].  He has a followup appointment in February.  Review of systems  otherwise unremarkable except for noted above.  He denies any melena or  GI bleeding.   PHYSICAL EXAM:  Blood pressure 100/54, pulse 126, respirations 12.  GENERAL:  He is a well-developed, well-nourished male in no acute  distress.  HEENT: Normocephalic, atraumatic.  Extraocular movements are intact.  Sclerae is nonicteric.  NECK:  Without JVD or bruits.  CHEST:  Reveals few rhonchi at the right base.  I could not appreciate  any significant expiratory wheezing or rales.  CARDIAC:  Exam reveals irregularly irregular rhythm without obvious  murmur, rub or gallop.  ABDOMEN:  Nontender.  No breast thyromegaly.  No bruits.   EXTREMITIES:  Reveal no edema.  NEURO:  Exam is grossly intact.  He is awake, alert and oriented,  cooperative.  Moves all extremities without obvious deficit.  SKIN:  Is warm and dry.   His EKG shows atrial fibrillation with interventricular conduction delay  and poor anterior R-wave progression.  Previous EKGs show a long history  of sinus bradycardia with PAC's.   IMPRESSION:  1. Cough and shortness of breath.  Rule out congestive failure.  2. ACE inhibitor therapy.  The patient has been on ACE inhibitor for      some time and this has not caused him a problem the past.  3. Recent community-acquired pneumonia January 2010.  4. Coronary disease with coronary artery bypass grafting in 1995,      multiple subsequent percutaneous coronary interventions.  His last      catheterization March 06, 2008 showed an occluded vein graft to      the RCA, patent LAD and circumflex stents and a 99% diagonal, plan      is for medical therapy.  5. Ejection fraction of 45% at recent catheterization.  6. Type 2 non-insulin-dependent diabetes.  7. Chronic anemia with a history of leukemia followed at [facility].  8. Treated dyslipidemia.  9. Prior left brain cerebrovascular accident in 1998.  10.History of Parkinson's.  11.Spinal stenosis followed by Dr. Venetia Maxon.  12.Vascular disease with prior aortic bifemoral bypass grafting in      1994.   PLAN:  The patient is seen by Dr. Garen Lah and  myself today in the  office.  We are going to admit Mr. Salazar to the hospital for further  treatment.  We will check a b-type natriuretic peptide and start him on  some intravenous diuretics.  We will also take him off his ACE inhibitor  and put him on an angiotensin II receptor blocker because of  his cough.  Dr. Garen Lah would like to start him on amiodarone.  We will  change his Norvasc to Diltiazem.  If his cough does not resolve or there  is no evidence of heart failure by laboratories or  x-ray then we may  need  to get pulmonary involved again.      Abelino Derrick, P.A.      Nanetta Batty, M.D.  Electronically Signed    LKK/MEDQ  D:  03/18/2008  T:  03/18/2008  Job:  [id number]

## 2010-07-07 NOTE — H&P (Signed)
NAMEPEYSON, POSTEMA NO.:  [id number]   MEDICAL RECORD NO.:  [id number]          PATIENT TYPE:  INP   LOCATION:  1831                         FACILITY:  [facility]   PHYSICIAN:  Nanetta Batty, M.D.   DATE OF BIRTH:  12/08/1927   DATE OF ADMISSION:  10/02/2007  DATE OF DISCHARGE:                              HISTORY & PHYSICAL   CHIEF COMPLAINT:  Chest pain.   HISTORY OF PRESENT ILLNESS:  Mr. Dustin [name] is a 77 year old male followed by  Dr. Allyson Sabal, previously followed by Dr. [name], and also seen by Dr. Caprice Kluver in the past.  He has a history of coronary disease.  He had  bypass surgery x4 in 1995.  He was considering back surgery.  He  underwent Myoview study August 14, 2007 ordered by Dr. [name].  This  showed a defect at the basilar inferior septal mid inferior and apical  regions.  It was felt that this was a high risk study.  He was seen in  consult by Dr. Allyson Sabal in the office August 21, 2007.  He admitted to Dr.  Allyson Sabal that he had been having chest pain for several months and taking  nitroglycerin.  He was admitted for diagnostic catheterization which was  done in September 05, 2007 by Dr. Jacinto Halim.  This revealed a 99% SVG to the RCA,  there was a previously placed Multilink circumflex stent from June of  2002, previously placed left main stent from April 2002, and a  previously placed proximal LAD stent from March 2004.  There were new  lesions in the mid LAD of 90% and mid circumflex of 80%.  EF was 55%.  The patient underwent Endeavor stenting to the circumflex and LAD.  Post  procedure, he had a bump in his enzymes and recurrent chest pain that  was worrisome for unstable angina.  His EKG had ischemic EKG changes the  next day and he was taken back to the cath lab by Dr. Allyson Sabal.  The  patient had had a subsequent abrupt occlusion of the circumflex, which  was intervened on.  The previously placed LAD stent was patent.  The  plan was to intervene on the RCA a later  date.  His EF was 35-45% on the  15th, which was the day of his second catheterization.  The patient was  ambulated and discharged, and  presents now with recurrent chest pain.  He says he had a bad episode last night and had take nitroglycerin.  He also has multiple other vague somatic complaints including leg  weakness, blurred vision and mild nausea.  He is now seen in the  emergency room for further evaluation.  Currently he has slight chest  pain.   PAST MEDICAL HISTORY:  Remarkable for previous aortobifemoral grafting  in 1994.  He has had a left brain stroke in 1998.  He has spinal  stenosis and has been followed by Dr. Venetia Maxon.  He has a history of non-  insulin-dependent diabetes.  He has treated hypertension and  dyslipidemia.  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   CURRENT MEDICATIONS:  1. Aspirin 325 mg a day.  2. Plavix 75 mg a day.  3. Crestor 10 mg a day.  4. Ranexa 500 mg twice a day.  5. Folic acid 1 mg a day.  6. Hydrochlorothiazide 25 mg a day.  7. Metformin 1 gram twice a day.  8. Prilosec 20 mg a day.  9. Zoloft 50 mg a day.  10.Aldactone 25 mg a day.  11.Zestril 40 mg a day.  12.Metoprolol 12.5 mg twice a day.  13.Vitamin E.  14.Nitroglycerin sublingual p.r.n.   He is allergic or intolerant to CODEINE and NONSTEROIDAL ANTI-  INFLAMMATORIES.   SOCIAL HISTORY:  He is married.  He is a nonsmoker.  He is retired.  [facility] but has been limited by his leg weakness.   FAMILY HISTORY:  Unremarkable.   REVIEW OF SYSTEMS:  Essentially unremarkable except for the above, he  had some vague blurred vision and leg weakness.  He says this comes on  in waves.  The patient's potassium this past Friday was elevated at  6.5 and his Aldactone and lisinopril were held.  The patient attributes  his symptoms to his high potassium.    PHYSICAL EXAM:  VITAL SIGNS:  The blood pressure 146/58, pulse 55,  temperature 97.9.  GENERAL:  He is a well-developed, well-nourished male in no acute  distress.  HEENT:  Normocephalic, atraumatic.  Extraocular movements intact.  He  wears glasses.  NECK:  Without JVD or bruits.  Thyroid is not enlarged.  CHEST:  Clear to auscultation and percussion.  He has a few crackles  over the left base.  CARDIAC:  Reveals regular rate and rhythm without murmur, rub, or  gallop.  ABDOMEN:  Nontender.  No bruits.  He has a midline surgical scar.  EXTREMITIES:  Reveal diminished distal pulses bilaterally.  NEURO:  Grossly intact, he does have right-sided weakness.  He uses a  cane.  He is awake, alert and oriented, cooperative.  SKIN:  Warm and dry.   EKG today shows sinus rhythm, left axis deviation, left bundle branch  block.   IMPRESSION:  1. Unstable angina.  2. Known coronary disease with coronary artery bypass grafting x4 in      1995, multiple subsequent interventions.  His last intervention was      September 05, 2007 with a circumflex and LAD stent which was complicated      by sudden occlusion of the circumflex stents requiring read      intervention on September 06, 2007.  3. Residual 95% vein graft to the right coronary artery.  4. Left ventricular dysfunction with an EF of 35-40% at his last      catheterization.  5. Peripheral vascular disease with aortic aneurysm repair grafting in      1994.  6. Treated hypertension.  7. Treated dyslipidemia.  8. History of spinal stenosis.  9. Left brain cerebrovascular accident in 1998 with residual right-      sided weakness.  10.Remote promyelitic anemia.  11.Recent hyperkalemia, probably secondary to Aldactone and ACE      inhibitor, repeat potassium is pending.   DISPOSITION:  The patient is to be put on heparin and nitro, and  admitted.  He will be seen by Dr. Allyson Sabal.  It may be that we need to  proceed with intervention to the vein graft  to the RCA sooner than  planned.      Abelino Derrick, P.A.      Nanetta Batty, M.D.  Electronically Signed    LKK/MEDQ  D:  10/02/2007  T:  10/02/2007  Job:  [id number]

## 2010-07-07 NOTE — Cardiovascular Report (Signed)
NAMEKAMIR, SELOVER NO.:  [id number]   MEDICAL RECORD NO.:  [id number]          PATIENT TYPE:  INP   LOCATION:  6522                         FACILITY:  [facility]   PHYSICIAN:  Nanetta Batty, M.D.   DATE OF BIRTH:  12/08/1927   DATE OF PROCEDURE:  DATE OF DISCHARGE:                            CARDIAC CATHETERIZATION   Dustin [name] is a 77 year old gentleman with history of CAD, status post  bypass grafting in 1995 and 2004.  I stented his circumflex for acute  stent thrombosis in July of last year and then staged the origin of his  PDA vein graft.  His other problems include diabetes, hypertension, and  dyslipidemia.  He has a history of ischemic cardiomyopathy.  He was  admitted on February 27, 2008, with bronchitis and pneumonia for which he  has been treated with pulmonary toilet and antibiotics.  He did have  positive enzymes and presents here for diagnostic coronary arteriography  to define his anatomy prior to discharge.   DESCRIPTION OF PROCEDURE:  The patient was brought to the second floor  [facility] in the postabsorptive state.  He was  medicated at the end of the case with 50 mg of fentanyl.  His right  groin was prepped and shaved in usual sterile fashion.  Xylocaine 1% was  used for local anesthesia.  A 6-French 23-cm long sheath was inserted  into the right femoral artery.  An 0.035 wire was then guided up the  right limb of the aortobifemoral bypass graft under direct angiographic  and fluoroscopic control.  A 6-French right and left Judkins diagnostic  catheter as well as 6-French pigtail catheter were used for selective  coronary angiography, selective vein graft angiography, and left  ventriculography respectively.  Visipaque dye was used for the entirety  of the case.  Retrograde aortic, left ventricular, and pullback  pressures were recorded.   HEMODYNAMICS:  1. Aortic systolic pressure 140, diastolic pressure 57.  2. Left  ventricular systolic pressure 132, end-diastolic pressure 19.   SELECTIVE CORONARY ANGIOGRAPHY:  1. Left main normal.  2. LAD; the proximal mid LAD stents widely patent.  There was a      moderate size proximally bifurcating diagonal branch, which had 99%      ostial stenosis unchanged from prior cath.  3. Circumflex; the mid AV groove circumflex stents were widely patent.  4. Right coronary; occluded at its origin with left-to-right filling      of the septal perforating collaterals.  5. Vein graft to the PDA and PLA occluded at the origin of the      previously stented site.  6. Left ventriculography; RAO left ventriculogram was performed using      25 mL of Visipaque dye at 12 mL per second.  The overall LVEF was      estimated at approximately 45% with moderate inferobasal      hypokinesia.   IMPRESSION:  Dustin [name] has occluded the saphenous vein graft to his  distal right coronary artery.  He does have left-to-right collaterals  and ejection fraction  of 45%.  His left coronary system stents are  widely patent.  Continued medical therapy will be recommended.   Sheath was removed, and pressure was held in the groin to achieve  hemostasis.  The patient left the lab in stable condition.  He will be  gently hydrated.  His renal function will be assessed in the morning.  Most likely, he will be discharged home after that.      Nanetta Batty, M.D.  Electronically Signed     JB/MEDQ  D:  03/06/2008  T:  03/06/2008  Job:  [id number]   cc:   [facility]  [facility]

## 2010-07-07 NOTE — H&P (Signed)
NAMEBILLYJOE, GO NO.:  [id number]   MEDICAL RECORD NO.:  [id number]          PATIENT TYPE:  INP   LOCATION:  1830                         FACILITY:  [facility]   PHYSICIAN:  Ladell Pier, M.D.   DATE OF BIRTH:  12/08/1927   DATE OF ADMISSION:  02/27/2008  DATE OF DISCHARGE:                              HISTORY & PHYSICAL   CHIEF COMPLAINT:  Shortness of breath.   HISTORY OF PRESENT ILLNESS:  The patient is a 77 year old white male  with past medical history significant for multiple medical problems,  including CHF, heart disease, diabetes, hypertension, dyslipidemia.  The  patient stated that for the past day he has not been feeling well.  He  has been coughing up mucus that is tan-brown.  He really has been short  of breath with nausea and vomiting but no diarrhea.  He has been having  chest pain with coughing.  He took his temperature at home.  Yesterday  it was 102.  He went to see Dr. Chilton Si this morning.  He was told he had  asthmatic bronchitis, given some antibiotic, and sent home.  He was also  given some Tessalon Perles.  He got back home and was just not feeling  any better, so his wife took him to the emergency room.   PAST MEDICAL HISTORY:  1. Significant for CHF with LV EF 45% in July 2009.  2. Coronary artery disease, status post CABG and stent followed by Dr.      Allyson Sabal.  3. History of aorto stem parallelograft in 1994.  4. History of left brain stroke in 1998.  5. Spinal stenosis followed by Dr. Venetia Maxon.  6. Type 2 diabetes.  7. Hypertension.  8. Dyslipidemia.  9. History of leukemia in remission that was followed by [facility].  10.Post herpetic neuralgia.  11.Parkinson's disease.   FAMILY HISTORY:  Noncontributory.   SOCIAL HISTORY:  The patient is married.  He does not smoke.  He has a  remote history of smoking.  He is retired.  He has five children.  He  drinks socially.   MEDICATIONS:  1. Metformin 1000 mg twice daily.  2.  Prilosec 30 mg daily.  3. Plavix 75 mg daily.  4. Pravastatin 40 mg at bedtime.  5. Zoloft 100 mg half daily.  6. HCTZ 25 mg daily.  7. Aspirin 325 mg daily.  8. Norvasc 10 mg daily.  9. Spironolactone 25 mg daily.  10.Lisinopril 40 mg daily.  11.Metoprolol 50 mg half twice daily.  12.Gabapentin 300 mg two twice daily.  13.Lasix 20 mg as needed.  14.Folic acid 1 mg daily.  15.Vitamin E daily.  16.Nitroglycerin as needed.   ALLERGIES:  1. CODEINE.  2. NSAIDS.   REVIEW OF SYSTEMS:  Negative other than as stated in the HPI.   PHYSICAL EXAMINATION:  VITAL SIGNS:  Temperature 101.5, blood pressure  110/69, pulse 83, respirations 18, pulse ox 94% on room air.  GENERAL:  The patient is sitting up on stretcher, does not seem to be in  any acute distress.  HEENT:  Head is normocephalic, atraumatic.  Pupils reactive to light but  without erythema.  CARDIOVASCULAR:  Regular rate and rhythm.  LUNGS:  Decreased breath sounds at the bases.  ABDOMEN:  Soft, nontender.  Positive bowel sounds.  EXTREMITIES:  Without edema.   LABORATORY DATA:  BNP 1063.  Troponin 0.06, CK 172, MB 2.5.  Sodium 140,  potassium 4.4, chloride 108, CO2 20, blood glucose 122, BUN 21,  creatinine 0.89.  Lactic acid level 2.  WBC 6.1, hemoglobin 11.4, MCV  85.4, platelet 167.   Chest x-ray showed right lower lobe pneumonia.   ASSESSMENT/PLAN:  1. Pneumonia.  2. Congestive heart failure.  3. Coronary artery disease.  4. Diabetes.  5. Hypertension.  6. Dyslipidemia.  7. Post herpetic neuralgia.  8. Elevated BNP.  9. Pleuritic chest pain.  10.Anemia.   Will admit the patient to the hospital.  Time spent with the patient and  note about one hour.   PLAN:  Admit the patient to the hospital.  Start him on IV Rocephin plus  azithromycin.  Will hold IV fluids for now with elevated BNP.  Will give  p.r.n. Lasix.  Cycle cardiac markers.  Blood cultures x2.  Start him  also on Tamiflu and droplet  precaution.   His chest pain with cough and  is more pleuritic and secondary mostly to the pneumonia but will cycle  cardiac enzymes and get an EKG.      Ladell Pier, M.D.  Electronically Signed     NJ/MEDQ  D:  02/27/2008  T:  02/28/2008  Job:  [id number]   cc:   Dr. Chilton Si

## 2010-07-07 NOTE — Discharge Summary (Signed)
[name], [name]:  [id number]   MEDICAL RECORD NO.:  [id number]          PATIENT TYPE:  INP   LOCATION:  6522                         FACILITY:  [facility]   PHYSICIAN:  Dustin [name], M.D.    DATE OF BIRTH:  12/08/1927   DATE OF ADMISSION:  02/27/2008  DATE OF DISCHARGE:  03/08/2008                               DISCHARGE SUMMARY   ADDENDUM   Please see the previous discharge summary dictated by Dr. Isidor [name].  This is an addendum.   CONSULTATIONS:  Dustin Batty, MD and colleagues.   DISCHARGE DIAGNOSES/HOSPITAL COURSE:  1. NON-ST ELEVATION MYOCARDIAL INFARCTION, SYSTOLIC CONGESTIVE HEART      FAILURE WITH AN EJECTION FRACTION OF 45%, HYPERTENSION, AND      CORONARY ARTERY DISEASE:  The patient underwent further evaluation      with a cardiac catheterization performed by Dr. Nanetta [name] on      March 06, 2008.  Please see his dictated report for details.  In      essence, the cardiac catheterization revealed that the patient had      an occluded saphenous vein graft to his distal right coronary      artery; however, he did have evidence of left-to-right collaterals.      His left coronary system stents were widely patent.  His ejection      fraction was estimated to be 45%.  Given these findings, Dr. Allyson [name]      recommended ongoing medical therapy.  During the course of the      hospitalization, a number of his medications were either      discontinued, modified, or continued as is.  The complete list of      discharge medications is listed below.  The patient is currently in      no acute distress.  He has had no chest pain for the past several      days.  His BNP on March 07, 2008 was slightly elevated at 555.      His CK was 49 and his troponin I was 0.08 on March 05, 2008.  2. PNEUMONIA:  The patient is currently afebrile and his white blood      cell count is within normal limits.  He is currently asymptomatic      with regards to  pulmonary symptoms.  He will complete therapy with      Avelox and prednisone in 3 days upon discharge.  3. TYPE 2 DIABETES MELLITUS:  The patient's capillary blood glucose      has been very well controlled on sliding scale NovoLog and Lantus.      The patient is chronically treated with metformin 1000 mg b.i.d.      His hemoglobin A1c was elevated at 7.9.  The patient does not yet      need insulin therapy for home; however, glipizide has been added at      2.5 mg half a tablet daily.  4. ANEMIA, PROBABLY OF CHRONIC DISEASE:  The patient stated that he      underwent  evaluation with a colonoscopy approximately a year or two      ago at the [facility].  According to his history, there were no      serious abnormalities.  The patient also has a history of leukemia      and has apparently been in remission for approximately 10 years.      His oncologist at [facility] recommended that he take an iron      supplement daily.  The patient does not take the iron supplement on      a regular basis.  However, he does take a multivitamin with iron      more consistently.  His hemoglobin has generally ranged from 10.0-      10.5.   DISCHARGE MEDICATIONS:  1. Avelox 400 mg daily for 3 more days.  2. Prednisone taper take as directed for 3 more days.  3. Glipizide 2.5 mg half a tablet daily.  4. Restart metformin at 1000 mg b.i.d. on Saturday March 09, 2008.  5. Decrease lisinopril to 10 mg daily.  6. Isosorbide mononitrate 15 mg daily.  7. Protonix 40 mg daily.  8. Plavix 75 mg daily.  9. Pravastatin 40 mg q.h.s.  10.Zoloft 100 mg half a tablet daily.  11.Enteric-coated aspirin 325 mg daily.  12.Spironolactone 25 mg daily.  13.Metoprolol 50 mg half a tablet b.i.d.  14.Gabapentin 300 mg b.i.d.  15.Folic acid 1 mg daily.  16.Vitamin E 400 units daily.  17.Nitroglycerin 0.4 mg sublingual as needed for chest pain.   DISCHARGE DISPOSITION:  The patient is being discharged to home in   improved and stable condition today on March 09, 2007.  He was advised  to follow up with his primary care physician, Dr. Chilton [name], or his  physician at the [facility] in 1-2 weeks.  He has an appointment to  follow up with his cardiologist, Dr. Nanetta [name], on March 18, 2008.      Dustin [name], M.D.  Electronically Signed     DF/MEDQ  D:  03/08/2008  T:  03/08/2008  Job:  [id number]   cc:   Dustin [name], M.D.  Dustin [name], M.D.

## 2010-07-10 NOTE — H&P (Signed)
[place]. [facility]  Patient:    Dustin [name], Dustin [name] Visit Number: [id number] MRN: [id number]          Service Type: MED Location: [phone] Attending Physician:  Kimber Relic Dictated by:   Lenon Curt. Cassell Clement, M.D. Admit Date:  05/02/2001                           History and Physical  CHIEF COMPLAINT:  Chest discomfort x2 days.  HISTORY OF PRESENT ILLNESS:  A 77 year old white male patient followed in primary care by Dr. Murray Hodgkins at [facility] was admitted through that office May 02, 2001 after presenting with complaints of combined problems of chest discomfort for the last 2-3 days which were building in intensity last night and this morning.  He also was experiencing shortness of breath and cough.  He complained of lower abdominal discomfort of a colicky nature as well as abdominal bloating.  The patient stated the pain in the chest intensified with walking, it hurts to take a deep breath, and he showed evidence of splinting respirations when asked to do this.  He was admitted to the hospital for further evaluation of his chest discomfort and lower abdominal discomfort.  The patient ultimately spiked a temperature of 101 degrees the afternoon of the day of admission; however, he had been afebrile up to that point.  He was not producing any foul sputum and denied hemoptysis. There was no blood in the gut, diarrhea, or history of recent constipation.  PAST MEDICAL HISTORY:  CVA in 1998 with right-sided weakness.  Myocardial infarction in 1993.  Coronary artery bypass graft x4 in 1995.  Abdominal aortic aneurysm in 1994.  Promyelocytic leukemia diagnosed in 1997 and treated through [facility]; has been in remission since 1999.  Has known congestive heart failure, hypertension, type 2 diabetes under good control.  Other problems included degenerative joint disease, a history of depression, diverticulitis in the past,  hypercholesterolemia, a Parkinson-like tremor.  ALLERGIES:  CODEINE, NONSTEROIDAL ANTI-INFLAMMATORY DRUGS.  MEDICATIONS: 1. Prilosec 20 mg daily. 2. Zoloft 100 mg daily. 3. Zestril 40 mg daily. 4. Lasix 20 mg daily. 5. Lipitor 10 mg daily. 6. Aspirin 325 mg daily. 7. Metformin 500 mg b.i.d. 8. Norvasc 5 mg q.d. 9. Tylenol as needed.  FAMILY HISTORY:  Unremarkable and not pertinent to present admission.  SOCIAL HISTORY:  Married, currently lives with wife.  No alcohol or drug abuse history.  No tobacco intake.  Is retired, has been active, plays golf.  Has five children.  REVIEW OF SYSTEMS:  GENERAL:  There have been some chills but no fever until the afternoon of the day of admission.  Has not experienced any weight loss. HEENT:  Pupils equal, round, and reactive to light and accommodation. Extraocular movements full.  Sclerae white, conjunctivae clear.  TMs and EACs normal.  Pinnae normal.  Hearing has some mild diminishment.  There is a mild dysphonia and stutter as he speaks.  CARDIOPULMONARY:  Chest pain as noted above.  No prior history of significant respiratory problems.  Positive for complaint of shortness of breath and dyspnea with minimal exertion as well as at rest now.  GASTROINTESTINAL:  No prior history of ulcer disease.  See above for prior history of diverticulitis.  There is some constipation chronically but this has not been bad recently.  He has had nausea today but no vomiting. Last bowel movement was the day  prior to admission and did not contain any blood.  GENITOURINARY:  No complaints.  MUSCULOSKELETAL:  Generalized weakness.  Is finding it difficult to get up and walk around stably. NEUROLOGIC:  Unsteady balance.  Otherwise, unremarkable.  PHYSICAL EXAMINATION:  VITAL SIGNS:  Blood pressure 132/80, pulse 67, respirations 22 without evidence of being significantly labored.  The patient felt warm to the touch. Temperature ultimately returned 101  degrees.  GENERAL:  He is an ill-appearing elderly gentleman who is in obvious pain, both in the chest and lower abdomen and appears acutely ill.  HEENT:  Sclerae white, conjunctivae clear.  Pupils equal, round, and reactive to light and accommodation.  Extraocular movements full.  Fundi unremarkable. TMs and EACs normal.  Hearing mildly diminished.  There is a mild dysphonia which has been present for quite some time.  NECK:  Supple.  No thyromegaly, nodule, mass, or bruit.  CHEST:  Bibasilar rales, more intense in the right lower quadrant posterolaterally.  There was splinting of respirations with deep inspiration.  CARDIAC:  Heart sounds are normal with regular rhythm and no gallop, murmur, click, or rub.  PMI is in left fifth intercostal space at midclavicular line.  ABDOMEN:  Distended but soft.  No organomegaly, mass, or bruit.  Active bowel sounds are present.  He is uncomfortable with palpation of the lower abdomen.  EXTREMITIES:  No evidence of clubbing, cyanosis, or edema.  Moves all four well.  NEUROLOGIC:  Cranial nerves intact.  He has an obvious tremor which may be parkinsonian but appears more like benign essential tremor at the present time.  He has an unsteady gait.  He is oriented x3.  There is no cerebellar dysfunction noted.  CIRCULATION:  Pulses +3 in dorsalis pedis and posterior tibials.  IMPRESSION: 1. Chest pain of uncertain origin. 2. Abdominal pain; prior history of diverticulitis.  PLAN:  The patient is to be admitted to a telemetry bed.  Due to prior cardiac history, we have ordered several labs for routine screening purposes as well as urine cultures and blood cultures.  Chest x-ray is to be obtained.  We have also requested ultrasound of gallbladder, liver, and kidneys.  IV fluids have been ordered as well as antibiotics which will include, for a complete coverage of both chest and abdomen, doses of Levaquin and Flagyl.  DISCUSSION:  Patient, by  chest x-ray following admission, had the suggestion of right upper lobe infiltrate.  However, overlapping shadows on the chest  x-ray made interpretation difficult.  This will be repeated the day following admission, as well as abdominal films and the ultrasound requested as noted above.  It may be that his discomfort is related to his pneumonia with an associated abdominal ileus and colicky discomfort related to that, as well as bloating.  Cannot rule out the possibility of diverticulitis at the present time. Dictated by:   Lenon Curt. Cassell Clement, M.D. Attending Physician:  Kimber Relic DD:  05/02/01 TD:  05/03/01 Job: [id number]

## 2010-07-10 NOTE — Cardiovascular Report (Signed)
[place]. [facility]  Patient:    Dustin [name], DEVINCENT Visit Number: [id number] MRN: [id number]          Service Type: CAT Location: [phone] Attending Physician:  [name] Dictated by:   [name]. [name], M.D. Proc. Date: 12/12/00 Admit Date:  12/12/2000   CC:         [name], M.D.  Cardiac Catheterization Laboratory   Cardiac Catheterization  REFERRING PHYSICIAN: [name], M.D.  PROCEDURES: 1. Left heart catheterization. 2. Coronary cineangiography. 3. Left ventricular cineangiography. 4. Angioplasty with percutaneous transluminal coronary angioplasty Cutting    Balloon of the mid circumflex lesion. 5. Right brachial artery sheath insertion.  INDICATIONS FOR PROCEDURE: This 77 year old male has a history of three-vessel coronary artery disease and is status post coronary artery bypass graft surgery of his right coronary artery. He had angioplasty of his proximal LAD in April of 2002 followed by a second unstable episode requiring recatheterization in June of 2002, finding very good appearance of the [pro proximal LAD and severe progression of disease in the mid circumflex. He then had angioplasty with stent placement of the mid circumflex lesion.  He has been noted to have total occlusion of his first obtuse marginal branch. He has become unstable again with the signs and symptoms which occurred prior to his last angioplasty in June 2002. He was readmitted for angiography and possible intervention. He has also had aortobifemoral bypass grafting, which has made it difficult to approach his cardiac catheterization from either groin. With this admission, we have planned to do a brachial approach.  DESCRIPTION OF PROCEDURE: After signing an informed consent, the patient was premedicated with 50 mg of Benadryl intravenously and brought to the cardiac catheterization lab.  His right brachial area was prepped and draped in a sterile  fashion and anesthetized locally with 1% lidocaine over the right brachial artery.  A short 6 French introducer sheath was inserted percutaneously into the right branchial artery. The 6 [place] #4 Judkins coronary catheters were used to make injections into the native coronary arteries. The right coronary catheter was used to make injections into the right coronary artery vein graft.  A 6 French pigtail catheter was used to measure pressures in the left ventricle and aorta and to make a mid stream injection into the left ventricle. After noting severe critical re-stenosis within the mid circumflex stent, we discussed this finding with Dr. Caprice Kluver, and the patient and all agreed to approach this with repeat angioplasty with Cutting Balloon within the stent.  We then inserted a 6 [place] guide catheter and advanced it to the root of the aorta. After inserting a short Patriot guide wire, into the guide catheter, the tip of the catheter was engaged in the ostium of the left coronary artery. The guide wire was then advanced into the circumflex and easily passed through the lesion within the mid circumflex stent.  After positioning the tip of the guide wire in the distal circumflex, we then selected a 2.5 x 10 mm Cutting Balloon catheter, which after proper preparation was inserted over the guide wire and after moderate difficulty it was placed within the mid circumflex lesion within the stent. Three inflations were made at 10 atmospheres each in the distal portion of the stent obtaining a maximum time of 40 seconds. The Cutting Balloon was then withdrawn to the proximal portion of the stent and again three inflations were made, each at 10 atmospheres and a maximum time  of 23 seconds. After these inflations were made in the proximal and distal portion of the stent, the Cutting Balloon was removed and injection again into the left coronary artery showed an excellent angiographic result with 0%  residual lesion and no evidence of dissection or clot. All side branches were patent with normal antegrade flow. The patient tolerated the procedure well and no complications were noted. At the end of the procedure, the catheter was removed from the right branchial sheath and the sheath was sutured in place using 1-0 silk.  He was then admitted to the [facility] for monitoring overnight and will anticipate discharge in the a.m. The sheath will be removed when the ACT lowers to an acceptable level.  MEDICATIONS GIVEN: A total of 3000 units of heparin intraarterial in the right brachial artery, 6000 units of heparin intravenous prior to the angioplasty procedure, Integrilin drip per pharmacy protocol, metoprolol 5 mg IV at the end of the procedure.  HEMODYNAMIC DATA: Left ventricular pressure 171/20-35, aortic pressure 171-83 with a mean of 115.  Left ventricular ejection fraction was estimated at 50%. CINE FINDINGS:  CORONARY CINEANGIOGRAPHY: Left coronary artery: The ostium and left main appear normal.  Left anterior descending: The proximal LAD now appears normal without significant re-stenosis within the stent at maximum 10-15% re-stenosis There was normal antegrade flow throughout the LAD system and there were several previously described lesions within the LAD including a 60% focal stenosis just distal to the proximal stent. A 50% focal stenosis in the mid LAD and a 95% focal stenosis in the distal LAD at the apex. The first large diagonal branch has a mild 30-40% stenosis in its proximal segment prior to a large bifurcation.  Circumflex coronary artery: The proximal circumflex appears normal. The first large obtuse marginal is totally occluded in its proximal segment and mid and distal segment fills retrograde from the distal right coronary artery. The mid circumflex had critical re-stenosis within the stent with segmental 95%  stenosis throughout the length of the stent. There was  TIMI-2 antegrade flow in the distal circumflex.  Right coronary artery: The right coronary artery is chronically occluded near its origin. The mid and distal segments fill by way of sequential vein graft supplying the right ventricular branch and the posterior descending. The distal first obtuse marginal branch fills retrograde during these injections.  RIGHT VENTRICULAR CINEANGIOGRAM: Right coronary artery vein graft: This vein graft is a sequential graft which appears to be normal without lesion and with normal TIMI-3 antegrade flow. The mid graft anastomoses is to a large right ventricular branch and the distal anastomosis is to the posterior descending. The right coronary artery is totally occluded in its middle segment also and non-visualized between the acute angle and posterior descending.  LEFT VENTRICULAR CINEANGIOGRAM: The left ventricular chamber size is mildly enlarged. The overall left ventricular contractility is mildly decreased with an ejection fraction estimated at approximately 50%. There is moderate inferior basilar akinesia. The mitral and aortic valves appear normal.  ANGIOPLASTY CINE: Cine taken during the angioplasty procedure shows proper positioning of the guide wire and balloon catheter. Final injections into the left coronary artery showed an excellent angiographic result with 0% residual stenosis within the stent and reestablishment of normal TIMI-3 antegrade flow. There was no evidence of dissection or clot.  FINAL DIAGNOSES: 1. Severe re-stenosis within the mid circumflex stent. 2. Successful angioplasty with percutaneous transluminal coronary angioplasty    Cutting Balloon within the mid circumflex lesion using a 2.5 balloon. 3.  Otherwise no significant change from the prior study in June 2002. 4. Fair left ventricular function. Ejection fraction 50% with moderate    inferobasilar akinesia. 5. Normal vein graft to the right coronary artery with good  distal flow. 6. Chronic total occlusion of the first obtuse marginal which fills retrograde    from the distal right coronary artery. 7. Left anterior descending has very good appearance of the proximal stent and    has a 60% stenosis just distal to the stent, a 50% focal mid left anterior    descending and a 95% distal left anterior descending lesion at the apex    with good antegrade flow throughout. 8. Very good branchial artery pulses throughout the procedure.  DISPOSITION: The patient will be continued on the Integrilin drip and the right brachial sheath will be removed when the ACT returns to an acceptable level. We will hopefully be able to discharge in the a.m. Dictated by:   [name]. [name], M.D. Attending Physician:  [name] DD:  12/12/00 TD:  12/13/00 Job: [id number]

## 2010-07-10 NOTE — Cardiovascular Report (Signed)
[place]. [facility]  Patient:    Dustin [name], Dustin [name]                          MRN: [id number] Adm. Date:  [id number] Attending:  Loreli Dollar CC:         [name], M.D.  CP Lab  Runell Gess, M.D.  Mindi Slicker. Lowell Guitar, M.D.  Lenon Curt Cassell Clement, M.D.   Cardiac Catheterization  PROCEDURES:  Retrograde central aortic catheterization; selective coronary angiography pre- and post intracoronary nitroglycerin administration; left ventricular angiogram RAO, LAO projection; subselective left internal mammary artery; saphenous vein graft angiography; abdominal aortic angiogram midstream PA projection; Aggrastat bolus plus infusion, p.o. Plavix and continued aspirin; percutaneous coronary intervention high-grade "culprit lesion;" percutaneous transluminal coronary angioplasty and small-vessel stent, subtotal mid-circumflex in a setting of recent SCDMI less than 24 hours.  DISCUSSION:  Dustin [name] is a very pleasant 77 year old white, married father of five with 13 grandchildren and six great-grandchildren.  He is a nonsmoker. He has a history of prior myocardial infarction (DMI) in 1993.  He had aneurysm resection by Dr. Hart Rochester with an aortobi-iliac bypass in 1994.  He subsequently underwent CABG x 2 in 1995 for predominant single-vessel RCA disease.  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  He was very sensitive to beta blockers in the past, is being treated for hypertension, hyperlipidemia, and LV dysfunction.  He underwent LAD angioplasty and stent of an 80% proximal LAD lesion May 24, 2000, by Dr. Clarene Duke.  He is referred now for recatheterization in the setting of chest pain of approximately 12-18 hours duration compatible with ischemia, without ST segment elevation, and elevated CPK-MB to 14, CPK to 202, and  troponin to 0.17, compatible with small non-Q MI.  Because of difficult access problems in the past secondary to his aortoiliac bypasses, both groins and the arm were prepared.  DESCRIPTION OF PROCEDURE:  The patient was brought to the second floor CP lab after 5 mg Valium p.o. premedication.  The left groin had a good pulse and was prepped.  Xylocaine 1% was used for local anesthesia, and the RFA native artery was entered with a single anterior puncture using an 18 thin-walled needle.  A 6 French short Daig sidearm sheath was inserted.  A Wholey wire was used to ascertain the _____ showed an anterior anastomosis to the LCIA.  The [facility] wire was then directed into the graft and was able to pass into the abdominal aorta.  Guidewire exchange was used throughout the remainder of the procedure.  Diagnostic coronary angiography was done with 6 French, 4 cm taper Cordis preformed coronary and pigtail catheters with Omnipaque dye.  The patient was given intermittent Nubain for sedation.  He did not require any heparin, monitoring his ACTs, and was given 150 mg of Plavix.  Coronary angiography was followed by subselective LIMA and then saphenous vein graft angiography with the right coronary catheter.  LV angiogram was done in the RAO and LAO projection at 25 cc, 14 cc per second, 20 cc, 12 cc per second. Pullback pressure to the CA showed no gradient across the aortic valve. Abdominal aortic angiogram was then done in the midstream PA projection above the level of the renal arteries and the aortic graft at 30 cc, 20 cc per second, with  follow-through to the left groin.  This demonstrated a widely patent infrarenal aortobi-external iliac graft with patent limbs bilaterally. The native hypogastrics were intact and filled by retrograde flow.  The SFA profunda on the left was intact.  The renal arteries were single and normal bilaterally, and the SMA and celiac axis was normal.  LV angiogram in  the RAO and LAO projection showed akinesis of the basilar third of the inferior wall, hypokinesis to mid-inferior wall, and hypo-akinesis of the posteroapical wall.  The anterolateral and septum contracted well, and overall EF was approximately 40%.  There was no mitral regurgitation present.  RESULTS:  Pressures:  LV:  170/0; LVEDP 18-22 mmHg.  CA:  170/85 mmHg.  There was no gradient across the aortic valve on catheter pullback.  Subselective LIMA revealed irregularities of the LSCA.  Approximately 50-70% concentric narrowing of the ostium and proximal portion of the left vertebral but antegrade flow was present.  The LIMA was well-visualized with antegrade flow (nongrafted vessel).  The right coronary artery was totally occluded after a very proximal small atrial branch.  The main left coronary was normal.  The left anterior descending had a previously-placed, excellent, well-expanded 3.0/15 NIR stent ([id number]) with less than 20% luminal narrowing.  There was a step-down just past that with approximately 50% concentric narrowing but good residual lumen.  The LAD then gave off a large diagonal branch that had 60-70% narrowing at its ostia and proximal portion that was unchanged, with good flow to a bifurcating vessel with a 70% lesion in the mid-branch.  The LAD then had 60-70% eccentric narrowing in the midportion and a 95% narrowing that was unchanged before the apical bifurcation.  There was good TIMI 3 flow throughout the LAD, and the septal perforators were intact, including that arising from the stented area.  The circumflex artery gave off a small first marginal (OD branch) that was patent and normal.  The second marginal was occluded very proximally with no antegrade filling.  There was then a third marginal branch that was occluded.  This was in the mid-circumflex after the patent left atrial branch.  There was 95-99% stenosis of this, which was a new lesion.  Beyond  this, there was filling of the distal bifurcating marginal and the PAVG branch, which were of moderate size.  Saphenous vein graft was anastomosed to the RV marginal branch in its midportion in the distal anastomosis to the PDA.  There was an excellent graft that was smooth, widely patent, with no stenosis and good filling of the PDA. There was retrograde filling of the PLA, and then there were collaterals to a thin, long marginal branch.  The patients culprit lesion was felt to be his progression of disease with new high-grade mid-circumflex stenosis.  The cines were reviewed with Dr. Clarene Duke, and it was elected to proceed with attempted PCI.  The patient was given Aggrastat bolus plus infusion and was given 150 mg of Plavix in the lab. ACTs were measured throughout the procedure.  He was on IV nitroglycerin at 4-5 microdrops, and received intracoronary nitroglycerin 200 mcg x 2.  The left coronary artery was intubated using guidewire exchange with an ACS GL3.5 6 [place] guiding catheter.  An ACS 0.010 inch CrossIt 100 XT short guidewire was then used in combination with a 2.0/15 CrossSail balloon.  The guidewire was able to cross the high-grade stenosis and was positioned in the distal circumflex marginal branch.  Balloon inflation was done at 6-26 and 7-30.  The balloon was then exchanged for an ACS small vessel Pixel 2.25/13 stent.  This was positioned before the origin of the PAVG branch, across the stenosis, and deployed at 10 atmospheres for 30 seconds.  Redilated at 13-28 (equivalent to 2.4 mm).  The balloon was pulled back, and final injection showed stenosis reduction of 95% or greater to essentially 0%.  There was 40% narrowing beyond this but good flow to the PAVG branch and to the bifurcating distal circumflex.  The dilatation system was removed.  Catheter was removed over a guidewire.  Sidearm sheath was flushed and secured to the skin to prevent migration, and the patient  was sent to the holding area for postoperative care.  We plan to continue Aggrastat for 18 hours, aspirin and Plavix.  Medical therapy of his residual LAD disease, which is noncritical, with excellent proximal LAD stent.  Excellent sequential grafts to the RV, AM branch, and PDA, and successful PTCA and stent of culprit mid-high grade circumflex stenosis.  CATHETERIZATION DIAGNOSES:  1. Atherosclerotic heart disease, non-ST elevation myocardial infarction     (PMI) approximately 18 hours.  2. Successful culprit lesion percutaneous transluminal coronary angioplasty     and stent mid-circumflex, nondominant vessel, moderate size.  3. Patent left anterior descending stent with residual disease left anterior     descending mid- and large diagonal, and left anterior descending distal as     described.  4. Left ventricular dysfunction secondary to prior DMI and Adriamycin     toxicity.  5. History of promyelocytic leukemia responding to chemothrapy, including     Adriamycin 1997.  6. Status post remote DMI 1993.  7. Aortic aneurysm resection with infrarenal aortobi-iliac graft, Dr. Hart Rochester,     1994.  8. Status post coronary artery bypass grafting x 2 (single graft) 1995, Dr.     Andrey Campanile.  9. Hypertension. 10. Hyperlipidemia. DD:  08/15/00 TD:  08/16/00 Job: [id number]

## 2010-07-10 NOTE — Cardiovascular Report (Signed)
[place]. [facility]  Patient:    GARRETTE, CAINE                          MRN: [id number] Proc. Date: 05/24/00 Adm. Date:  [id number] Disc. Date: [id number] Attending:  Loreli Dollar CC:         Lenon Curt. Cassell Clement, M.D.   Cardiac Catheterization  INDICATIONS:  Mr. Mitrano is a 77 year old male who had bypass surgery in 1995 who presented with increasing episodes of exertional chest pain.  He had a positive nuclear study showing anterior ischemia.  He is brought to the cardiac catheterization lab.  He has had a previous abdominal aortic aneurysm repair.  DESCRIPTION OF PROCEDURE:  The patient was prepped and draped in the usual sterile fashion exposing both groins.  Initially the left groin was entered, but I could not pass a guide wire up the iliac as it ended in a blind pouch. I suspect this is related to his surgical repair of his aneurysm.  I then entered the right femoral artery using the Seldinger technique and finally wit the aid of a guide wire, I was able to get up the graft into the distal aorta, and the case was done without difficulty after that.  Wire exchange techniques were used.  There clearly was a blind pouch on the right side also.  Selective right and left coronary arteriography, graft visualization, ventriculography in the RAO projection, and percutaneous intervention to the proximal LAD was performed.  PROCEDURES: 1. Left heart catheterization. 2. Selective right and left coronary arteriography. 3. Ventriculography. 4. Graft visualizations including saphenous vein graft to the right coronary    artery.  RESULTS: 1. Hemodynamic monitoring:  Central aortic pressure 180/68.  Left ventricular    pressure 180/16.  No significant aortic valve gradient noted at time of    pullback. 2. 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  The inferior wall was severely hypokinetic, and the  apex    was hypokinetic.  The ejection fraction was approximately 30%.  End    diastolic pressure was 14 (it should be pointed out that the patient had    been treated with Adriamycin for leukemia and had some degree of LV    dysfunction based on Adriamycin toxicity). 3. Coronary arteriography    a. Left main:  Normal.    b. LAD:  The LAD crossed the apex of the heart.  The entire vessel was       diffusely narrow with proximal 80% focal narrowings.  The remainder of       the proximal and mid portion had sequential 50% and 60% areas of       narrowing.  The distal portion of the LAD at the apex was subtotaled and       was not amenable to percutaneous intervention because of the small       caliber of the vessel and the small amount of ongoing LAD past this       area.    c. Circumflex:  The first OM was 100% occluded proximally.  The second OM       was diffusely diseased with 50% and 60% areas of narrowing.    d. Right coronary artery:  Is 100% occluded proximally.    e. Saphenous vein graft sequential to the RCA and OM:  The graft itself was  widely patent.  The PDA was patent.  The OM was patent.  It had       collaterals to the distal LAD from the OM.  Because of the high grade stenosis in the proximal portion of the LAD, arrangements were made for intervention.  A 3.5 JL4 7-French guide catheter was used, 182-cm luge, and a 3.0- x 15-mm NIRoyal stent.  The stent was placed in such a manner that both the proximal and distal segments of the lesion were covered.  It was initially inflated at 12 for 60, and final inflation was 15 for 62.  The initial area was 75-80% narrowed.  Following stent placement, the vessel appeared to be normal at the area that was fixed although the distal vessel was diffusely diseased.  There was brisk distal flow with no evidence of any dissection or thrombus.  The patient was given 150 mg of oral Plavix and Integrilin double bolus, given a total of  4900 units of heparin, and 40 mg of IV Lasix, and he put out 1000 cc during the catheterization as a result of the Lasix.  The sheath will be removed later today.DD:  06/27/00 TD:  06/27/00 Job: [id number]

## 2010-07-10 NOTE — Cardiovascular Report (Signed)
[name], [name]:  [id number]   MEDICAL RECORD NO.:  [id number]                   PATIENT TYPE:  OIB   LOCATION:  6525                                 FACILITY:  [facility]   PHYSICIAN:  John R. Tysinger, M.D.              DATE OF BIRTH:  12/08/1927   DATE OF PROCEDURE:  05/10/2002  DATE OF DISCHARGE:  05/11/2002                              CARDIAC CATHETERIZATION   PROCEDURES:  1. Left heart catheterization.  2. Coronary cineangiography.  3. Vein graft cineangiography.  4. Left internal mammary artery cineangiography.  5. Left ventricular cineangiography.  6. Primary stent placement in the mid-left anterior descending coronary     artery.  7. Perclose of the right femoral artery.   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  He has also had stent  placement in his proximal LAD and mid-circumflex with subsequent reclosure  of his mid-circumflex lesion requiring angioplasty with cutting balloon on  12/13/02.  He now returns with exertional angina and a stress test was early  positive, showing evidence of ischemia.  He was rescheduled for tests at a  time slot where brachytherapy would be available if restenosis within the  stent has occurred again.   DESCRIPTION OF PROCEDURE:  After signing an informed consent, the patient  was premedicated with 5 mg of Valium by mouth and brought to the cardiac  catheterization lab.  His right groin was prepped and draped in a sterile  fashion and then anesthetized locally with 1% lidocaine.  A 6 French  introducer sheath was inserted percutaneously into the right femoral artery.  Six [place] #4 Judkins coronary catheters were used to make injections into  the native  coronary arteries.  The right coronary catheter was used to make  injections into the right coronary artery sequential vein graft.  It was  also used to make a midstream injection into the left subclavian artery,  visualizing the left internal mammary artery.  A 6 French pigtail catheter  was used to measure pressures in the left ventricle and aorta and to make a  midstream injection into the left ventricle.  After noting a progression of  disease in his mid-LAD lesion previously described as 50%, now increased to  90%, we discussed this finding with the patient along with a good appearance  of his proximal LAD stent and mid-circumflex stent.  With the new 90% lesion  in his right coronary artery, we elected to proceed with primary stenting of  his lesion.  We selected a 6 [place] guide catheter with a short HTF  guidewire with the guidewire being easily passed through the mid-LAD lesion  and  positioned distally.  After sizing the lesion, we selected a 3.0 x 12 mm  Taxus Express II stent  system and after proper preparation, this was  inserted over the guidewire and positioned within the mid-LAD lesion.  The  stent was then deployed with one inflation at 16 atmospheres for 36 seconds.  After the stent was deployed, the deployment balloon was removed and a final  injection into the left coronary artery showed an excellent angiographic  result with 0% residual lesion and normal antegrade flow.  The patient  tolerated the procedure well and no complications were noted.  At the end of  the procedure the catheter and sheath were removed from the right femoral  artery and hemostasis was easily obtained with the Perclose closure system.   MEDICATIONS GIVEN:  1. Heparin, a total of 6100 units IV.  2. Integrilin drip per pharmacy protocol.   HEMODYNAMIC DATA:  Left ventricular pressure 141/16 __________ 4.  Aortic  pressure 145/81 with a mean of 107.  Left ventricular ejection fraction was   estimated between 30 and 40.   CINEANGIOGRAPHY FINDINGS:  CORONARY CINEANGIOGRAPHY:  Left coronary artery:  The ostium and left main  appear normal.  Left anterior descending:  The proximal segment is the site  of a prior stenting and now has a very good long-term appearance.  The mid-  LAD lesion has now progressed to a focal 90% concentric stenosis and the  distal LAD has only mild irregularities.  Circumflex coronary artery:  The  mid-circumflex is the site of the prior stenting and subsequent restenosis  with cutting balloon angioplasty.  This site now has a good long-term  appearance from 12/12/00.  There is normal antegrade flow.  Right coronary  artery:  The right coronary artery is totally occluded at its origin.  The  distal right coronary artery fills by way of vein graft.   VEIN GRAFT CINEANGIOGRAPHY:  Right coronary artery sequential vein graft:  This graft appears normal with all three normal distal anastomotic sites  being patent and functioning normally.  Left internal mammary artery:  The  LIMA appears normal.   LEFT VENTRICULAR CINEANGIOGRAM:  The left ventricular chamber size is mild  to moderately enlarged.  The overall left ventricular contractility is  moderately decreased with an ejection fraction between 30 and 40%.  The  mitral valve shows mild mitral insufficiency.  The aortic valve appears  normal.   ANGIOPLASTY CINEANGIOGRAPHY:  Cines taken during the angioplasty procedure  showed proper positioning of the guidewire and stent deployment system.  Injections following stent deployment showed an excellent angiographic  result with 0% residual lesion and no evidence for dissection or clot.  There is normal TIMI-3 antegrade flow.   FINAL DIAGNOSES:  1. Three-vessel coronary artery disease.  2. Progression of disease in the mid-left anterior descending lesion from     50% to 90% since October 2002. 3. Good appearance of the right coronary artery sequential vein  graft.  4. 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  5. Very good appearance of the proximal left anterior descending coronary     stent.  6. Very good appearance of the mid-circumflex stent.  7. Moderate left ventricular dysfunction, ejection fraction 30-40%.  8. Normal left internal mammary artery.  9. Successful percutaneous transluminal coronary angioplasty with primary     stenting of the mid-left anterior descending coronary artery lesion.  10.      Successful Perclose of the right femoral artery.  DISPOSITION:  The patient will be monitored on the EAD overnight with  continuation of the Integrilin drip for 18 hours.  Will anticipate discharge  in the a.m.                                               John R. Tysinger, M.D.    JRT/MEDQ  D:  05/10/2002  T:  05/12/2002  Job:  [id number]   cc:   [facility]

## 2010-07-10 NOTE — Op Note (Signed)
   NAMEVALLEN, CALABRESE NO.:  [id number]   MEDICAL RECORD NO.:  [id number]                   PATIENT TYPE:  OIB   LOCATION:  2887                                 FACILITY:  [facility]   PHYSICIAN:  Quita Skye. Hart Rochester, M.D.               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   OPERATION:  Ligation of AV fistula right brachial artery and vein with  repair of right brachial artery.   SURGEON:  Quita Skye. Hart Rochester, M.D.   FIRST ASSISTANT:  Levin Erp. Steward, P.A.   ANESTHESIA:  Local anesthesia.   DESCRIPTION OF PROCEDURE:  The patient was taken to the operating room and  placed in the supine position at which time the right upper extremity was  prepped with Betadine scrubbing solution, draped in routine sterile manner.  After infiltration with 1% Xylocaine with epinephrine, a short longitudinal  incision was made just proximal to the antecubital space where the  continuous bruit was noted and the fistula had been demonstrated.  Dissection carried down between the tissue planes and the brachial artery  was exposed.  It was encircled with a Vesseloops and the fistula was located  with the Doppler.  The artery was mobilized away from the adjoining brachial  vein.  There was a posterior connection between the artery and the vein.  The vein was ligated proximal and distal to this and the artery repaired  with 6-0 Prolene suture.  No evidence of any persistent fistula was noted by  Doppler examination.  The wound was closed with Vicryl in subcuticular  fashion with Steri-Strips. Sterile dressing was applied and the patient was  taken to the recovery room in satisfactory condition.                                          Quita Skye Hart Rochester, M.D.    JDL/MEDQ  D:  03/01/2002  T:  03/01/2002  Job:  [id number]

## 2010-07-10 NOTE — Consult Note (Signed)
[place]. [facility]  Patient:    Dustin [name], Dustin [name]                          MRN: [id number] Proc. Date: 08/16/00 Adm. Date:  [id number] Attending:  Loreli Dollar CC:         [name], M.D.   Consultation Report  REASON FOR CONSULTATION: Rule out false aneurysm, left femoral artery, post cardiac catheterization.  HISTORY OF PRESENT ILLNESS: I appreciate seeing this 77 year old male patient in consultation regarding sudden onset of pain and swelling in the left inguinal region following cardiac catheterization.  This patient began having new cardiac symptoms recently and underwent cardiac catheterization on August 15, 2000 and was found to have focal proximal stenosis of the LAD, and underwent PTA and stenting successfully via the left common femoral approach. The patient had previously had abdominal aortic aneurysm resection repair by me in 1994 and underwent aorto-bi external iliac grafting.  He has done well since that time with no difficulties or pain.  Today following the onset of pain and swelling in the left inguinal region a duplex scan was performed which revealed a pseudoaneurysm in the left inguinal region which extended medially and into the suprapubic area, with some concern over this involving the intra-abdominal aortic graft.  PAST MEDICAL HISTORY:  1. Positive for previous leukemia, which is now in remission.  2. Previous coronary artery bypass grafting in 1995.  3. History of CVA in 1989, with good improvement in his symptoms, which was     left hemispheric.  4. History of GI bleed secondary to nonsteroidal anti-inflammatory drugs.  5. History of hiatal hernia with reflux esophagitis.  He denies any new CVA, TIAs, amaurosis fugax, lower extremity deep vein thrombosis or thrombophlebitis.  He does have lower extremity weakness from the hips to the calves with walking, which has been present for several years despite good  circulation.  ALLERGIES: CODEINE.  Beta-blockers have been known to cause profound bradycardia.  MEDICATIONS:  1. Zestril.  2. Lasix.  3. Altace.  4. Nitroglycerin.  5. Vitamin E.  6. Aspirin.  7. Zoloft.  9. Prilosec. 10. Toprol XL. 11. Lipitor.  FAMILY HISTORY: Positive for CVA in his father.  Mother with abdominal aortic aneurysm and diabetes mellitus.  SOCIAL HISTORY: Negative for tobacco, with occasional alcohol.  REVIEW OF SYSTEMS: See History and Physical in chart.  PHYSICAL EXAMINATION:  VITAL SIGNS: Blood pressure 120/60, heart rate 53, respirations 12, temperature 98.7 degrees.  GENERAL: He is a healthy male patient who is in no apparent distress.  He is alert and oriented x 3.  NECK: Supple, 3+ carotid pulses.  NEUROLOGIC: Normal.  EXTREMITIES: Upper extremity pulses 3+ bilaterally with no ischemia noted. Lower extremities reveal 3+ femoral, popliteal, and posterior tibial pulses bilaterally, with well perfused lower extremities.  The left inguinal region has diffuse ecchymosis dissecting around and about a 5-6 cm circumference. There is a tender pulsatile area extending anteriorly and medially from just cephalad to the puncture site, which is approximately 3 cm in diameter.  This is about the level of the inguinal ligament.  The right leg is uninvolved.  CHEST: Clear to auscultation and percussion.  There is a well-healed median sternotomy incision.  ABDOMEN: Soft, nontender, no masses or evidence of ventral hernia.  No pulsatile masses in the abdomen are noted.  IMPRESSION:  1. Probable pseudoaneurysm from cardiac catheterization site in left femoral  artery.  2. Coronary artery disease, status post percutaneous transluminal coronary     angioplasty and stenting of left anterior descending stenosis, status post     coronary artery bypass grafting.  3. Status post abdominal aortic aneurysm repair in 1994.  4. Status post leukemia, in remission  currently.  5. Hiatal hernia with reflux esophagitis.  6. History of gastrointestinal bleeding.  RECOMMENDATIONS: I am concerned that this could represent an existing pseudoaneurysm from the anastomosis of the left limb of the aorto-bi external iliac graft to the iliac artery which has expanded from above, although this is unlikely.  Will need another imaging study to confirm this to plan surgical approach.  Will order MR angiogram to see if this delineates this well enough, if not will be obtain CT scan or angiogram. DD:  08/16/00 TD:  08/17/00 Job: [id number]

## 2010-07-10 NOTE — Discharge Summary (Signed)
[place]. [facility]  Patient:    Dustin [name], Dustin [name]                          MRN: [id number] Adm. Date:  05/24/00 Disc. Date: 05/26/00 Attending:  [name], M.D. CC:         Lenon Curt. Cassell Clement, M.D.                           Discharge Summary  ADMISSION DIAGNOSIS:  Coronary artery disease with abnormal nuclear study.  DISCHARGE DIAGNOSES: 1. Coronary artery disease. 2. Peripheral vascular disease. 3. Hyperlipidemia. 4. Adult onset diabetes mellitus. 5. History of leukemia.  PROCEDURES:  Left heart catheterization with graft visualization and extending to the native LAD.  COMPLICATIONS:  None.  DISCHARGE STATUS:  Stable.  ADMISSION HISTORY:  The patient is basically a 77 year old male who had bypass surgery in 1995 and had done reasonably well until about five weeks prior to admission when he began having episodes of chest discomfort that he describes as relatively minimal pressure in his chest, but it was clearly new.  A nuclear study showed anterior ischemia and because of this he was brought in for outpatient cardiac catheterization.  The day prior to his cardiac catheterization, he mowed the yard, had five discrete episodes of chest pain all requiring him to sit and rest.  Each one lasted about 15 minutes, but he continued to cut the grass.  PHYSICAL EXAMINATION ON ADMISSION:  CARDIOVASCULAR:  Cardiac exam revealed a regular rhythm with no ectopic beats.  RESPIRATORY:  His lungs were clear.  NECK:  Neck veins were not distended.  No carotid bruits were heard.  SKIN:  There was a well healed anterior chest surgical scar and an abdominal scar with no abdominal bruits or pulsatile masses.  EXTREMITIES:  He had good pulses in the upper and lower extremities.  HOSPITAL COURSE:  The patient was brought in as an outpatient for cardiac catheterization which was performed via the right femoral artery.  He actually had had both his iliac  arteries ligated at the time of an aortic aneurysm repair and had aortofemoral bypasses.  I was finally able after entering both iliacs to get into the femoral artery on the right, go through the graft, and into the ascending aorta.  Once this was accomplished, the catheterization was performed with no difficulties.  His right coronary artery was 100% occluded. The saphenous vein graft that was sequential to the RCA and OM was widely patent as was the PDA and OM.  His LAD which crossed the apex of the heart was diffusely diseased, but had an 80% focal area in the proximal portion.  There was a distal area at the apex that was subtotaled, but was not amenable to any type of intervention.  His circumflex showed total occlusion of OM-1, and OM-2 had 50-60% area of proximal narrowing.  This OM was covered by the graft.  His LV function was markedly decreased with ejection fraction of only 30%, severe inferior hypokinesia, mild hypokinesia of the apex, and a posterior basilar aneurysm.  Arrangements were made for intervention.  A 3.5 guide catheter was used and a 3.0 x 15 [place] stent was placed in the proximal LAD and deployed without difficulty.  The vessel which had a focal area of high-grade stenosis appeared to be normal with the distal vessel still being diffusely disease.  There  was no evidence of any distal embolization, dissection, or thrombus formation.  The patient was treated with Integrilin during the procedure and maintained for 12 hours.  He was also treated with heparin and Plavix.  He had some shortness of breath and was given 40 of Lasix and put out 1000 cc during the catheterization.  Because of diffuse disease in his LAD, he was started on Toprol.  His lipid panel which had been checked as an outpatient came back with an LDL of 226 and a total cholesterol of 309.  He was started on 10 of Lipitor with anticipations this will need to be increased as an outpatient.  His  heart rate after the cardiac catheterization was in the 40s, and because of this we had to decrease his Toprol from 25 to 12.5 mg once q.d.  His EKG postprocedure showed diffuse T wave abnormalities which were new from his cardiogram that had been done in my office.  His cardiac enzymes were borderline elevated with a CK of 197 and 5 MB units and a troponin of 0.17.  I am concerned that the patient may have had a non-Q-wave anterior lateral myocardial infarction the day prior to the cardiac catheterization in view of his symptoms.  His EKG at the time of discharge shows T wave abnormalities in I, aVL, and V4, V5, and V6.  Because of this abnormality, he was maintained in the hospital for an additional 24 hours.  He had no significant arrhythmias other than the beta-blockers and is felt to be ready for discharge.  DISCHARGE MEDICATIONS:  1. Zoloft 100 mg once q.d.  2. Zestril 20 mg once q.d.  3. Lasix 20 mg once q.d.  4. Protonix 40 mg one q.d.  5. Toprol 12.5 mg one-half tablet q.d.  6. Enteric-coated aspirin 325 mg once q.d.  7. Plavix 75 mg once q.d.  8. Actos 30 mg once q.d.  9. Lipitor 10 mg once q.d. 10. Sublingual nitroglycerin p.r.n.  DISCHARGE INSTRUCTIONS:  His activities are limited times the next 48 hours and then gradual return to normal activity over the next 10 days.  He is instructed to stay on a low-salt, low-fat, diabetic diet.  To do no heavy lifting or straining of the next 48 hours because of his cath site which was well healed.  DISCHARGE FOLLOWUP:  He is instructed to make a follow-up appointment to see me in the office in three weeks. DD:  05/26/00 TD:  05/26/00 Job: [id number]

## 2010-07-10 NOTE — Op Note (Signed)
[place]. [facility]  Patient:    DON, TIU                          MRN: [id number] Proc. Date: 08/18/00 Attending:  Quita Skye. Hart Rochester, M.D.                           Operative Report  PREOPERATIVE DIAGNOSES:  Left femoral pseudoaneurysm.  POSTOPERATIVE DIAGNOSES:  Left femoral pseudoaneurysm.  OPERATION:  Repair of left superficial femoral pseudoaneurysm.  SURGEON:  Quita Skye. Hart Rochester, M.D.  FIRST ASSISTANT:  Gina H. Thomasena Edis, P.A.  ANESTHESIA:  General endotracheal.  PROCEDURE:  Patient was taken to the operating room, placed in the supine position at which time satisfactory general endotracheal anesthesia was administered.  The left inguinal region was prepped with Betadine scrub and solution and draped in a routine sterile manner.  There was diffuse ecchymosis throughout the inguinal region and a pulsatile mass extending anteriorly and medially in the suprapubic region which had been documented to be arising from the proximal superficial femoral artery by an MR angiogram the day before.  A longitudinal incision was made through this area.  The superficial femoral artery was exposed distally and encircled with the vessel loop.  It was then traced proximally until the pseudoaneurysm was entered.  There was a hole in the anterior aspect of the superficial femoral artery which was not large and the profunda femoris artery was dissected free as well.  The common femoral artery was encircled proximally for control.  After control of the artery proximally 5000 units of heparin was given intravenously and the arteries were occluded temporarily with vascular clamps.  The opening in the superficial femoral artery was carefully identified.  There was no evidence of any thrombus in the vessel and it was repaired with several 6-0 Prolene sutures in an interrupted fashion quite satisfactorily.  The clamps were then released and there was an excellent pulse and  Doppler flow in both the superficial femoral and the profunda femoris artery and a palpable posterior tibial artery, arterial pulse in the ankle.  Protamine was given to reverse the heparin.  Hemovac drain brought out through an inferiorly based stab wound, was carried to the nylon suture, and the wound closed with Vicryl and Steri-Strips.  Sterile dressing applied.  Patient taken to the recovery room in satisfactory condition. DD:  08/18/00 TD:  08/18/00 Job: [id number]

## 2010-07-10 NOTE — Discharge Summary (Signed)
[place]. [facility]  Patient:    Dustin [name], Dustin [name]                          MRN: [id number] Adm. Date:  [id number] Disc. Date: [id number] Attending:  Loreli Dollar Dictator:   Adrian Saran, A.N.P. CC:         Quita Skye. Hart Rochester, M.D.  Lenon Curt Cassell Clement, M.D.   Discharge Summary  ADMISSION DIAGNOSES: 1. Chest pain, rule out myocardial infarction. 2. Diabetes. 3. Coronary artery disease, status post coronary artery bypass grafting x 3 in    1995 and multiple percutaneous transluminal coronary angioplasties and    stenting.  Last case in April of 2002. 4. History of leukemia, currently in remission. 5. Status post abdominal aortic aneurysm repair in 1994. 6. History of bradycardia secondary to beta blockers.  DISCHARGE DIAGNOSES: 1. Coronary artery disease, status post subendocardial myocardial infarction.    The cardiac catheterization on August 15, 2000, had stent placed to    circumflex. 2. Pseudoaneurysm repair of left proximal superficial femoral artery, status    post catheterization with surgical repair on August 18, 2000. 3. Cardiomyopathy with an ejection fraction of 45%. 4. Diabetes. 5. Bradycardia secondary to beta blockers.  PROCEDURES: 1. Cardiac catheterization. 2. Surgical repair of pseudoaneurysm of left proximal superficial femoral    artery.  COMPLICATIONS:  None.  DISCHARGE STATUS:  Stable.  ADMISSION HISTORY:  This is a 77 year old male who had a sudden onset of left anterior chest pain with radiation down both arms.  This pain awakened him from sleep at 1:30 a.m. on the morning of August 15, 2000.  He states that he took sublingual nitroglycerin x 3 every five minutes without any relief of his pain.  He denied any shortness of breath, however, did experience some mild nausea.  He denied any diaphoresis.  He also had a sensation of a "skipping and pounding" sensation to his chest.  He states that he has had some intermittent  episodes of anterior chest pain over the last couple of weeks prior to this admission.  These have been occurring mostly when he is supine.  He quantifies this current pain as similar to the pain he had prior to his last stent placement on May 24, 2000. He also has a history of reflux, however, he states that this pain is different from the discomfort he experiences with the reflux.  Due to his recurrent episodes of pain over the last several weeks, he was scheduled for an outpatient nuclear study on August 17, 2000.  However, with this current episode he presented to the emergency room.  PHYSICAL EXAMINATION ON ADMISSION:  Vital Signs:  The blood pressure was 121/62, pulse 53 and regular, and respirations 20.  He was afebrile and had an O2 saturation of 97% on room air. The monitor was showing a sinus rhythm with a rate in the 50s-60s with an occasional PVC.  The EKG obtained in the emergency room showed a sinus bradycardia with rate in the 50s with T-wave inversions in the lateral leads that were similar to his last tracing on August 04, 2000.  However, he did have slightly more changes to ST segments in the inferior leads since that last tracing.  Cardiac enzymes obtained in the emergency room were elevated with a total CK of 202 with 14 MB.  The troponin was 0.17.  HOSPITAL COURSE:  He was admitted to rule out MI.  He was sent to the catheterization lab that same day to check for etiological cause of chest pain and re-evaluation of grafts.  He was started on IV heparin and nitroglycerin. He was also on aspirin and started on p.o. Plavix.  The cardiac catheterization was performed on the day of admission, August 15, 2000.  Richard A. Alanda Amass, M.D., was consulted due to the possibility of having to perform brachytherapy due to the patients past history with catheterizations.  Dr. Alanda Amass performed catheterization, which showed diffuse disease of the LAD.  PCI was done, however, to  circumflex with apparently good results with 95% down to 0% and good flow distally.  It was determined that medical treatment would resume for residual disease.  LV function was calculated to be 40%.  The patient remained pain-free and without shortness of breath.  Mild tenderness noted at the catheterization site on August 16, 2000.  He was also noted to have a mild hematoma with a bruit.  Ultrasound evaluation was ordered for evaluation of the left groin bruit.  Anticipation for discharge the next morning pending cardiac enzymes.  Enzymes did come back elevated with a total CK of 128 with 14.6 MB.  The troponin was 1.39.  He was held overnight and CVTS was called for a consult in regards to the pseudoaneurysm to the catheterization site of the left groin.  It was determined by CVTS to surgically repair the pseudoaneurysm.  He was taken to the OR on August 18, 2000.  He remained stable and no complications status post repair.  He remained stable for the rest of the hospitalization.  He was up and ambulating without any chest discomfort or shortness of breath.  Still with mild tenderness to the left groin.  Hematoma gradually improving.  On August 20, 2000, he was discharged home.  DISCHARGE MEDICATIONS:  1. Plavix 75 mg q.d.  2. Zestril 20 mg q.d.  3. Lasix 20 mg q.d.  4. Actos 45 mg q.d.  5. Zoloft 100 mg q.d.  6. Prilosec 20 mg q.d.  7. Lipitor 10 mg q.d.  8. Enteric-coated aspirin 325 mg q.d.  9. Nitroglycerin 0.4 mg SL p.r.n. 10. Tylox one q.8h. p.r.n. 11. Phenergan 25 mg one q.8h. p.r.n.  ACTIVITY:  Activity will be limited for the next several days.  No strenuous activity for at least the next three weeks.  No heavy lifting more than 5 pounds.  DIET:  He is to follow a low-salt, low-fat, low-cholesterol, diabetic diet.  SPECIAL INSTRUCTIONS:  He is to discontinue taking his Toprol.   WOUND CARE:  He may shower and clean the groin area with some soap and water. He is to  make sure that the incision area is dry after showering.  FOLLOW-UP:  He is to follow up with Quita Skye. Hart Rochester, M.D., in approximately two weeks.  They will contact him with the appointment time.  He is to follow up with [name], M.D., three weeks.  He is to call for an appointment. DD:  08/30/00 TD:  08/30/00 Job: [id number]

## 2010-07-10 NOTE — Op Note (Signed)
   NAMEKhyrie, Dustin [name]                             ACCOUNT NO.:  [id number]   MEDICAL RECORD NO.:  [id number]                   PATIENT TYPE:   LOCATION:                                       FACILITY:   PHYSICIAN:  Quita Skye. Hart Rochester, M.D.               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   OPERATION:  Ligation of arteriovenous fistula, right arm with repair of  right brachial artery.   SURGEON:  Quita Skye. Hart Rochester, M.D.   FIRST ASSISTANT:  Levin Erp. Steward, P.A.   ANESTHESIA:  Local.   DESCRIPTION OF PROCEDURE:  The patient was taken to the operating room,  placed in the supine position at which time the right upper extremity was  prepped with Betadine scrub and solution, draped in routine sterile manner.  After infiltration of 1% Xylocaine, the location of the A-V fistula was  marked after using the Doppler.  There was continuous flow over the brachial  artery and vein just proximal to the antecubital area.  A short longitudinal  incision was made after infiltrating with Xylocaine, carried down to the  fascia, and the brachia artery was exposed.  The area of the fistula was  identified, and the brachial vein was ligated proximal and distal to the  fistula.  The brachial artery was encircled with vessel loops proximal and  distal to the communication.  The artery was then temporarily occluded with  vessel loops, and the connection which was about 3 to 4 mm in size was  divided.  There was a fistulous tract entering the brachial artery at this  point.  This was then oversewn with 6-0 Prolene to preserve good flow down  the  brachial artery.  This was checked with the Doppler at both the radial  ulnar and distal brachial level, and the fistulous flow was no longer  present.  Adequate hemostasis was then achieved.  The wound was closed with  Vicryl in a subcuticular fashion. Steri-Strips and sterile dressing applied.  Patient taken to the recovery room in satisfactory condition.                                                Quita Skye Hart Rochester, M.D.    JDL/MEDQ  D:  12/08/2001  T:  12/08/2001  Job:  [id number]

## 2010-07-10 NOTE — H&P (Signed)
[place]. [facility]  Patient:    Dustin [name], Dustin [name]                          MRN: [id number] Adm. Date:  [id number] Disc. Date: [id number] Attending:  Loreli Dollar Dictator:   Darcella Gasman. [place], F.N.P.C.                         History and Physical  CHIEF COMPLAINT:  Chest discomfort.  HISTORY OF PRESENT ILLNESS:  [age]-year-old patient of Dr. Suzanne Boron was seen in his office, May 13, 2000, on routine followup for his coronary disease.  His history included bypass surgery in 1995 and patient had done well except for the four, five to six weeks before the office visit of May 13, 2000, he began having episodes of chest discomfort.  He stated he could walk outside into the cool air and with minimal activity and have a pressure in his chest which was new.  It occasionally would radiate into this arms.  It goes away if he stops his activity or if he goes inside away from the cold.  He did have one episode which awoke him from sleep, described as a fullness in his chest.  He sat on the side of the bed, burped and the discomfort resolved. He tried no nitroglycerin for any of his complaints, was unaware of any arrhythmias, though he feels his pulse gets slow at night and more forceful. No dizziness, syncope.  No peripheral swelling.  He is being brought into the hospital, ______ , for outpatient cath.  PAST MEDICAL HISTORY:  Coronary disease with bypass grafting, as described. History of leukemia in 1997, treated at [facility] and is in remission.  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  Past history of CVA and GI bleed and post abdominal aortic aneurysm repair.  FAMILY HISTORY:  Not significant for this admission.  SOCIAL HISTORY:  He is married.  He does occasionally have a glass of wine. No tobacco.  REVIEW OF SYSTEMS:  Essentially negative except for chest pain as described.  OUTPATIENT MEDICATIONS: 1. Zestril 20 mg daily. 2.  Lasix 20 mg daily. 3. Actos 30 mg daily. 4. Nitroglycerin p.r.n. 5. Claritin-D p.r.n. 6. Vitamin E daily. 7. Zoloft 100 mg daily. 8. Tylenol b.i.d. 9. Prilosec 20 mg daily.  ALLERGIES:  PROPRANOLOL.  PHYSICAL EXAMINATION:  VITAL SIGNS:  Blood pressure 168/68, pulse 59, respirations 18, temperature 97.3.  Height 5 feet 9 inches.  Weight 173 pounds, estimated.  GENERAL:  Alert, oriented white male in no acute distress.  LUNGS:  Clear.  HEART:  Regular rate and rhythm without ectopic beats.  No murmurs noted.  NECK:  Supple.  No JVD.  No bruits.  SKIN:  Well-healed anterior chest scar and abdominal scar.  ABDOMEN:  Soft, nontender.  Positive bowel sounds.  No masses.  EXTREMITIES:  Good pulses in the upper and lower extremities.  ASSESSMENT: 1. Unstable angina. 2. Coronary artery disease, status post coronary artery bypass graft. 3. History of leukemia. 4. Status post abdominal aortic aneurysm repair. 5. Hyperlipidemia. 6. Adult-onset diabetes mellitus.  PLAN:  Outpatient cardiac catheterization.  Continue current medications. Follow up in the hospital. DD:  06/23/00 TD:  06/25/00 Job: [id number]

## 2010-07-10 NOTE — Discharge Summary (Signed)
[place]. [facility]  Patient:    Dustin [name], Dustin [name] Visit Number: [id number] MRN: [id number]          Service Type: MED Location: [phone] Attending Physician:  Kimber Relic Dictated by:   Lorin Picket. Claiborne Billings, N.P. Admit Date:  05/02/2001 Discharge Date: 05/06/2001   CC:         Lenon Curt. Cassell Clement, M.D.  Dewayne Shorter, M.D.   Discharge Summary  DATE OF BIRTH:  December 08, 1927  CHIEF COMPLAINT:  Chest pain.  HISTORY OF PRESENT ILLNESS:  This 77 year old male patient followed by Dr. Murray Hodgkins at [facility].  The patient complained of chest pain approximately two days prior to this admission.  He experienced some shortness of breath.  He states his pain intensifies with walking and worsens with deep inspiration.  He is admitted for further workup of chest pain and shortness of breath.  ALLERGIES:  CODEINE and NSAIDs.  SOCIAL HISTORY:  The patient is married and lives with his wife.  No tobacco or alcohol use.  He has five children.  PAST MEDICAL HISTORY:  1. CVA in 1988 with right sided weakness.  2. MI in 1993.  3. CABG times four vessel in 1995.  4. Abdominal aortic aneurysm repair in 1994.  5. Acute promyelocytic leukemia diagnosed in 1997 in remission since 1999.  6. Congestive heart failure.  7. Hypertension.  8. Diabetes.  9. Degenerative joint disease. 10. Depression. 11. Diverticulitis. 12. Hyperlipidemia. 13. Parkinsons disease.  REVIEW OF SYSTEMS:  GENERAL:  Chills and question fever.  HEENT:  Unremarkable.  CARDIOVASCULAR:  Chest pain with no palpitations.  RESPIRATIONS: Cough in a.m. with sputum production yellow brown in color. Shortness of breath and dyspnea on exertion noted.  GI:  Positive bloating and positive constipation.  Last bowel movement May 01, 2001.  Has some nausea but no vomiting.  GENITOURINARY:  No difficulties.  MUSCULOSKELETAL:  Generalized weakness.  NEUROLOGIC:   Unsteady balance.  PHYSICAL EXAMINATION:  VITAL SIGNS:  On admission temperature not listed but noted feels warm to touch.  Pulse 67, respirations 22, blood pressure 132/80.  HEENT:  Unremarkable.  NECK:  No bruits noted.  CHEST:  Bibasilar rales noted.  Increased discomfort with inspiration.  CARDIOVASCULAR:  Heart sounds regular without murmur or gallops.  GI:  Distended but soft with positive bowel sounds.  Some discomfort with palpation.  EXTREMITIES: No clubbing, cyanosis, or edema.  NEUROLOGIC:  Positive tremors and unsteady gait.  The patient oriented times three.  HOSPITAL COURSE:  The patient was admitted for further evaluation of complaints of chest pain.  Rule out MI.  Also noted to have abdominal pain with history of diverticulitis.  The patient was admitted to telemetry bed. Admission labs showed blood gas on May 02, 2001 on room pH high 7.463, pCO2 37.6, pO2 low 70.9, bicarbonate high 27.2, total CO2 27.7.  CBC found WBC 7.0, hemoglobin low at 12.9, hematocrit low 38.3, platelets 182 and all other values normal.  Metabolic panel:  Sodium 135, potassium 5.2 and hemolyzed, chloride 97, CO2 27, BUN 13, and creatinine 1.0.  Liver indices were unremarkable.  Cardiac enzymes obtained, CK high 231, MB 3.9, relative index 1.7, troponin 0.03.  Follow enzymes, CK 155, MB 2.4, relative index 1.5, troponin 0.03.  Final enzymes CK 135, MB 2.6, relative index 1.9, troponin 0.02.  Urinalysis did not appear remarkable for UTI.  Blood cultures obtained at the time of admission ultimately produced  no growth.  Sputum culture obtained felt not represented of lower respiratory secretions.  Urine culture showed no growth.  Follow up sputum culture was obtained and this was noted to have rare gram positive cocci in pairs, reincubated for better growth.  Sputum for acid fast bacilli was negative.  Chest x-ray initially noted interval development of focal air space disease right  upper lobe probably representing pneumonia.  An ultrasound of the abdomen was done to evaluate abdominal pain.  This noted no gallstones or acute gallbladder abnormality.  There was no hydronephrosis.  CT of the chest was done to further evaluate on May 03, 2001.  This noted right upper lobe pneumonia with some volume loss.  No evidence of pulmonary emboli.  No evidence of mediastinal or hilar mass or adenopathy.  Again, the patient admitted to telemetry bed.  MI ruled out per cardiac enzymes.  X-ray and CT scan suggested a right upper lobe pneumonia explaining the patients chest discomfort and shortness of breath particularly with exertion.  Initial chest x-ray suggested possibility of TB but this was ruled out by follow CT scan.  The patient initially placed on Levaquin and Flagyl combination antibiotic therapy.  This was later changed to Tequin.  Some hypoglycemia on admission, noted history of diabetes.  Glucophage was held at that point and the patient was covered with sliding scale insulin.  Infectious disease consult obtained due to initial possibility of tuberculosis with Dr. Elesa Massed.  The patients symptomatology did improve nicely on Tequin antibiotic therapy.  As oral intake improved, Metformin was restarted for blood sugar control.  Per final infectious disease note by Dr. Orvan Falconer noting resolving right upper lobe pneumonia, most likely due to common bacterial passage and no evidence of tuberculosis.  Felt it was okay to stop isolation and recommended discharge on 14 days of Tequin total therapy.  The patient was discharged May 06, 2001 in stable condition to home.  Follow up with Dr. Murray Hodgkins requested post discharge.  DISCHARGE MEDICATIONS:  1. Zoloft 100 mg daily.  2. Prinivil 40 mg once daily.  3. Lasix 20 mg once daily.  4. Lipitor 10 mg once daily.  5. Enteric coated aspirin 325 mg daily.   6. Norvasc 5 mg daily.  7. Protonix 400 mg once daily.  8. Tequin 400  mg once daily for an additional ten days post discharge.  9. Glucophage 500 mg twice daily. 10. Restoril 15 mg daily at bedtime as needed.  DISCHARGE DIAGNOSES: 1. Right upper lobe pneumonia improving on antibiotic therapy. 2. Diabetes, stable. 3. Abdominal pain resolved with no obvious abnormality per ultrasound. 4. Mild anemia, to follow post discharge.  DISCHARGE LABORATORY DATA:  Follow up CBC on May 05, 2001 found WBC 5.6, hemoglobin low at 12.3, hematocrit low at 26.0, platelets 197.  Differential was essentially unremarkable.  Stool for occult blood was negative.  Follow up metabolic panel on May 04, 2001 showed sodium 141, potassium 4.3, chloride 102, CO2 31, glucose 88, BUN 22 and creatinine 1.3.  Follow up chest x-ray on May 05, 2001 noted slight improvement.  CONSULTS OVER THE COURSE OF HOSPITALIZATION:  Dr. Orvan Falconer, infectious disease.  PROCEDURES OVER THE COURSE OF HOSPITALIZATION: 1. CT scan of the chest on May 03, 2001 results as above. 2. Ultrasound of the abdomen, May 02, 2001 results as above.  SPECIAL DISCHARGE INSTRUCTIONS: 1. Discharge diet is ADA for diabetes. 2. Follow up with Dr. Chilton Si in approximately two weeks post discharge. 3. Follow up chest x-ray  post discharge as indicated.  DISPOSITION:  The patient returning home where he lives with his wife independently.  DISCHARGE PROCESS:  Less than 30 minutes.Dictated by:   Lorin Picket. Claiborne Billings, N.P. Attending Physician:  Kimber Relic DD:  05/24/01 TD:  05/25/01 Job: [id number]

## 2010-07-10 NOTE — Discharge Summary (Signed)
Dustin [name], Dustin [name]:  [id number]   MEDICAL RECORD NO.:  [id number]          PATIENT TYPE:  INP   LOCATION:  3708                         FACILITY:  [facility]   PHYSICIAN:  Cristy Hilts. Jacinto Halim, MD       DATE OF BIRTH:  12/08/1927   DATE OF ADMISSION:  09/05/2007  DATE OF DISCHARGE:  09/10/2007                               DISCHARGE SUMMARY   DISCHARGE DIAGNOSES:  1. Non-ST elevation myocardial infarction.  2. Respiratory arrest secondary to Versed, reversed first without      complications.  3. Coronary disease with percutaneous transluminal coronary      angioplasty and stent to the proximal left circumflex and mid left      anterior descending followed by acute thrombus of the proximal left      circumflex with repeat cath and then for stent placed.  4. History of coronary disease with prior bypass grafting and stent.  5. Peripheral arterial disease.  6. Hypertension.  7. Hyperlipidemia.  8. Diabetes mellitus.  9. Mildly depressed left ventricle with an ejection fraction of 45%.  10.Mild anemia.  11.Rectal hemorrhoids, polyps with bright blood.   DISCHARGE CONDITION:  Improved.   PROCEDURES:  On September 05, 2007, elective PTCA and stent deployment to the  LAD and to the circumflex with residual 99% stenosis to the saphenous  vein graft to the RCA.   After chest pain and normal enzymes, the patient was recathed on September 06, 2007, again he had the 95% stenosis to the saphenous vein graft to  the RCA and now 95% stenosis of the proximal left circumflex undergoing  PTA and stent to that area proximal to the stent placed on September 05, 2007, and then also distal to that stent.   Taxus stent was placed.   DISCHARGE MEDICATIONS:  1. Enteric-coated aspirin 325 mg daily.  2. Plavix 75 mg daily.  3. Crestor 10 mg daily.  4. Ranexa 500 mg twice a day.  5. Folic acid 1 mg daily.  6. HCTZ 25 mg daily.  7. Metformin 1000 mg twice a day.  8. Prilosec 20 mg twice a  day.  9. Zoloft 50 mg daily.  10.Spironolactone 25 mg daily.  11.Zestril 40 mg daily.  12.Lopressor 25 mg half a tablet a twice.  13.Vitamin E 400 units daily.  14.Nitroglycerin sublingual as needed for chest pain.  15.Stool softener 1 or 2 times a day.  16.Stop fluvastatin and amlodipine.   DISCHARGE INSTRUCTIONS:  1. Low-sodium and heart-healthy diabetic diet.  2. Wash cath site with soap and water.  Call us if any bleeding,      swelling, or drainage.  3. Increase activity slowly.  May shower bath. No lifting for 2 weeks.      No driving for 2 weeks.  4. Have blood work done on Tuesday.  5. Followup with Dr. Jacinto Halim in office, will call with date and time.   HISTORY OF PRESENT ILLNESS:  Dustin [name] is a 77 year old white married  male was referred to Dr. Jacinto Halim by Dr. [name] for intervention  of his  coronary disease and plans for possible back surgery in the future.  He  had significant spondylolisthesis and has had steroid injections.  The  patient was also having exertional chest pain for 3 months.  Dr.  [name] ordered a stress test, which revealed inferior lateral severe  ischemia superimposed on the scar.  Because of his peripheral arterial  disease, he is to have access site complications in the past.  The  patient was referred to Dr. Jacinto Halim for angiography and possible  angioplasty.  The patient had chest heaviness middle of his chest for  the past one month, relieved with sublingual nitro, but because of  severe headaches with the nitro he is trying to tolerate the chest  discomfort without the nitro.  He has shortness of breath with the chest  discomfort and dyspnea on exertion.   ALLERGIES:  Allergic to CODEINE.   PAST MEDICAL HISTORY:  Coronary disease bypass grafting in 1995, he also  has hypertension, diabetes, diet-controlled hyperlipidemia.  He has had  AAA repair and aortobifemoral bypass surgery 1994.  He also has  promyelocytic leukemia, this has been in  remission since 2002.   He has a carotid bruit that is asymptomatic as well.  Nexium was added  to his medical regimen as an outpatient, and the patient was brought in  electively on September 05, 2007, for procedures as previously stated the  patient underwent procedure.  He was having discomfort in the holding  area after the procedure, was given Versed, he had respiratory arrest,  but after to 2.5 mcg of Romazicon he immediately woke up without further  problems.  He did have brief CPR unsure if he total lost his pulse or  not.  He was transferred to the CCU due to this difficulty after his  procedure.  By September 06, 2007, his enzymes were positive.  The patient  complained of a bloating feeling in his chest.  Enzymes on the second  set the morning of the September 06, 2007, had declined.  He underwent repeat  cardiac catheterization on the September 06, 2007, and had new blockage to  the circumflex, requiring two more stents as previously stated.  Postprocedure, the patient again developed 10/10 chest pain acutely.  His IV nitroglycerin was restarted.  He had EKG with deeper T-waves in  the V4-V5 and some PACs.  We gave him 80 of the IV Lasix, pain eased  with IV nitroglycerin down to 1/10, still increased chest pain with deep  breaths.  Chest x-ray with no pneumothorax, he was given IV morphine and  for his nausea he was given Zofran.  The pain was more pleuritic and was  different than his chest pressure previously and the patient had  continued on his Integrilin as well.   The patient continued to improve after that point in time.  By the September 07, 2007, he had no chest pain, felt well.  He was ambulated with  cardiac rehab and did fairly well.  He did have some weakness but no  chest pain.  Again continued to improve.  O2 was weaned.  He continued  to ambulate with stronger each time and by the September 08, 2007, he then  transferred to telemetry, continued to ambulate and by this time on his  own  without complications.  By the morning of September 10, 2007 he was  stable and ready to go home.  Dr. Lynnea Ferrier saw him and agreed for  discharge.  PHYSICAL EXAMINATION:  VITAL SIGNS:  Blood pressure was 114/60, pulse  70, respiratory rate 18, temperature 98.2, oxygen saturation 95% on room  air.  HEART:  S1and S2 regular, rate, and rhythm.  LUNGS:  Clear to auscultation.  ABDOMEN:  Soft, nontender.  Positive bowel sounds.  EXTREMITIES:  No edema.  NEUROLOGIC:  Alert and oriented x3.   LABORATORY DATA:  Hemoglobin on admission 11.8, hematocrit 35.5, WBC 11,  platelets 471, they did drop platelets to 120 after the Integrilin and  hemoglobin dropped to a low of  9.9, and hematocrit 29.8.  But at  discharge, hemoglobin 10.5, hematocrit 31, and platelets 174.   PTT 28.   Chemistry; sodium 140, potassium 4.4, chloride 107, CO2 of 27, glucose  132, BUN 17, creatinine 0.93 and these remained stable.  Peak creatinine  was 1.99.  But at discharge creatinine 1.18, BUN 26, glucose 126, CO2 of  27, chloride 101, potassium 4.7 and sodium 140.   Cardiac enzymes; post procedure initially CK 132 with an MB of 10 and  troponin 0.27, then CK bumped to 478 with an MB of 86.7 and troponin of  2.79.  Peak CK-MB on the 15th at 6 p.m. was 1596 with an MB of 256 and  troponin I of 81.53.  After that point, enzymes preceded downward and by  the September 07, 2007, CK 803, MB 99 and troponin I of 57.31.  Chest x-ray;  on the 15th no acute findings.  No pneumo, lungs were clear.  EKGs sinus  rhythm with deep T-wave.  On September 07, 2007, on his lateral leads that  continued until discharge.  A 2-D echo September 07, 2007, EF was 45%  suggestive of mild hypokinesis of the posterior lateral wall, LV wall  thickness was mildly increased.   The patient was discharged without further complaints and was to follow  up as an outpatient.      Darcella Gasman. [place], N.P.      Cristy Hilts. Jacinto Halim, MD  Electronically Signed     LRI/MEDQ  D:  10/21/2007  T:  10/22/2007  Job:  [id number]   cc:   Cristy Hilts. Jacinto Halim, MD

## 2010-09-03 ENCOUNTER — Encounter: Payer: 59 | Admitting: Internal Medicine

## 2010-09-03 ENCOUNTER — Encounter: Payer: Self-pay | Admitting: Internal Medicine

## 2010-09-03 ENCOUNTER — Ambulatory Visit (INDEPENDENT_AMBULATORY_CARE_PROVIDER_SITE_OTHER): Payer: 59 | Admitting: Internal Medicine

## 2010-09-03 DIAGNOSIS — Z95810 Presence of automatic (implantable) cardiac defibrillator: Secondary | ICD-10-CM

## 2010-09-03 DIAGNOSIS — I4891 Unspecified atrial fibrillation: Secondary | ICD-10-CM

## 2010-09-03 DIAGNOSIS — I428 Other cardiomyopathies: Secondary | ICD-10-CM

## 2010-09-03 DIAGNOSIS — I5022 Chronic systolic (congestive) heart failure: Secondary | ICD-10-CM

## 2010-09-03 LAB — ICD DEVICE OBSERVATION
AL AMPLITUDE: 1.8 mv
CHARGE TIME: 9.499 s
FVT: 0
RV LEAD AMPLITUDE: 10.3 mv
RV LEAD IMPEDENCE ICD: 437 Ohm
TOT-0001: 1
TOT-0002: 0
TZAT-0001ATACH: 3
TZAT-0001FASTVT: 1
TZAT-0002ATACH: NEGATIVE
TZAT-0002FASTVT: NEGATIVE
TZAT-0012ATACH: 150 ms
TZAT-0012ATACH: 150 ms
TZAT-0012FASTVT: 200 ms
TZAT-0012SLOWVT: 200 ms
TZAT-0012SLOWVT: 200 ms
TZAT-0018ATACH: NEGATIVE
TZAT-0018SLOWVT: NEGATIVE
TZAT-0019ATACH: 6 V
TZAT-0019ATACH: 6 V
TZAT-0019SLOWVT: 8 V
TZAT-0019SLOWVT: 8 V
TZAT-0020ATACH: 1.5 ms
TZAT-0020ATACH: 1.5 ms
TZAT-0020SLOWVT: 1.5 ms
TZAT-0020SLOWVT: 1.5 ms
TZON-0003VSLOWVT: 430 ms
TZST-0001ATACH: 5
TZST-0001ATACH: 6
TZST-0001FASTVT: 4
TZST-0001FASTVT: 5
TZST-0001SLOWVT: 5
TZST-0002ATACH: NEGATIVE
TZST-0002FASTVT: NEGATIVE
TZST-0002FASTVT: NEGATIVE
TZST-0003SLOWVT: 15 J
TZST-0003SLOWVT: 35 J

## 2010-09-03 NOTE — Assessment & Plan Note (Signed)
His symptoms vary between class II and class III. I've encouraged him to maintain a low-sodium diet. He'll continue his current medical therapy.

## 2010-09-03 NOTE — Progress Notes (Signed)
HPI Mr. Eisenhardt returns today for followup. He is a pleasant 77 year old man with an ischemic cardiomyopathy, chronic systolic heart failure, COPD, status post ICD implantation. He has been in hospice. Despite this he continues to remain stable. His heart failure is class III. He has not been hospitalized in the last few months. He denies any recent ICD shocks. No syncope. Allergies  Allergen Reactions  . Codeine     REACTION: "makes me crazy"  . Sulfamethoxazole W/Trimethoprim      Current Outpatient Prescriptions  Medication Sig Dispense Refill  . allopurinol (ZYLOPRIM) 300 MG tablet Take 300 mg by mouth daily.        Marland Kitchen ALPRAZolam (XANAX) 0.25 MG tablet Take 0.25 mg by mouth at bedtime as needed.        Marland Kitchen aspirin 81 MG tablet Take 81 mg by mouth daily.        . benzonatate (TESSALON) 200 MG capsule Take 200 mg by mouth 3 (three) times daily as needed.        . cetirizine (ZYRTEC) 10 MG chewable tablet Chew 10 mg by mouth daily.        . Cholecalciferol (VITAMIN D-3 PO) Take by mouth. daily       . furosemide (LASIX) 40 MG tablet Take 40 mg by mouth 2 (two) times daily.        Marland Kitchen glipiZIDE (GLUCOTROL) 5 MG tablet Take 5 mg by mouth 2 (two) times daily before a meal.       . guaiFENesin (MUCINEX) 600 MG 12 hr tablet Take 1,200 mg by mouth as needed.        . iron polysaccharides (NIFEREX) 150 MG capsule Take 150 mg by mouth 2 (two) times daily.        . isosorbide-hydrALAZINE (BIDIL) 20-37.5 MG per tablet Take 1 tablet by mouth 3 (three) times daily.        Marland Kitchen levalbuterol (XOPENEX HFA) 45 MCG/ACT inhaler Inhale 1-2 puffs into the lungs every 4 (four) hours as needed.        . metolazone (ZAROXOLYN) 5 MG tablet Take 5 mg by mouth as needed.        . Multiple Vitamin (MULTIVITAMIN) capsule Take 1 capsule by mouth daily.        . nebivolol (BYSTOLIC) 5 MG tablet Take 5 mg by mouth daily. Take 1/2 (half) tab qd      . nitroGLYCERIN (NITROSTAT) 0.4 MG SL tablet Place 0.4 mg under the tongue every  5 (five) minutes as needed.        . NON FORMULARY OXYGEN  As directed       . omeprazole (PRILOSEC) 20 MG capsule Take 20 mg by mouth daily.        . polyethylene glycol powder (MIRALAX) powder Take 17 g by mouth daily.        . pravastatin (PRAVACHOL) 40 MG tablet Take 40 mg by mouth daily.        . sertraline (ZOLOFT) 100 MG tablet Take 100 mg by mouth daily.          Past Medical History  Diagnosis Date  . IHD (ischemic heart disease)      -  non-ST elevation MI in July 2009. and NSTEMI Jan 2010 in setting of RLL CAP. 99% diagonal  disease s/p stent July of 2009. Cath Jan 2010 showed  occulusion of SVG to RCA   LVEF 45%. rec med rx   - Echo 10/29/08 EF 25%  .  PAD (peripheral artery disease)   . Hyperlipidemia   . Hypertension   . Obstructive sleep apnea      Refuses to wear his CPAP.  Not on oxygen   . Vocal cord dysfunction     with Botox injection at [facility] around 2005  per patient.     - Patient's swallowing difficulties may be related to vocal    cord dysfunction, recommended to be followed up at [facility]  . Stroke     History of left brain cerebrovascular accident in 1998 without   . Dysphagia       -swallowing study nl oropharyngeal swallow, brief pause  at top of the esophagus which clears with liquid wash or small bites.    Rec  regular diet with thin liquids and continue management of    esophageal dysmotility.   - Barium swallow 10/31/08  mild dilation upper esosphagus, moderate slowing, can't r/o mass  . Anemia         03/21/2008 hgb 10.9, MCV 86, Stool occult blood postiive -> On IV Iron   . COPD (chronic obstructive pulmonary disease)     - Hypoxemia, former smoker-  FEV1 1.86 (72%) , ratio 68% (07/26/08) - FEV1 1.61 (62%) , ratio 74    ( 10/29/08) with no insp or exp truncation on f/v loop    ROS:   All systems reviewed and negative except as noted in the HPI.   Past Surgical History  Procedure Date  . Coronary artery bypass graft   .  Abdominal aortic aneurysm repair   . Aortobifemoral bypass graft in the past   . Icd insertion     ICD Medtronic     Family History  Problem Relation Age of Onset  . COPD Sister   . Heart disease Sister   . Alzheimer's disease Sister   . Lung cancer Sister   . Heart disease Brother   . Alzheimer's disease Brother      History   Social History  . Marital Status: Married    Spouse Name: N/A    Number of Children: 5  . Years of Education: N/A   Occupational History  . Not on file.   Social History Main Topics  . Smoking status: Former Smoker -- 0.5 packs/day for 15 years    Quit date: 02/23/1980  . Smokeless tobacco: Not on file  . Alcohol Use: Yes  . Drug Use:   . Sexually Active:    Other Topics Concern  . Not on file   Social History Narrative   Quit smoking in 1982, smoked 1/2 pack a day for 15 years.MarriedRetiredPatient has nine siblings He worked at [employer] and was exposed to  whiskey fumes for 8 years.  He is married.  Smoked for 20 years 3-4  cigarettes a day, but quit 30 years ago.  Does not drink other than  socially.  He is retired, has 5 children and other grandchildren.      BP 146/66  Pulse 67  Resp 18  Ht 5\' 9"  (1.753 m)  Wt 162 lb (73.483 kg)  BMI 23.92 kg/m2  Physical Exam:  Elderly appearing NAD HEENT: Unremarkable Neck:  No JVD, no thyromegally Lymphatics:  No adenopathy Back:  No CVA tenderness Lungs:  Clear. Well-healed ICD incision HEART:  Regular rate rhythm, no murmurs, no rubs, no clicks Abd:  soft, positive bowel sounds, no organomegally, no rebound, no guarding Ext:  2 plus  pulses, no edema, no cyanosis, no clubbing Skin:  No rashes no nodules Neuro:  CN II through XII intact, motor grossly intact  EKG Normal sinus rhythm with AV sequential pacing DEVICE  Normal device function.  See PaceArt for details.  Assessment/Plan:

## 2010-09-03 NOTE — Assessment & Plan Note (Signed)
His device is working normally. Plan to recheck in several months. 

## 2010-09-03 NOTE — Patient Instructions (Signed)
Your physician wants you to follow-up in: 12 months with Dr Taylor You will receive a reminder letter in the mail two months in advance. If you don't receive a letter, please call our office to schedule the follow-up appointment.  Remote monitoring is used to monitor your Pacemaker of ICD from home. This monitoring reduces the number of office visits required to check your device to one time per year. It allows us to keep an eye on the functioning of your device to ensure it is working properly. You are scheduled for a device check from home on 12/03/2010. You may send your transmission at any time that day. If you have a wireless device, the transmission will be sent automatically. After your physician reviews your transmission, you will receive a postcard with your next transmission date.   

## 2010-09-03 NOTE — Assessment & Plan Note (Signed)
He is maintaining sinus rhythm. We'll continue current medical therapy.

## 2010-10-13 ENCOUNTER — Encounter: Payer: Self-pay | Admitting: Internal Medicine

## 2010-11-19 LAB — HEMOGLOBIN AND HEMATOCRIT, BLOOD
HCT: 36.2 — ABNORMAL LOW
Hemoglobin: 12.3 — ABNORMAL LOW

## 2010-11-19 LAB — CARDIAC PANEL(CRET KIN+CKTOT+MB+TROPI)
CK, MB: 10.1 — ABNORMAL HIGH
Relative Index: 7.7 — ABNORMAL HIGH

## 2010-11-20 LAB — GLUCOSE, CAPILLARY
Glucose-Capillary: 119 — ABNORMAL HIGH
Glucose-Capillary: 134 — ABNORMAL HIGH
Glucose-Capillary: 164 — ABNORMAL HIGH
Glucose-Capillary: 95

## 2010-11-20 LAB — CARDIAC PANEL(CRET KIN+CKTOT+MB+TROPI)
CK, MB: 201.6 — ABNORMAL HIGH
CK, MB: 256.1 — ABNORMAL HIGH
CK, MB: 86.7 — ABNORMAL HIGH
CK, MB: 99 — ABNORMAL HIGH
CK, MB: 2.3
Relative Index: 14.9 — ABNORMAL HIGH
Relative Index: 15.9 — ABNORMAL HIGH
Relative Index: 16 — ABNORMAL HIGH
Relative Index: 18.1 — ABNORMAL HIGH
Total CK: 1267 — ABNORMAL HIGH
Total CK: 760 — ABNORMAL HIGH
Total CK: 38
Troponin I: 2.79
Troponin I: 6.82
Troponin I: 79.16

## 2010-11-20 LAB — CBC
HCT: 29.8 — ABNORMAL LOW
HCT: 31.6 — ABNORMAL LOW
HCT: 32.8 — ABNORMAL LOW
HCT: 33.9 — ABNORMAL LOW
HCT: 35 — ABNORMAL LOW
HCT: 35.5 — ABNORMAL LOW
HCT: 35.6 — ABNORMAL LOW
HCT: 36.4 — ABNORMAL LOW
HCT: 36.6 — ABNORMAL LOW
HCT: 37.4 — ABNORMAL LOW
Hemoglobin: 11.1 — ABNORMAL LOW
Hemoglobin: 9.9 — ABNORMAL LOW
Hemoglobin: 11.8 — ABNORMAL LOW
Hemoglobin: 11.9 — ABNORMAL LOW
Hemoglobin: 12 — ABNORMAL LOW
Hemoglobin: 12.1 — ABNORMAL LOW
MCHC: 33.1
MCHC: 34.4
MCHC: 32.9
MCHC: 33.1
MCHC: 33.2
MCHC: 33.4
MCHC: 33.8
MCHC: 34
MCV: 90.2
MCV: 90.5
MCV: 90.8
MCV: 90
MCV: 90.3
MCV: 90.6
MCV: 90.8
MCV: 90.9
MCV: 91.9
Platelets: 150
Platelets: 171
Platelets: 174
Platelets: 167
Platelets: 185
RBC: 3.76 — ABNORMAL LOW
RBC: 3.86 — ABNORMAL LOW
RBC: 3.89 — ABNORMAL LOW
RBC: 3.98 — ABNORMAL LOW
RBC: 4.01 — ABNORMAL LOW
RDW: 13.1
RDW: 13.1
RDW: 13.1
RDW: 13.7
RDW: 13.8
RDW: 13.8
RDW: 14.1
WBC: 11 — ABNORMAL HIGH
WBC: 5.9
WBC: 7.5
WBC: 5.7
WBC: 6.3

## 2010-11-20 LAB — BASIC METABOLIC PANEL
BUN: 14
BUN: 17
BUN: 26 — ABNORMAL HIGH
BUN: 34 — ABNORMAL HIGH
BUN: 14
BUN: 21
CO2: 28
CO2: 28
CO2: 29
Calcium: 8.6
Calcium: 9.2
Chloride: 100
Chloride: 100
Chloride: 107
Chloride: 106
Chloride: 106
Creatinine, Ser: 1.18
Creatinine, Ser: 1.23
Creatinine, Ser: 1.02
Creatinine, Ser: 1.06
Creatinine, Ser: 1.1
GFR calc Af Amer: >60
GFR calc non Af Amer: 33 — ABNORMAL LOW
GFR calc non Af Amer: 57 — ABNORMAL LOW
GFR calc non Af Amer: 60 — ABNORMAL LOW
GFR calc non Af Amer: >60
GFR calc non Af Amer: >60
Glucose, Bld: 125 — ABNORMAL HIGH
Glucose, Bld: 126 — ABNORMAL HIGH
Glucose, Bld: 132 — ABNORMAL HIGH
Glucose, Bld: 145 — ABNORMAL HIGH
Glucose, Bld: 102 — ABNORMAL HIGH
Glucose, Bld: 113 — ABNORMAL HIGH
Glucose, Bld: 124 — ABNORMAL HIGH
Potassium: 4.1
Potassium: 4.4
Potassium: 4.4
Potassium: 4.7
Potassium: 4.9
Sodium: 132 — ABNORMAL LOW
Sodium: 135
Sodium: 140
Sodium: 139

## 2010-11-20 LAB — DIFFERENTIAL
Eosinophils Relative: 2
Lymphocytes Relative: 13
Lymphs Abs: 0.8
Monocytes Absolute: 0.5
Neutro Abs: 4.7

## 2010-11-20 LAB — POCT I-STAT, CHEM 8
BUN: 28 — ABNORMAL HIGH
Chloride: 106
Creatinine, Ser: 1.1
Sodium: 138
TCO2: 27

## 2010-11-20 LAB — CK TOTAL AND CKMB (NOT AT ARMC)
CK, MB: 2.7
Relative Index: INVALID
Total CK: 40

## 2010-11-20 LAB — HEPARIN LEVEL (UNFRACTIONATED)
Heparin Unfractionated: 0.45
Heparin Unfractionated: 0.5
Heparin Unfractionated: <0.1 — ABNORMAL LOW

## 2010-11-20 LAB — TROPONIN I: Troponin I: 0.02

## 2010-11-20 LAB — COMPREHENSIVE METABOLIC PANEL
Albumin: 3.7
BUN: 25 — ABNORMAL HIGH
Calcium: 9.3
Creatinine, Ser: 1.06
Potassium: 5.2 — ABNORMAL HIGH
Total Protein: 6

## 2010-11-20 LAB — PROTIME-INR
INR: 1
Prothrombin Time: 13.5

## 2010-11-20 LAB — POCT CARDIAC MARKERS
CKMB, poc: 2.6
Troponin i, poc: <0.05

## 2010-11-20 LAB — APTT: aPTT: 28

## 2010-12-03 ENCOUNTER — Ambulatory Visit (INDEPENDENT_AMBULATORY_CARE_PROVIDER_SITE_OTHER): Payer: 59 | Admitting: *Deleted

## 2010-12-03 ENCOUNTER — Other Ambulatory Visit: Payer: Self-pay | Admitting: Internal Medicine

## 2010-12-03 ENCOUNTER — Encounter: Payer: Self-pay | Admitting: Internal Medicine

## 2010-12-03 DIAGNOSIS — Z95810 Presence of automatic (implantable) cardiac defibrillator: Secondary | ICD-10-CM

## 2010-12-03 DIAGNOSIS — I428 Other cardiomyopathies: Secondary | ICD-10-CM

## 2010-12-03 DIAGNOSIS — I5022 Chronic systolic (congestive) heart failure: Secondary | ICD-10-CM

## 2010-12-03 DIAGNOSIS — I4891 Unspecified atrial fibrillation: Secondary | ICD-10-CM

## 2010-12-05 LAB — REMOTE ICD DEVICE
AL AMPLITUDE: 1.6 mv
BAMS-0001: 170 {beats}/min
BRDY-0002LV: 60 {beats}/min
BRDY-0003LV: 130 {beats}/min
CHARGE TIME: 9.879 s
FVT: 0
LV LEAD THRESHOLD: 1 V
PACEART VT: 0
RV LEAD AMPLITUDE: 8 mv
RV LEAD IMPEDENCE ICD: 475 Ohm
TZAT-0001ATACH: 1
TZAT-0001FASTVT: 1
TZAT-0002ATACH: NEGATIVE
TZAT-0004SLOWVT: 8
TZAT-0011SLOWVT: 10 ms
TZAT-0011SLOWVT: 10 ms
TZAT-0012ATACH: 150 ms
TZAT-0012ATACH: 150 ms
TZAT-0012FASTVT: 200 ms
TZAT-0012SLOWVT: 200 ms
TZAT-0012SLOWVT: 200 ms
TZAT-0013SLOWVT: 2
TZAT-0019ATACH: 6 V
TZAT-0019SLOWVT: 8 V
TZAT-0020ATACH: 1.5 ms
TZAT-0020ATACH: 1.5 ms
TZAT-0020ATACH: 1.5 ms
TZAT-0020FASTVT: 1.5 ms
TZON-0003ATACH: 350 ms
TZON-0003SLOWVT: 340 ms
TZON-0004SLOWVT: 28
TZST-0001ATACH: 4
TZST-0001FASTVT: 2
TZST-0001FASTVT: 3
TZST-0001SLOWVT: 4
TZST-0001SLOWVT: 6
TZST-0002ATACH: NEGATIVE
TZST-0002FASTVT: NEGATIVE
TZST-0002FASTVT: NEGATIVE
TZST-0002FASTVT: NEGATIVE
TZST-0003SLOWVT: 15 J
TZST-0003SLOWVT: 35 J
VF: 0

## 2010-12-07 NOTE — Progress Notes (Signed)
icd remote check  

## 2010-12-22 ENCOUNTER — Encounter: Payer: Self-pay | Admitting: *Deleted

## 2011-01-21 IMAGING — RF DG ESOPHAGUS
11 of 16 series · 14 of 24 positions shown · non-contrast
Comparison: None

CLINICAL DATA: Intermittent difficulty swallowing.  Evaluation for
gastroesophageal reflux disease.

ESOPHOGRAM / BARIUM SWALLOW / BARIUM TABLET STUDY
TECHNIQUE: Combined double contrast and single contrast
examination performed using effervescent crystals, thick barium
liquid, and thin barium liquid.  The patient was observed with
fluoroscopy swallowing a 13mm barium sulphate tablet.
Fluoroscopy time:  3.5 minutes.

[Series 1: run · 2 of 17 slices shown (1 of 11)]
[im 1/17]
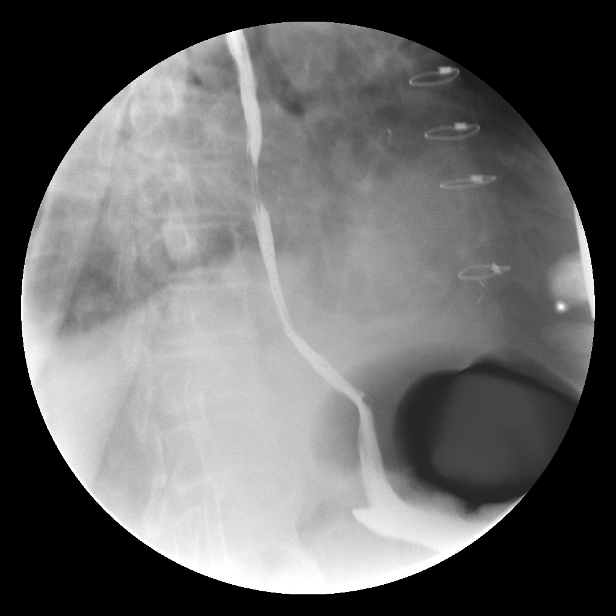
[im 17/17]
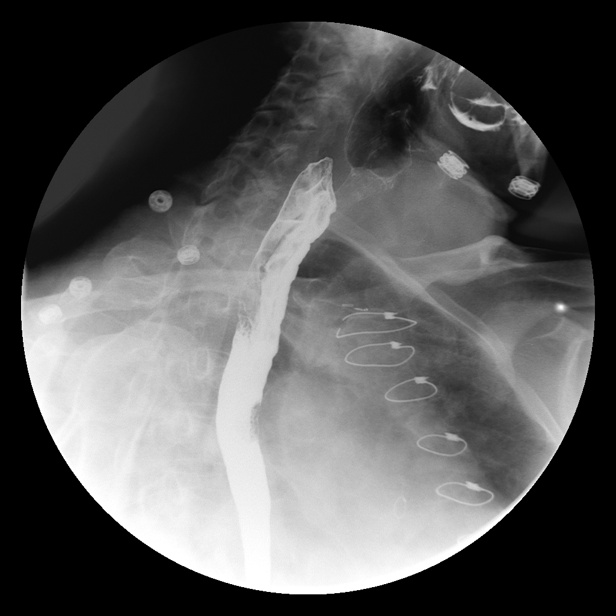

[Series 2: run · 1 of 12 slices shown (2 of 11)]
[im 12/12]
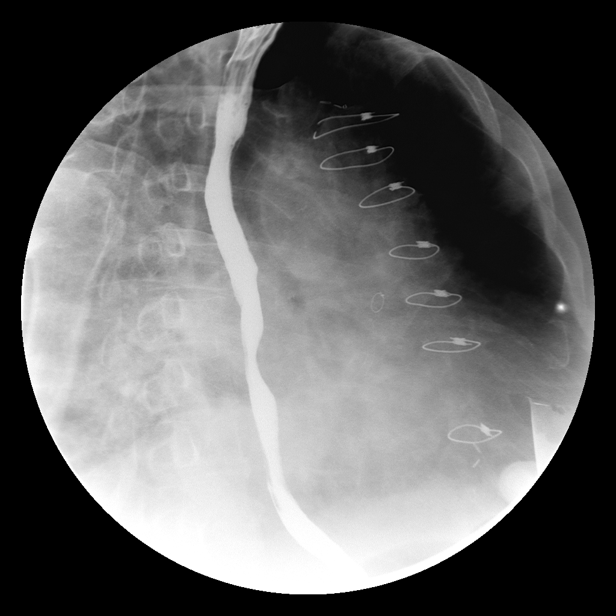

[Series 4: run · 2 of 11 slices shown (3 of 11)]
[im 1/11]
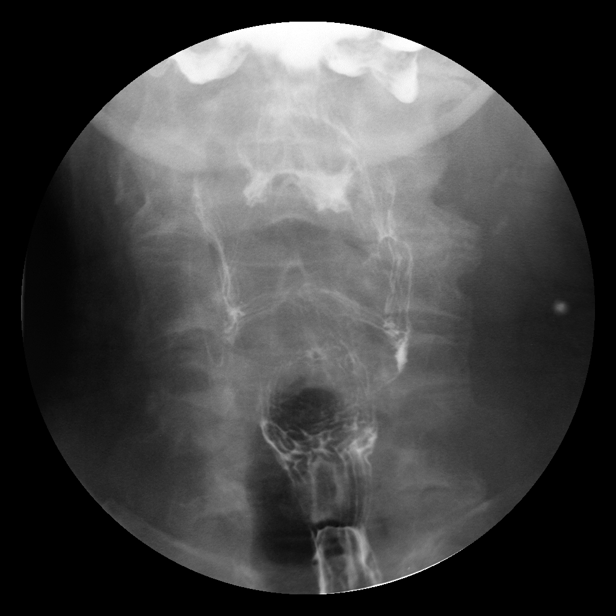
[im 11/11]
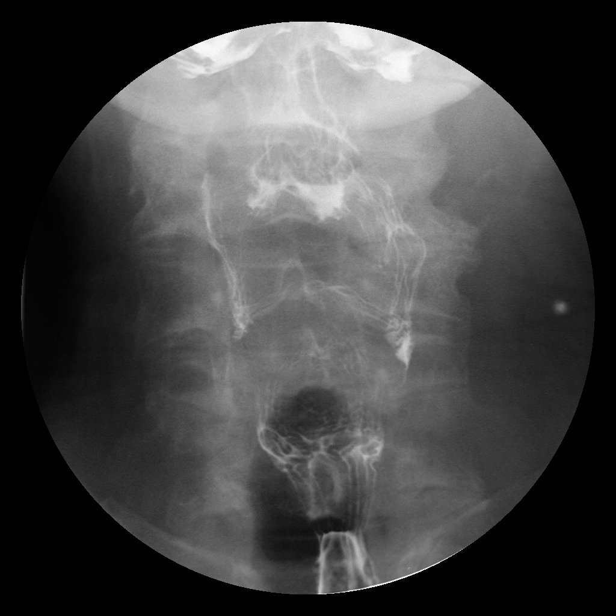

[Series 5: run · 1 of 11 slices shown (4 of 11)]
[im 11/11]
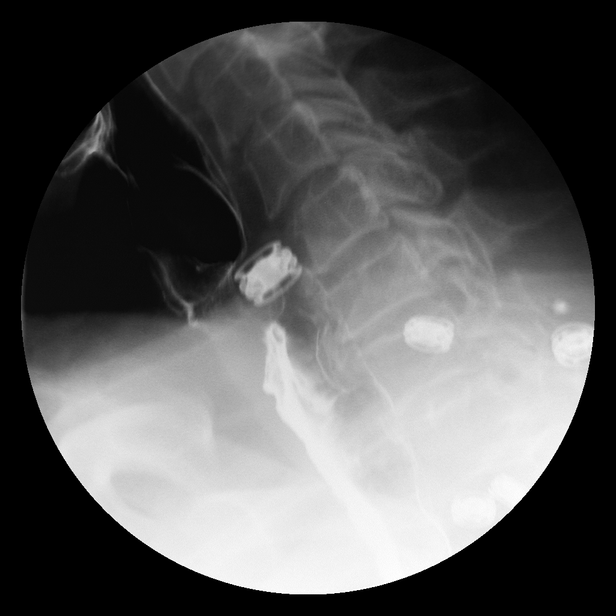

[Series 7: run · 1 of 9 slices shown (5 of 11)]
[im 1/9]
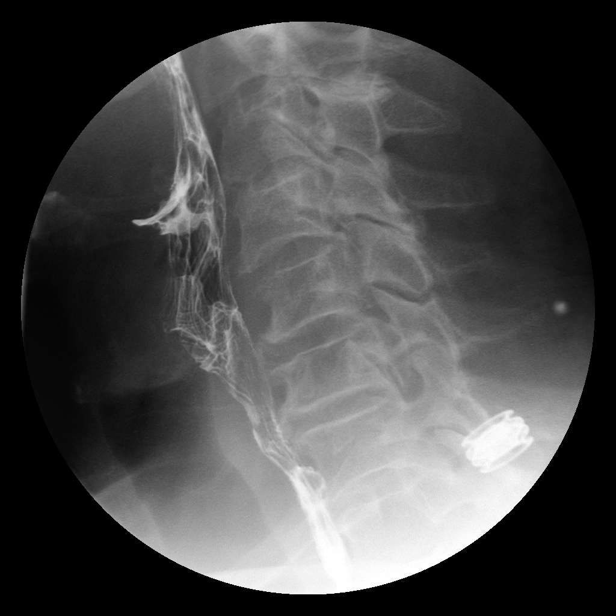

[Series 8: run · 1 of 14 slices shown (6 of 11)]
[im 1/14]
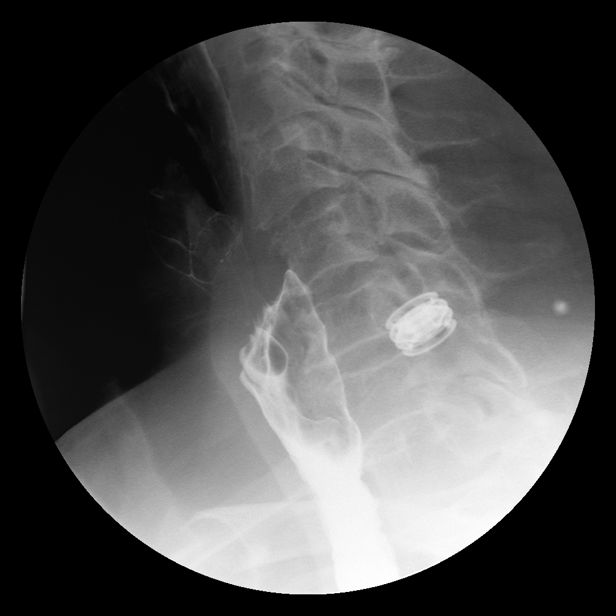

[Series 9: run · 1 of 1 slices shown (7 of 11)]
[im 1/1]
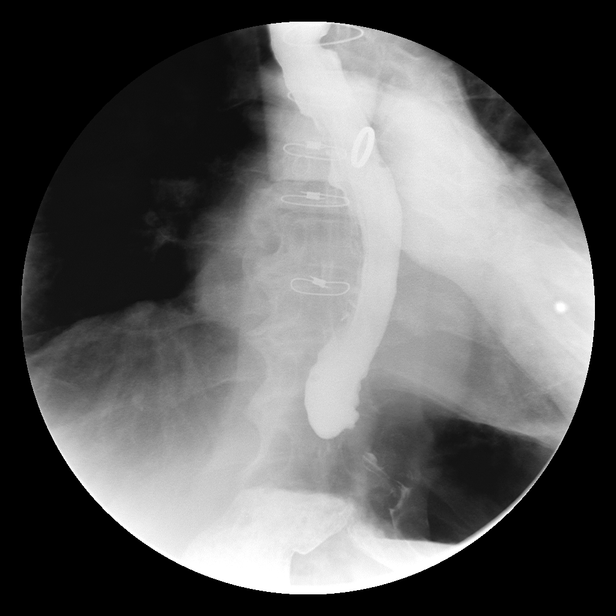

[Series 11: run · 1 of 1 slices shown (8 of 11)]
[im 1/1]
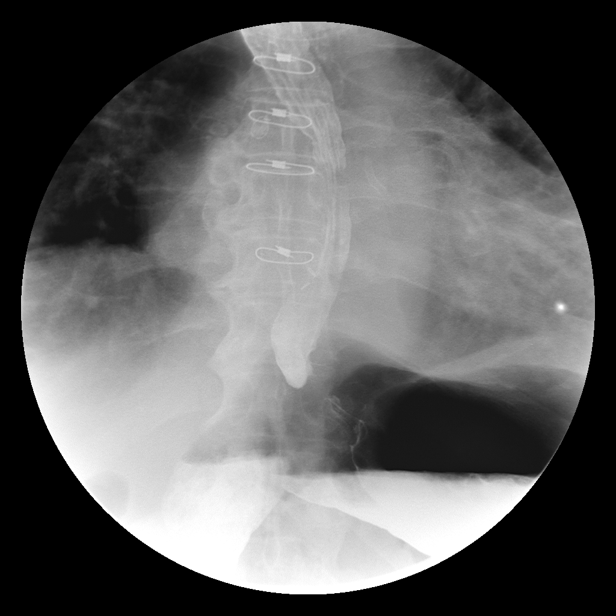

[Series 13: run · 2 of 19 slices shown (9 of 11)]
[im 1/19]
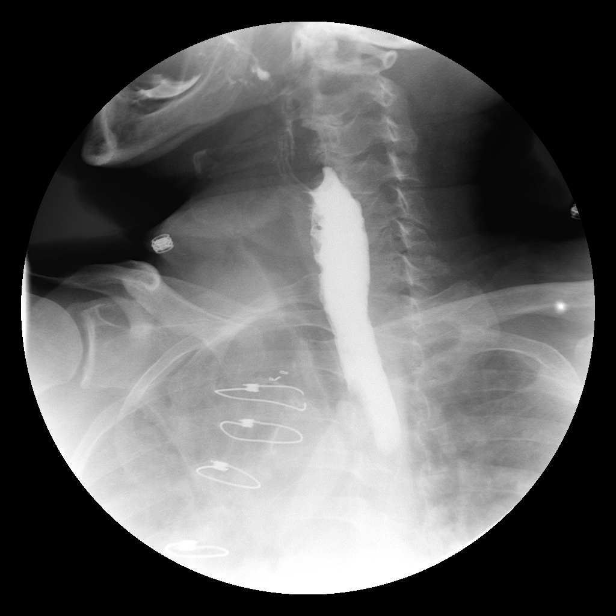
[im 10/19]
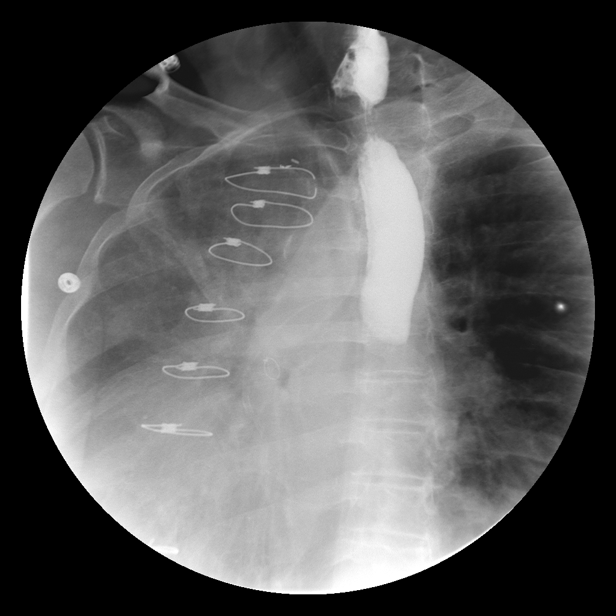

[Series 14: run · 1 of 1 slices shown (10 of 11)]
[im 1/1]
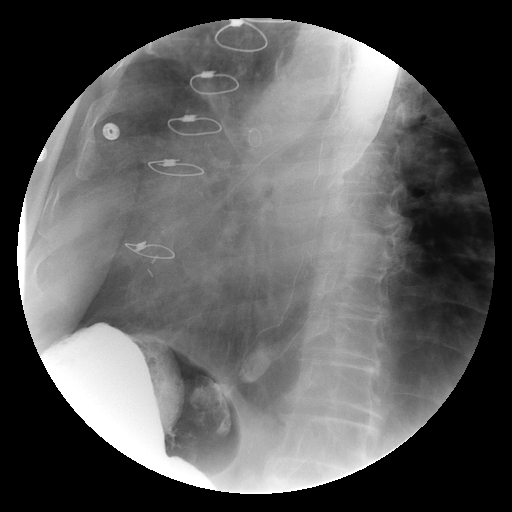

[Series 16: run · 1 of 1 slices shown (11 of 11)]
[im 1/1]
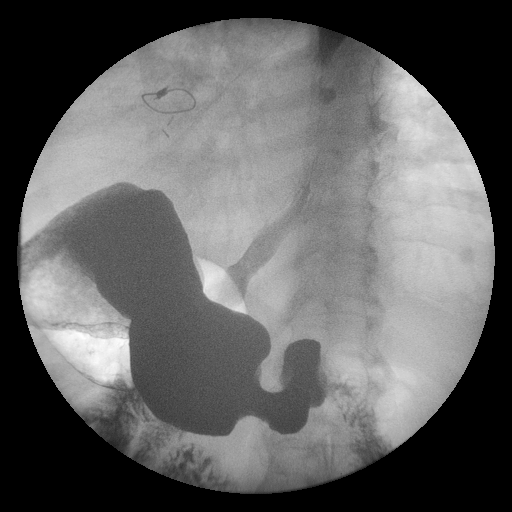

[14 of 24 positions shown; findings below may reference images not displayed]

FINDINGS: The patient had no difficulty initiating swallowing.  No
aspiration occurred.
Hypopharynx appears normal.  There is cervical degenerative
spondylosis with anterior marginal osteophyte formation.

At the level of C4-C5 there is posterior impression on the
posterior aspect of the cervical esophagus associated with the
degenerative spondylosis.

.  There is prominent contraction seen at the C5-C6 level with
slight possible thickening of the mucosa inferior to this at the C6-
C7 and C7 and T1 levels .  I cannot definitely identify a mass at
this level but on some the images there is suggestion of filling
defect.  This may be only represent contour abnormality but I
cannot exclude a subtle mass in the esophagus at the cervical
thoracic junction.

No Geist diverticulum is evident.

 There is mild dilatation of the upper and mid thoracic esophagus
with slight narrowing of the distal thoracic esophagus in
comparison.  I feel that the more distal portion is of normal
caliber with the more proximal portion being abnormally dilated.

No stricture was evident.  When the patient swallowed the 13 mm
barium sulfate tablet, it passed through the esophagus into the
stomach with no evidence of significant narrowing or stricture.

 No esophageal obstruction was evident.

No reflux occurred during examination.  No mucosal changes of
esophagitis were evident.

 Primary and secondary peristalsis appeared diminished.
IMPRESSION: At the level of C4-C5 there is posterior impression on the
posterior aspect of the cervical esophagus associated with the
degenerative spondylosis.

.  There is prominent contraction seen at the C5-C6 level with
slight possible thickening of the mucosa inferior to this at the C6-
C7 and C7 and T1 levels . This possibly could be related to the
anterior osteophytes as well. I cannot definitely identify a mass
at this level but on some the images there is suggestion of filling
defect.  This may be only represent contour abnormality but I
cannot exclude a subtle mass in the esophagus at the cervical
thoracic junction. Endoscopy could be performed to totally exclude
the possibility that there is a mass at this site.

 There is mild dilatation of the upper and mid thoracic esophagus
with slight narrowing of the distal thoracic esophagus in
comparison.  I feel that the more distal portion is of normal
caliber with the more proximal portion being abnormally dilated.

No stricture was evident.  When the patient swallowed the 13 mm
barium sulfate tablet, it passed through the esophagus into the
stomach with no evidence of significant narrowing or stricture.

No reflux occurred during the examination.  No mucosal changes of
esophagitis were evident.

 Primary and secondary peristalsis appeared diminished.

## 2011-03-04 ENCOUNTER — Ambulatory Visit (INDEPENDENT_AMBULATORY_CARE_PROVIDER_SITE_OTHER): Payer: 59 | Admitting: *Deleted

## 2011-03-04 ENCOUNTER — Encounter: Payer: Self-pay | Admitting: Internal Medicine

## 2011-03-04 ENCOUNTER — Other Ambulatory Visit: Payer: Self-pay | Admitting: Internal Medicine

## 2011-03-04 DIAGNOSIS — I5022 Chronic systolic (congestive) heart failure: Secondary | ICD-10-CM

## 2011-03-04 DIAGNOSIS — I2589 Other forms of chronic ischemic heart disease: Secondary | ICD-10-CM

## 2011-03-07 LAB — REMOTE ICD DEVICE
AL AMPLITUDE: 1.5 mv
AL IMPEDENCE ICD: 437 Ohm
ATRIAL PACING ICD: 33.07 pct
BRDY-0002LV: 60 {beats}/min
CHARGE TIME: 9.879 s
FVT: 0
LV LEAD IMPEDENCE ICD: 551 Ohm
RV LEAD IMPEDENCE ICD: 437 Ohm
TOT-0001: 1
TOT-0002: 0
TZAT-0001ATACH: 3
TZAT-0001FASTVT: 1
TZAT-0001SLOWVT: 2
TZAT-0002ATACH: NEGATIVE
TZAT-0012ATACH: 150 ms
TZAT-0012ATACH: 150 ms
TZAT-0012FASTVT: 200 ms
TZAT-0018ATACH: NEGATIVE
TZAT-0019ATACH: 6 V
TZAT-0019SLOWVT: 8 V
TZAT-0019SLOWVT: 8 V
TZAT-0020ATACH: 1.5 ms
TZAT-0020ATACH: 1.5 ms
TZAT-0020FASTVT: 1.5 ms
TZAT-0020SLOWVT: 1.5 ms
TZAT-0020SLOWVT: 1.5 ms
TZON-0003VSLOWVT: 430 ms
TZST-0001ATACH: 6
TZST-0001FASTVT: 4
TZST-0001FASTVT: 5
TZST-0001SLOWVT: 5
TZST-0002ATACH: NEGATIVE
TZST-0002ATACH: NEGATIVE
TZST-0002FASTVT: NEGATIVE
TZST-0002FASTVT: NEGATIVE
TZST-0003SLOWVT: 15 J
TZST-0003SLOWVT: 35 J
TZST-0003SLOWVT: 35 J

## 2011-03-10 NOTE — Progress Notes (Signed)
ICD remote with ICM 

## 2011-03-17 ENCOUNTER — Encounter: Payer: Self-pay | Admitting: *Deleted

## 2011-06-10 ENCOUNTER — Ambulatory Visit (INDEPENDENT_AMBULATORY_CARE_PROVIDER_SITE_OTHER): Payer: 59 | Admitting: *Deleted

## 2011-06-10 ENCOUNTER — Encounter: Payer: Self-pay | Admitting: Internal Medicine

## 2011-06-10 DIAGNOSIS — I509 Heart failure, unspecified: Secondary | ICD-10-CM

## 2011-06-10 DIAGNOSIS — I428 Other cardiomyopathies: Secondary | ICD-10-CM

## 2011-06-10 DIAGNOSIS — I4891 Unspecified atrial fibrillation: Secondary | ICD-10-CM

## 2011-06-10 DIAGNOSIS — Z95810 Presence of automatic (implantable) cardiac defibrillator: Secondary | ICD-10-CM

## 2011-06-11 LAB — REMOTE ICD DEVICE
AL AMPLITUDE: 1.4 mv
AL IMPEDENCE ICD: 437 Ohm
ATRIAL PACING ICD: 41.15 pct
BAMS-0001: 170 {beats}/min
BRDY-0003LV: 130 {beats}/min
CHARGE TIME: 10.169 s
RV LEAD AMPLITUDE: 7 mv
RV LEAD IMPEDENCE ICD: 437 Ohm
TOT-0001: 1
TOT-0002: 0
TZAT-0001ATACH: 2
TZAT-0001ATACH: 3
TZAT-0001FASTVT: 1
TZAT-0001SLOWVT: 1
TZAT-0001SLOWVT: 2
TZAT-0004SLOWVT: 8
TZAT-0004SLOWVT: 8
TZAT-0012ATACH: 150 ms
TZAT-0012ATACH: 150 ms
TZAT-0012FASTVT: 200 ms
TZAT-0012SLOWVT: 200 ms
TZAT-0012SLOWVT: 200 ms
TZAT-0018ATACH: NEGATIVE
TZAT-0018FASTVT: NEGATIVE
TZAT-0019ATACH: 6 V
TZAT-0019SLOWVT: 8 V
TZAT-0019SLOWVT: 8 V
TZAT-0020ATACH: 1.5 ms
TZAT-0020ATACH: 1.5 ms
TZAT-0020SLOWVT: 1.5 ms
TZAT-0020SLOWVT: 1.5 ms
TZON-0003SLOWVT: 340 ms
TZON-0003VSLOWVT: 430 ms
TZON-0004VSLOWVT: 32
TZST-0001ATACH: 6
TZST-0001FASTVT: 2
TZST-0001FASTVT: 4
TZST-0001FASTVT: 5
TZST-0001FASTVT: 6
TZST-0001SLOWVT: 3
TZST-0001SLOWVT: 5
TZST-0002ATACH: NEGATIVE
TZST-0002ATACH: NEGATIVE
TZST-0002FASTVT: NEGATIVE
TZST-0002FASTVT: NEGATIVE
TZST-0003SLOWVT: 15 J
TZST-0003SLOWVT: 35 J

## 2011-06-16 ENCOUNTER — Encounter: Payer: Self-pay | Admitting: *Deleted

## 2011-06-21 NOTE — Progress Notes (Signed)
Remote icd check w/icm  

## 2011-09-07 ENCOUNTER — Encounter: Payer: Self-pay | Admitting: Internal Medicine

## 2011-09-07 ENCOUNTER — Ambulatory Visit (INDEPENDENT_AMBULATORY_CARE_PROVIDER_SITE_OTHER): Payer: Medicare Other | Admitting: Internal Medicine

## 2011-09-07 VITALS — BP 140/70 | HR 60 | Wt 164.0 lb

## 2011-09-07 DIAGNOSIS — Z95810 Presence of automatic (implantable) cardiac defibrillator: Secondary | ICD-10-CM

## 2011-09-07 DIAGNOSIS — I4891 Unspecified atrial fibrillation: Secondary | ICD-10-CM

## 2011-09-07 DIAGNOSIS — I5022 Chronic systolic (congestive) heart failure: Secondary | ICD-10-CM

## 2011-09-07 DIAGNOSIS — I2589 Other forms of chronic ischemic heart disease: Secondary | ICD-10-CM

## 2011-09-07 LAB — ICD DEVICE OBSERVATION
AL IMPEDENCE ICD: 380 Ohm
AL THRESHOLD: 1.25 V
BAMS-0001: 170 {beats}/min
BATTERY VOLTAGE: 3.0435 V
FVT: 0
LV LEAD THRESHOLD: 1 V
PACEART VT: 0
RV LEAD THRESHOLD: 0.75 V
TOT-0002: 0
TZAT-0001ATACH: 1
TZAT-0001ATACH: 2
TZAT-0001ATACH: 3
TZAT-0001FASTVT: 1
TZAT-0001SLOWVT: 2
TZAT-0002ATACH: NEGATIVE
TZAT-0004SLOWVT: 8
TZAT-0004SLOWVT: 8
TZAT-0005SLOWVT: 88 pct
TZAT-0005SLOWVT: 91 pct
TZAT-0012ATACH: 150 ms
TZAT-0012ATACH: 150 ms
TZAT-0012FASTVT: 200 ms
TZAT-0012SLOWVT: 200 ms
TZAT-0012SLOWVT: 200 ms
TZAT-0013SLOWVT: 2
TZAT-0013SLOWVT: 2
TZAT-0018ATACH: NEGATIVE
TZAT-0018ATACH: NEGATIVE
TZAT-0018FASTVT: NEGATIVE
TZAT-0019ATACH: 6 V
TZAT-0019FASTVT: 8 V
TZAT-0020ATACH: 1.5 ms
TZAT-0020ATACH: 1.5 ms
TZAT-0020FASTVT: 1.5 ms
TZAT-0020SLOWVT: 1.5 ms
TZAT-0020SLOWVT: 1.5 ms
TZON-0003ATACH: 350 ms
TZON-0003SLOWVT: 340 ms
TZON-0003VSLOWVT: 430 ms
TZON-0004SLOWVT: 28
TZON-0004VSLOWVT: 32
TZST-0001ATACH: 4
TZST-0001ATACH: 6
TZST-0001FASTVT: 2
TZST-0001FASTVT: 6
TZST-0001SLOWVT: 3
TZST-0001SLOWVT: 5
TZST-0001SLOWVT: 6
TZST-0002ATACH: NEGATIVE
TZST-0002FASTVT: NEGATIVE
TZST-0002FASTVT: NEGATIVE
TZST-0002FASTVT: NEGATIVE
TZST-0003SLOWVT: 15 J
TZST-0003SLOWVT: 25 J
VENTRICULAR PACING ICD: 98 pct

## 2011-09-07 NOTE — Assessment & Plan Note (Signed)
His symptoms are currently class II. He will continue his current medical therapy, and maintain a low-sodium diet. 

## 2011-09-07 NOTE — Assessment & Plan Note (Signed)
He denies anginal symptoms. He will continue his current medical therapy. He is limited by difficulty with walking.

## 2011-09-07 NOTE — Progress Notes (Signed)
HPI Dustin [name] returns today for followup. He is a pleasant elderly man with an ICM, chronic systolic CHF, LBBB, s/p BiV ICD implant. In the interim he has been stable. 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 No recent syncope or ICD shock. No peripheral edema. His appetite is ok.  Allergies  Allergen Reactions  . Codeine     REACTION: "makes me crazy"  . Sulfamethoxazole W-Trimethoprim      Current Outpatient Prescriptions  Medication Sig Dispense Refill  . allopurinol (ZYLOPRIM) 300 MG tablet Take 300 mg by mouth daily.        Marland Kitchen ALPRAZolam (XANAX) 1 MG tablet Take 1 mg by mouth daily.      Marland Kitchen aspirin 81 MG tablet Take 81 mg by mouth daily.        . benzonatate (TESSALON) 200 MG capsule Take 200 mg by mouth 3 (three) times daily as needed.        . cetirizine (ZYRTEC) 10 MG chewable tablet Chew 10 mg by mouth daily.        . Cholecalciferol (VITAMIN D-3 PO) Take by mouth. daily       . diphenhydrAMINE (BENADRYL) 25 MG tablet Take 50 mg by mouth at bedtime.      . furosemide (LASIX) 80 MG tablet Take 80 mg by mouth daily.      Marland Kitchen glipiZIDE (GLUCOTROL) 5 MG tablet Take 5 mg by mouth daily.       Marland Kitchen guaiFENesin (MUCINEX) 600 MG 12 hr tablet Take 1,200 mg by mouth daily.       Marland Kitchen HYDROcodone-acetaminophen (VICODIN) 5-500 MG per tablet Take 1 tablet by mouth every 6 (six) hours as needed.      . iron polysaccharides (NIFEREX) 150 MG capsule Take 150 mg by mouth daily.       . isosorbide-hydrALAZINE (BIDIL) 20-37.5 MG per tablet Take 1 tablet by mouth 3 (three) times daily.        Marland Kitchen levalbuterol (XOPENEX HFA) 45 MCG/ACT inhaler Inhale 1-2 puffs into the lungs every 4 (four) hours as needed.        . metolazone (ZAROXOLYN) 5 MG tablet Take 5 mg by mouth as needed.        . nebivolol (BYSTOLIC) 5 MG tablet Take 1/2 (half) tab qd      . nitroGLYCERIN (NITROSTAT) 0.4 MG SL tablet Place 0.4 mg under the tongue every 5 (five) minutes as needed.        . NON  FORMULARY OXYGEN  As directed       . omeprazole (PRILOSEC) 20 MG capsule Take 20 mg by mouth daily.        Marland Kitchen oxyCODONE (OXYCONTIN) 10 MG 12 hr tablet Take 10 mg by mouth as needed.      . polyethylene glycol powder (MIRALAX) powder Take 17 g by mouth daily.        . pravastatin (PRAVACHOL) 40 MG tablet Take 40 mg by mouth daily.        . Sennosides (SENNA LAX PO) 1 AM 3 PM      . sertraline (ZOLOFT) 100 MG tablet Take 100 mg by mouth daily.       . traMADol (ULTRAM) 50 MG tablet 4 TABS PO QD         Past Medical History  Diagnosis Date  . IHD (ischemic heart disease)      -  non-ST elevation MI in July 2009. and NSTEMI Jan  2010 in setting of RLL CAP. 99% diagonal  disease s/p stent July of 2009. Cath Jan 2010 showed  occulusion of SVG to RCA   LVEF 45%. rec med rx   - Echo 10/29/08 EF 25%  . PAD (peripheral artery disease)   . Hyperlipidemia   . Hypertension   . Obstructive sleep apnea      Refuses to wear his CPAP.  Not on oxygen   . Vocal cord dysfunction     with Botox injection at [facility] around 2005  per patient.     - Patient's swallowing difficulties may be related to vocal    cord dysfunction, recommended to be followed up at [facility]  . Stroke     History of left brain cerebrovascular accident in 1998 without   . Dysphagia       -swallowing study nl oropharyngeal swallow, brief pause  at top of the esophagus which clears with liquid wash or small bites.    Rec  regular diet with thin liquids and continue management of    esophageal dysmotility.   - Barium swallow 10/31/08  mild dilation upper esosphagus, moderate slowing, can't r/o mass  . Anemia         03/21/2008 hgb 10.9, MCV 86, Stool occult blood postiive -> On IV Iron   . COPD (chronic obstructive pulmonary disease)     - Hypoxemia, former smoker-  FEV1 1.86 (72%) , ratio 68% (07/26/08) - FEV1 1.61 (62%) , ratio 74    ( 10/29/08) with no insp or exp truncation on f/v loop    ROS:   All  systems reviewed and negative except as noted in the HPI.   Past Surgical History  Procedure Date  . Coronary artery bypass graft   . Abdominal aortic aneurysm repair   . Aortobifemoral bypass graft in the past   . Icd insertion     ICD Medtronic     Family History  Problem Relation Age of Onset  . COPD Sister   . Heart disease Sister   . Alzheimer's disease Sister   . Lung cancer Sister   . Heart disease Brother   . Alzheimer's disease Brother      History   Social History  . Marital Status: Married    Spouse Name: N/A    Number of Children: 5  . Years of Education: N/A   Occupational History  . Not on file.   Social History Main Topics  . Smoking status: Former Smoker -- 0.5 packs/day for 15 years    Quit date: 02/23/1980  . Smokeless tobacco: Not on file  . Alcohol Use: Yes  . Drug Use:   . Sexually Active:    Other Topics Concern  . Not on file   Social History Narrative   Quit smoking in 1982, smoked 1/2 pack a day for 15 years.MarriedRetiredPatient has nine siblings He worked at [employer] and was exposed to  whiskey fumes for 8 years.  He is married.  Smoked for 20 years 3-4  cigarettes a day, but quit 30 years ago.  Does not drink other than  socially.  He is retired, has 5 children and other grandchildren.      BP 140/70  Pulse 60  Wt 164 lb (74.39 kg)  SpO2 95%  Physical Exam:  Well appearing elderly man,NAD HEENT: Unremarkable Neck:  No JVD, no thyromegally Lungs:  Clear except for rales in the bases bilaterally.  No wheezes or rhonchi. HEART:  Regular rate rhythm, no murmurs, no rubs, no clicks Abd:  soft, positive bowel sounds, no organomegally, no rebound, no guarding Ext:  2 plus pulses, no edema, no cyanosis, no clubbing Skin:  No rashes no nodules Neuro:  CN II through XII intact, motor grossly intact.  DEVICE  Normal device function.  See PaceArt for details.   Assess/Plan:

## 2011-09-07 NOTE — Assessment & Plan Note (Signed)
His device is working normally. We'll plan to recheck in several months. 

## 2011-09-07 NOTE — Patient Instructions (Addendum)
Your physician wants you to follow-up in: 12 months with Dr Court Joy will receive a reminder letter in the mail two months in advance. If you don't receive a letter, please call our office to schedule the follow-up appointment.   Remote monitoring is used to monitor your Pacemaker of ICD from home. This monitoring reduces the number of office visits required to check your device to one time per year. It allows [place] to keep an eye on the functioning of your device to ensure it is working properly. You are scheduled for a device check from home on 12/13/11. You may send your transmission at any time that day. If you have a wireless device, the transmission will be sent automatically. After your physician reviews your transmission, you will receive a postcard with your next transmission date.

## 2011-10-21 DIAGNOSIS — R498 Other voice and resonance disorders: Secondary | ICD-10-CM

## 2011-10-21 HISTORY — DX: Other voice and resonance disorders: R49.8

## 2011-11-06 ENCOUNTER — Emergency Department (HOSPITAL_COMMUNITY)
Admission: EM | Admit: 2011-11-06 | Discharge: 2011-11-07 | Disposition: A | Discharge status: Home / Self Care | Payer: Medicare Other | Attending: Emergency Medicine | Admitting: Emergency Medicine

## 2011-11-06 ENCOUNTER — Emergency Department (HOSPITAL_COMMUNITY): Payer: Medicare Other

## 2011-11-06 ENCOUNTER — Encounter (HOSPITAL_COMMUNITY): Payer: Self-pay | Admitting: *Deleted

## 2011-11-06 DIAGNOSIS — Z951 Presence of aortocoronary bypass graft: Secondary | ICD-10-CM | POA: Insufficient documentation

## 2011-11-06 DIAGNOSIS — I10 Essential (primary) hypertension: Secondary | ICD-10-CM | POA: Insufficient documentation

## 2011-11-06 DIAGNOSIS — K5730 Diverticulosis of large intestine without perforation or abscess without bleeding: Secondary | ICD-10-CM | POA: Insufficient documentation

## 2011-11-06 DIAGNOSIS — R109 Unspecified abdominal pain: Secondary | ICD-10-CM | POA: Insufficient documentation

## 2011-11-06 LAB — COMPREHENSIVE METABOLIC PANEL
ALT: 16 U/L (ref 0–53)
AST: 20 U/L (ref 0–37)
Albumin: 3.3 g/dL — ABNORMAL LOW (ref 3.5–5.2)
CO2: 32 mEq/L (ref 19–32)
Chloride: 100 mEq/L (ref 96–112)
Creatinine, Ser: 1.37 mg/dL — ABNORMAL HIGH (ref 0.50–1.35)
GFR calc non Af Amer: 46 mL/min — ABNORMAL LOW (ref >90)
Potassium: 3.1 mEq/L — ABNORMAL LOW (ref 3.5–5.1)
Sodium: 140 mEq/L (ref 135–145)
Total Bilirubin: 0.5 mg/dL (ref 0.3–1.2)

## 2011-11-06 LAB — URINALYSIS, MICROSCOPIC ONLY
Glucose, UA: NEGATIVE mg/dL
Hgb urine dipstick: NEGATIVE
Ketones, ur: NEGATIVE mg/dL
Leukocytes, UA: NEGATIVE
Protein, ur: NEGATIVE mg/dL
Urine-Other: NONE SEEN
pH: 6 (ref 5.0–8.0)

## 2011-11-06 LAB — CBC WITH DIFFERENTIAL/PLATELET
Basophils Absolute: 0 10*3/uL (ref 0.0–0.1)
Basophils Relative: 0 % (ref 0–1)
HCT: 36.9 % — ABNORMAL LOW (ref 39.0–52.0)
Lymphocytes Relative: 24 % (ref 12–46)
MCHC: 33.1 g/dL (ref 30.0–36.0)
Neutro Abs: 5 10*3/uL (ref 1.7–7.7)
Neutrophils Relative %: 66 % (ref 43–77)
Platelets: 123 10*3/uL — ABNORMAL LOW (ref 150–400)
RDW: 15.4 % (ref 11.5–15.5)
WBC: 7.5 10*3/uL (ref 4.0–10.5)

## 2011-11-06 MED ORDER — POTASSIUM CHLORIDE CRYS ER 20 MEQ PO TBCR
40.0000 meq | EXTENDED_RELEASE_TABLET | Freq: Once | ORAL | Status: AC
  Administered 2011-11-06: 40 meq via ORAL
  Filled 2011-11-06: qty 2

## 2011-11-06 MED ORDER — HYDROMORPHONE HCL PF 1 MG/ML IJ SOLN
0.5000 mg | Freq: Once | INTRAMUSCULAR | Status: AC
  Administered 2011-11-06: 0.5 mg via INTRAVENOUS
  Filled 2011-11-06: qty 1

## 2011-11-06 MED ORDER — IOHEXOL 300 MG/ML  SOLN
100.0000 mL | Freq: Once | INTRAMUSCULAR | Status: AC | PRN
  Administered 2011-11-06: 100 mL via INTRAVENOUS

## 2011-11-06 MED ORDER — ONDANSETRON HCL 4 MG/2ML IJ SOLN
4.0000 mg | Freq: Once | INTRAMUSCULAR | Status: AC
  Administered 2011-11-06: 4 mg via INTRAVENOUS
  Filled 2011-11-06: qty 2

## 2011-11-06 NOTE — ED Notes (Signed)
[id number]<AV> Expected date:<BR> Expected time:<BR> Means of arrival:<BR> Comments:<BR> Rm 4, Medic 241, 84 M, Abd Pain

## 2011-11-06 NOTE — ED Notes (Signed)
PA at bedside.

## 2011-11-06 NOTE — ED Provider Notes (Signed)
History     CSN: [id number]  Arrival date & time 11/06/11  1952   First MD Initiated Contact with Patient 11/06/11 2017      Chief Complaint  Patient presents with  . Abdominal Pain    (Consider location/radiation/quality/duration/timing/severity/associated sxs/prior treatment) HPI History provided by pt.   Pt has had gradually worsening left-sided abd pain for the past 2 days.  Describes as dull and achy.  Associated w/ N/V.  Denies fever, CP, worse than baseline SOB, diarrhea, hematemesis/hematochezia/melena and urinary sx.   Most recent BM yesterday and nml.  Pt had an MI in 1994 and current sx are similar to what he experienced at that time.  However, he has chronic angina since then and pain located in chest only.  Took 3 ntg w/ some relief this evening.  Has had diverticulitis in the past and believes that is what he has now. Past abd surgeries include aneurysm repair.  He is not anti-coagulated.    Past Medical History  Diagnosis Date  . IHD (ischemic heart disease)      -  non-ST elevation MI in July 2009. and NSTEMI Jan 2010 in setting of RLL CAP. 99% diagonal  disease s/p stent July of 2009. Cath Jan 2010 showed  occulusion of SVG to RCA   LVEF 45%. rec med rx   - Echo 10/29/08 EF 25%  . PAD (peripheral artery disease)   . Hyperlipidemia   . Hypertension   . Obstructive sleep apnea      Refuses to wear his CPAP.  Not on oxygen   . Vocal cord dysfunction     with Botox injection at [facility] around 2005  per patient.     - Patient's swallowing difficulties may be related to vocal    cord dysfunction, recommended to be followed up at [facility]  . Stroke     History of left brain cerebrovascular accident in 1998 without   . Dysphagia       -swallowing study nl oropharyngeal swallow, brief pause  at top of the esophagus which clears with liquid wash or small bites.    Rec  regular diet with thin liquids and continue management of    esophageal  dysmotility.   - Barium swallow 10/31/08  mild dilation upper esosphagus, moderate slowing, can't r/o mass  . Anemia         03/21/2008 hgb 10.9, MCV 86, Stool occult blood postiive -> On IV Iron   . COPD (chronic obstructive pulmonary disease)     - Hypoxemia, former smoker-  FEV1 1.86 (72%) , ratio 68% (07/26/08) - FEV1 1.61 (62%) , ratio 74    ( 10/29/08) with no insp or exp truncation on f/v loop    Past Surgical History  Procedure Date  . Coronary artery bypass graft   . Abdominal aortic aneurysm repair   . Aortobifemoral bypass graft in the past   . Icd insertion     ICD [employer]    Family History  Problem Relation Age of Onset  . COPD Sister   . Heart disease Sister   . Alzheimer's disease Sister   . Lung cancer Sister   . Heart disease Brother   . Alzheimer's disease Brother     History  Substance Use Topics  . Smoking status: Former Smoker -- 0.5 packs/day for 15 years    Quit date: 02/23/1980  . Smokeless tobacco: Not on file  . Alcohol Use: Yes  Review of Systems  All other systems reviewed and are negative.    Allergies  Codeine and Sulfamethoxazole w-trimethoprim  Home Medications   Current Outpatient Rx  Name Route Sig Dispense Refill  . ALLOPURINOL 300 MG PO TABS Oral Take 300 mg by mouth daily.      Marland Kitchen ALPRAZOLAM 1 MG PO TABS Oral Take 1 mg by mouth daily.    . ASPIRIN 81 MG PO TABS Oral Take 81 mg by mouth daily.      Marland Kitchen BENZONATATE 200 MG PO CAPS Oral Take 200 mg by mouth 3 (three) times daily as needed.      Marland Kitchen CETIRIZINE HCL 10 MG PO CHEW Oral Chew 10 mg by mouth daily.      Marland Kitchen VITAMIN D-3 PO Oral Take by mouth. daily     . DIPHENHYDRAMINE HCL 25 MG PO TABS Oral Take 50 mg by mouth at bedtime.    . FUROSEMIDE 80 MG PO TABS Oral Take 80 mg by mouth daily.    Marland Kitchen GLIPIZIDE 5 MG PO TABS Oral Take 5 mg by mouth daily.     . GUAIFENESIN ER 600 MG PO TB12 Oral Take 1,200 mg by mouth daily.     Marland Kitchen HYDROCODONE-ACETAMINOPHEN 5-500 MG PO TABS Oral Take 1  tablet by mouth every 6 (six) hours as needed.    Marland Kitchen POLYSACCHARIDE IRON COMPLEX 150 MG PO CAPS Oral Take 150 mg by mouth daily.     . ISOSORB DINITRATE-HYDRALAZINE 20-37.5 MG PO TABS Oral Take 1 tablet by mouth 3 (three) times daily.      Marland Kitchen LEVALBUTEROL TARTRATE 45 MCG/ACT IN AERO Inhalation Inhale 1-2 puffs into the lungs every 4 (four) hours as needed.      Marland Kitchen METOLAZONE 5 MG PO TABS Oral Take 5 mg by mouth as needed.      . NEBIVOLOL HCL 5 MG PO TABS  Take 1/2 (half) tab qd    . NITROGLYCERIN 0.4 MG SL SUBL Sublingual Place 0.4 mg under the tongue every 5 (five) minutes as needed.      . NON FORMULARY  OXYGEN  As directed     . OMEPRAZOLE 20 MG PO CPDR Oral Take 20 mg by mouth daily.      . OXYCODONE HCL ER 10 MG PO TB12 Oral Take 10 mg by mouth as needed.    Marland Kitchen POLYETHYLENE GLYCOL 3350 PO POWD Oral Take 17 g by mouth daily.      Marland Kitchen PRAVASTATIN SODIUM 40 MG PO TABS Oral Take 40 mg by mouth daily.      . SENNA LAX PO  1 AM 3 PM    . SERTRALINE HCL 100 MG PO TABS Oral Take 100 mg by mouth daily.     . TRAMADOL HCL 50 MG PO TABS  4 TABS PO QD      BP 132/50  Pulse 63  Temp 98.3 F (36.8 C) (Oral)  Resp 14  SpO2 94%  Physical Exam  Nursing note and vitals reviewed. Constitutional: He is oriented to person, place, and time. He appears well-developed and well-nourished. No distress.  HENT:  Head: Normocephalic and atraumatic.  Eyes:       Normal appearance  Neck: Normal range of motion.  Cardiovascular: Normal rate and regular rhythm.   Pulmonary/Chest: Effort normal and breath sounds normal. No respiratory distress.       Mild respiratory distress; reported to be typical.   Abdominal: Soft. Bowel sounds are normal. He exhibits no  distension and no mass. There is no rebound and no guarding.       Mid-line, vertical surgical scar from chest to inferior to umbilicus.  Diffuse abd ttp, worst just left of umbilicus.    Genitourinary:       No CVA tenderness  Musculoskeletal: Normal  range of motion.  Neurological: He is alert and oriented to person, place, and time.  Skin: Skin is warm and dry. No rash noted.  Psychiatric: He has a normal mood and affect. His behavior is normal.    ED Course  Procedures (including critical care time)   Date: 11/07/2011  Rate: 68  Rhythm: Paced  QRS Axis: normal  Intervals: normal  ST/T Wave abnormalities: normal  Conduction Disutrbances:none  Narrative Interpretation:   Old EKG Reviewed: unchanged   Labs Reviewed  CBC WITH DIFFERENTIAL - Abnormal; Notable for the following:    RBC 3.87 (*)     Hemoglobin 12.2 (*)     HCT 36.9 (*)     Platelets 123 (*)     All other components within normal limits  COMPREHENSIVE METABOLIC PANEL - Abnormal; Notable for the following:    Potassium 3.1 (*)     BUN 30 (*)     Creatinine, Ser 1.37 (*)     Albumin 3.3 (*)     GFR calc non Af Amer 46 (*)     GFR calc Af Amer 54 (*)     All other components within normal limits  URINALYSIS, WITH MICROSCOPIC - Abnormal; Notable for the following:    APPearance CLOUDY (*)     All other components within normal limits  LIPASE, BLOOD  TROPONIN I   Ct Abdomen Pelvis W Contrast  11/06/2011  *RADIOLOGY REPORT*  Clinical Data: Left-sided abdominal pain, history of AAA repair and diverticulitis.  CT ABDOMEN AND PELVIS WITH CONTRAST  Technique:  Multidetector CT imaging of the abdomen and pelvis was performed following the standard protocol during bolus administration of intravenous contrast.  Contrast: OMNIPAQUE IOHEXOL 300 MG/ML  SOLN  Comparison: 05/27/2004  Findings: Cardiomegaly.  Partially imaged pacing wires terminate within the right ventricle and coronary sinus or epicardium.  Lung bases are predominately clear.  Unremarkable liver.  Distended gallbladder.  No wall thickening or biliary ductal dilatation.  No radiodense stones.  Punctate splenic calcifications in keeping with sequelae of prior infection. Atrophic pancreas.  No focal  abnormality.  Unremarkable right adrenal gland and left adrenal gland.  Lobular renal contours with inferior scarring on the right. Symmetric enhancement.  No hydronephrosis or hydroureter.  No bowel obstruction.  Colonic diverticulosis.  No overt evidence for diverticulitis.  Normal appendix.  No free intraperitoneal air or fluid.  No lymphadenopathy.  Sequelae of prior aortic repair. Grossly similar to prior. Occluded IMA with reconstitution just below the origin.  Advanced atherosclerosis of the bilateral renal arteries.  Accessory renal artery on the left.  Mild bladder wall thickening.  Prostatomegaly.  Fat containing inguinal hernias, right greater than left.  Hernia on the right also contains a knuckle of small bowel.  No evidence for incarceration or obstruction.  Osteopenia and multilevel degenerative changes.  The no acute osseous finding.  IMPRESSION: Colonic diverticulosis.  No overt evidence for diverticulitis.  Status post aortic repair, appears similar to prior.  Diminutive renal arteries and inferior pole scarring on the right.  Small bowel containing right inguinal hernia.  No obstruction or evidence for incarceration.   Original Report Authenticated By: Waneta Martins, M.D.  1. Abdominal pain       MDM  78yo M presents w/ left-sided abd pain.  Has h/o diverticulitis as well as MI, both of which presented similarly.  On exam, afebrile, NAD, abd soft, diffusely tender but worst left of umbilicus.  EKG, labs and CT abd/pelvis pending.  0.5mg  IV dilaudid and zofran ordered for sx.  Will reassess shortly.  8:45 PM   Pt reports that pain is improved but he would like another dose of dilaudid.   Ordered.  9:50 PM   Labs and CT unremarkable.  Results discussed w/ patient.  I recommended admission for observation for r/o MI d/t similar characteristics of pain to his MI as well as relief w/ ntg at home.  Pt declines and requests discharge.  He is hospice care.  He understands the risks  of going home.  He will f/u with his PCP this week and return for worsening sx.  He has percocet and zofran at home.  12:43 AM         Otilio Miu, PA 11/07/11 [phone]

## 2011-11-06 NOTE — ED Notes (Signed)
Per EMS: patient c/o LUQ pain that started yesterday. Family at bedside sts pain began 2 days ago. Patient denying n/v/d. Patient took 3 nitro earlier today but denying chest pain today. Pt last took nitro at approximately 1945. Pt rating pain 8/10.

## 2011-11-07 NOTE — ED Provider Notes (Signed)
Medical screening examination/treatment/procedure(s) were conducted as a shared visit with non-physician practitioner(s) and myself.  I personally evaluated the patient during the encounter  Pt without signs of acute abd, abd ct neg, cardiac eval neg so far but current sx are similar to his anginal equivalent, will admit for obs  Toy Baker, MD 11/07/11 0010

## 2011-11-08 NOTE — ED Provider Notes (Signed)
Medical screening examination/treatment/procedure(s) were performed by non-physician practitioner and as supervising physician I was immediately available for consultation/collaboration.  Khrystyne Arpin T Kalief Kattner, MD 11/08/11 0930 

## 2011-12-09 DIAGNOSIS — J690 Pneumonitis due to inhalation of food and vomit: Secondary | ICD-10-CM

## 2011-12-09 HISTORY — DX: Pneumonitis due to inhalation of food and vomit: J69.0

## 2011-12-13 ENCOUNTER — Encounter: Payer: Medicare Other | Admitting: *Deleted

## 2011-12-17 ENCOUNTER — Encounter: Payer: Self-pay | Admitting: *Deleted

## 2011-12-23 ENCOUNTER — Ambulatory Visit (INDEPENDENT_AMBULATORY_CARE_PROVIDER_SITE_OTHER): Payer: Medicare Other | Admitting: *Deleted

## 2011-12-23 ENCOUNTER — Encounter: Payer: Self-pay | Admitting: Internal Medicine

## 2011-12-23 ENCOUNTER — Telehealth: Payer: Self-pay | Admitting: Physician Assistant

## 2011-12-23 DIAGNOSIS — Z95810 Presence of automatic (implantable) cardiac defibrillator: Secondary | ICD-10-CM

## 2011-12-23 DIAGNOSIS — I5022 Chronic systolic (congestive) heart failure: Secondary | ICD-10-CM

## 2011-12-23 NOTE — Telephone Encounter (Signed)
Pt has a monitor of some type (probably his ICD). It did not fire, but he was trying to transmit and was unable to do so. He tried to transmit again tonight and wanted to make sure it went. He had some (unclear) symptoms yesterday, but is not having any today.   Advised him the office could call him tomorrow to make sure everything is working OK. He was OK with that and did not feel he needed anything else right now.

## 2011-12-27 NOTE — Telephone Encounter (Signed)
Left message for patient, transmission received. 

## 2012-01-04 LAB — REMOTE ICD DEVICE
AL AMPLITUDE: 1.4 mv
AL IMPEDENCE ICD: 418 Ohm
BAMS-0001: 170 {beats}/min
BATTERY VOLTAGE: 3.0026 V
CHARGE TIME: 10.42 s
LV LEAD THRESHOLD: 1 V
RV LEAD AMPLITUDE: 12.1 mv
RV LEAD IMPEDENCE ICD: 437 Ohm
TOT-0001: 1
TOT-0002: 0
TOT-0006: 20100929000000
TZAT-0001ATACH: 2
TZAT-0001ATACH: 3
TZAT-0001FASTVT: 1
TZAT-0001SLOWVT: 1
TZAT-0001SLOWVT: 2
TZAT-0004SLOWVT: 8
TZAT-0004SLOWVT: 8
TZAT-0005SLOWVT: 88 pct
TZAT-0012ATACH: 150 ms
TZAT-0012ATACH: 150 ms
TZAT-0012ATACH: 150 ms
TZAT-0012FASTVT: 200 ms
TZAT-0012SLOWVT: 200 ms
TZAT-0018ATACH: NEGATIVE
TZAT-0018FASTVT: NEGATIVE
TZAT-0019FASTVT: 8 V
TZAT-0020ATACH: 1.5 ms
TZAT-0020FASTVT: 1.5 ms
TZAT-0020SLOWVT: 1.5 ms
TZAT-0020SLOWVT: 1.5 ms
TZON-0003ATACH: 350 ms
TZON-0003SLOWVT: 340 ms
TZON-0003VSLOWVT: 430 ms
TZON-0004VSLOWVT: 32
TZST-0001ATACH: 4
TZST-0001ATACH: 6
TZST-0001FASTVT: 2
TZST-0001FASTVT: 4
TZST-0001FASTVT: 6
TZST-0001SLOWVT: 3
TZST-0001SLOWVT: 5
TZST-0002FASTVT: NEGATIVE
TZST-0002FASTVT: NEGATIVE
TZST-0003SLOWVT: 15 J
TZST-0003SLOWVT: 35 J

## 2012-01-11 ENCOUNTER — Encounter: Payer: Self-pay | Admitting: *Deleted

## 2012-01-12 DIAGNOSIS — J449 Chronic obstructive pulmonary disease, unspecified: Secondary | ICD-10-CM

## 2012-01-12 DIAGNOSIS — Z9181 History of falling: Secondary | ICD-10-CM

## 2012-01-12 DIAGNOSIS — N138 Other obstructive and reflux uropathy: Secondary | ICD-10-CM

## 2012-01-12 HISTORY — DX: Benign prostatic hyperplasia with lower urinary tract symptoms: N13.8

## 2012-01-12 HISTORY — DX: History of falling: Z91.81

## 2012-01-12 HISTORY — DX: Chronic obstructive pulmonary disease, unspecified: J44.9

## 2012-01-25 ENCOUNTER — Inpatient Hospital Stay (HOSPITAL_COMMUNITY)
Admission: EM | Admit: 2012-01-25 | Discharge: 2012-02-02 | DRG: 727 | Disposition: A | Discharge status: Skilled Nursing Facility | Attending: Internal Medicine | Admitting: Internal Medicine

## 2012-01-25 ENCOUNTER — Emergency Department (HOSPITAL_COMMUNITY)

## 2012-01-25 ENCOUNTER — Encounter (HOSPITAL_COMMUNITY): Payer: Self-pay | Admitting: Emergency Medicine

## 2012-01-25 DIAGNOSIS — T448X5A Adverse effect of centrally-acting and adrenergic-neuron-blocking agents, initial encounter: Secondary | ICD-10-CM | POA: Diagnosis not present

## 2012-01-25 DIAGNOSIS — N453 Epididymo-orchitis: Secondary | ICD-10-CM

## 2012-01-25 DIAGNOSIS — R5383 Other fatigue: Secondary | ICD-10-CM

## 2012-01-25 DIAGNOSIS — T4275XA Adverse effect of unspecified antiepileptic and sedative-hypnotic drugs, initial encounter: Secondary | ICD-10-CM | POA: Diagnosis not present

## 2012-01-25 DIAGNOSIS — I509 Heart failure, unspecified: Secondary | ICD-10-CM | POA: Diagnosis present

## 2012-01-25 DIAGNOSIS — R059 Cough, unspecified: Secondary | ICD-10-CM

## 2012-01-25 DIAGNOSIS — I2589 Other forms of chronic ischemic heart disease: Secondary | ICD-10-CM

## 2012-01-25 DIAGNOSIS — I5022 Chronic systolic (congestive) heart failure: Secondary | ICD-10-CM

## 2012-01-25 DIAGNOSIS — R05 Cough: Secondary | ICD-10-CM

## 2012-01-25 DIAGNOSIS — N452 Orchitis: Principal | ICD-10-CM | POA: Diagnosis present

## 2012-01-25 DIAGNOSIS — E119 Type 2 diabetes mellitus without complications: Secondary | ICD-10-CM | POA: Diagnosis present

## 2012-01-25 DIAGNOSIS — J45909 Unspecified asthma, uncomplicated: Secondary | ICD-10-CM

## 2012-01-25 DIAGNOSIS — J8409 Other alveolar and parieto-alveolar conditions: Secondary | ICD-10-CM

## 2012-01-25 DIAGNOSIS — R0602 Shortness of breath: Secondary | ICD-10-CM

## 2012-01-25 DIAGNOSIS — R0902 Hypoxemia: Secondary | ICD-10-CM | POA: Diagnosis not present

## 2012-01-25 DIAGNOSIS — J4489 Other specified chronic obstructive pulmonary disease: Secondary | ICD-10-CM

## 2012-01-25 DIAGNOSIS — I4891 Unspecified atrial fibrillation: Secondary | ICD-10-CM

## 2012-01-25 DIAGNOSIS — G9349 Other encephalopathy: Secondary | ICD-10-CM | POA: Diagnosis present

## 2012-01-25 DIAGNOSIS — Z66 Do not resuscitate: Secondary | ICD-10-CM | POA: Diagnosis present

## 2012-01-25 DIAGNOSIS — J209 Acute bronchitis, unspecified: Secondary | ICD-10-CM

## 2012-01-25 DIAGNOSIS — J449 Chronic obstructive pulmonary disease, unspecified: Secondary | ICD-10-CM

## 2012-01-25 DIAGNOSIS — R1312 Dysphagia, oropharyngeal phase: Secondary | ICD-10-CM

## 2012-01-25 DIAGNOSIS — Z95810 Presence of automatic (implantable) cardiac defibrillator: Secondary | ICD-10-CM

## 2012-01-25 DIAGNOSIS — F0390 Unspecified dementia without behavioral disturbance: Secondary | ICD-10-CM | POA: Diagnosis present

## 2012-01-25 DIAGNOSIS — I2510 Atherosclerotic heart disease of native coronary artery without angina pectoris: Secondary | ICD-10-CM | POA: Diagnosis present

## 2012-01-25 DIAGNOSIS — G9340 Encephalopathy, unspecified: Secondary | ICD-10-CM

## 2012-01-25 DIAGNOSIS — L270 Generalized skin eruption due to drugs and medicaments taken internally: Secondary | ICD-10-CM | POA: Diagnosis not present

## 2012-01-25 DIAGNOSIS — R531 Weakness: Secondary | ICD-10-CM

## 2012-01-25 DIAGNOSIS — N3289 Other specified disorders of bladder: Secondary | ICD-10-CM | POA: Diagnosis not present

## 2012-01-25 DIAGNOSIS — B9689 Other specified bacterial agents as the cause of diseases classified elsewhere: Secondary | ICD-10-CM | POA: Diagnosis present

## 2012-01-25 DIAGNOSIS — N451 Epididymitis: Secondary | ICD-10-CM

## 2012-01-25 DIAGNOSIS — R5381 Other malaise: Secondary | ICD-10-CM

## 2012-01-25 DIAGNOSIS — N183 Chronic kidney disease, stage 3 unspecified: Secondary | ICD-10-CM

## 2012-01-25 DIAGNOSIS — Z951 Presence of aortocoronary bypass graft: Secondary | ICD-10-CM

## 2012-01-25 LAB — COMPREHENSIVE METABOLIC PANEL
Alkaline Phosphatase: 117 U/L (ref 39–117)
BUN: 34 mg/dL — ABNORMAL HIGH (ref 6–23)
CO2: 36 mEq/L — ABNORMAL HIGH (ref 19–32)
Chloride: 92 mEq/L — ABNORMAL LOW (ref 96–112)
GFR calc Af Amer: 45 mL/min — ABNORMAL LOW (ref >90)
Glucose, Bld: 107 mg/dL — ABNORMAL HIGH (ref 70–99)
Potassium: 3.5 mEq/L (ref 3.5–5.1)
Total Bilirubin: 0.6 mg/dL (ref 0.3–1.2)

## 2012-01-25 LAB — URINALYSIS, MICROSCOPIC ONLY
Bilirubin Urine: NEGATIVE
Ketones, ur: NEGATIVE mg/dL
Nitrite: NEGATIVE
Protein, ur: 30 mg/dL — AB

## 2012-01-25 LAB — CBC WITH DIFFERENTIAL/PLATELET
Hemoglobin: 11.7 g/dL — ABNORMAL LOW (ref 13.0–17.0)
Lymphocytes Relative: 18 % (ref 12–46)
Lymphs Abs: 1.6 10*3/uL (ref 0.7–4.0)
MCH: 31.2 pg (ref 26.0–34.0)
Monocytes Relative: 9 % (ref 3–12)
Neutro Abs: 6.3 10*3/uL (ref 1.7–7.7)
Neutrophils Relative %: 71 % (ref 43–77)
RBC: 3.75 MIL/uL — ABNORMAL LOW (ref 4.22–5.81)
WBC: 8.8 10*3/uL (ref 4.0–10.5)

## 2012-01-25 MED ORDER — HYDROMORPHONE HCL PF 2 MG/ML IJ SOLN
2.0000 mg | INTRAMUSCULAR | Status: DC | PRN
  Administered 2012-01-25 – 2012-01-26 (×2): 2 mg via INTRAVENOUS
  Filled 2012-01-25 (×2): qty 1

## 2012-01-25 MED ORDER — SODIUM CHLORIDE 0.9 % IV SOLN
INTRAVENOUS | Status: AC
  Administered 2012-01-25: 14:00:00 via INTRAVENOUS

## 2012-01-25 MED ORDER — ONDANSETRON HCL 4 MG/2ML IJ SOLN
4.0000 mg | Freq: Once | INTRAMUSCULAR | Status: AC
  Administered 2012-01-25: 4 mg via INTRAVENOUS
  Filled 2012-01-25: qty 2

## 2012-01-25 MED ORDER — OXYCODONE HCL 5 MG PO TABS
10.0000 mg | ORAL_TABLET | Freq: Three times a day (TID) | ORAL | Status: DC
  Administered 2012-01-25 – 2012-01-31 (×17): 10 mg via ORAL
  Administered 2012-01-31: 5 mg via ORAL
  Administered 2012-01-31 – 2012-02-02 (×6): 10 mg via ORAL
  Filled 2012-01-25 (×2): qty 2
  Filled 2012-01-25: qty 1
  Filled 2012-01-25 (×7): qty 2
  Filled 2012-01-25: qty 1
  Filled 2012-01-25 (×6): qty 2
  Filled 2012-01-25: qty 1
  Filled 2012-01-25 (×7): qty 2

## 2012-01-25 MED ORDER — HYDROMORPHONE HCL PF 1 MG/ML IJ SOLN
1.0000 mg | Freq: Once | INTRAMUSCULAR | Status: AC
  Administered 2012-01-25: 1 mg via INTRAVENOUS
  Filled 2012-01-25: qty 1

## 2012-01-25 MED ORDER — ALLOPURINOL 300 MG PO TABS
300.0000 mg | ORAL_TABLET | Freq: Every day | ORAL | Status: DC
  Administered 2012-01-26 – 2012-02-02 (×8): 300 mg via ORAL
  Filled 2012-01-25 (×8): qty 1

## 2012-01-25 MED ORDER — SERTRALINE HCL 100 MG PO TABS
100.0000 mg | ORAL_TABLET | Freq: Every day | ORAL | Status: DC
  Administered 2012-01-25 – 2012-02-02 (×9): 100 mg via ORAL
  Filled 2012-01-25 (×9): qty 1

## 2012-01-25 MED ORDER — GLIPIZIDE 5 MG PO TABS
5.0000 mg | ORAL_TABLET | Freq: Every day | ORAL | Status: DC
  Administered 2012-01-26 – 2012-02-02 (×8): 5 mg via ORAL
  Filled 2012-01-25 (×8): qty 1

## 2012-01-25 MED ORDER — DIPHENHYDRAMINE HCL 25 MG PO CAPS
50.0000 mg | ORAL_CAPSULE | Freq: Every day | ORAL | Status: DC
  Administered 2012-01-26 – 2012-02-02 (×8): 50 mg via ORAL
  Filled 2012-01-25: qty 2
  Filled 2012-01-25: qty 1
  Filled 2012-01-25: qty 2
  Filled 2012-01-25: qty 1
  Filled 2012-01-25 (×11): qty 2

## 2012-01-25 MED ORDER — HYDROMORPHONE HCL PF 1 MG/ML IJ SOLN
1.0000 mg | INTRAMUSCULAR | Status: DC | PRN
  Administered 2012-01-25: 1 mg via INTRAVENOUS

## 2012-01-25 MED ORDER — POLYSACCHARIDE IRON COMPLEX 150 MG PO CAPS
150.0000 mg | ORAL_CAPSULE | Freq: Every day | ORAL | Status: DC
  Administered 2012-01-25 – 2012-02-02 (×9): 150 mg via ORAL
  Filled 2012-01-25 (×9): qty 1

## 2012-01-25 MED ORDER — LEVALBUTEROL TARTRATE 45 MCG/ACT IN AERO
1.0000 | INHALATION_SPRAY | Freq: Four times a day (QID) | RESPIRATORY_TRACT | Status: DC
  Filled 2012-01-25: qty 15

## 2012-01-25 MED ORDER — LEVOFLOXACIN 500 MG PO TABS
500.0000 mg | ORAL_TABLET | Freq: Once | ORAL | Status: DC
  Filled 2012-01-25: qty 1

## 2012-01-25 MED ORDER — LEVOFLOXACIN IN D5W 500 MG/100ML IV SOLN
500.0000 mg | Freq: Once | INTRAVENOUS | Status: AC
  Administered 2012-01-25: 500 mg via INTRAVENOUS
  Filled 2012-01-25: qty 100

## 2012-01-25 MED ORDER — ALPRAZOLAM 1 MG PO TABS
1.0000 mg | ORAL_TABLET | Freq: Every day | ORAL | Status: DC
  Administered 2012-01-26 – 2012-02-02 (×8): 1 mg via ORAL
  Filled 2012-01-25 (×9): qty 1

## 2012-01-25 MED ORDER — HYDROMORPHONE HCL PF 1 MG/ML IJ SOLN
INTRAMUSCULAR | Status: AC
  Filled 2012-01-25: qty 1

## 2012-01-25 MED ORDER — LEVALBUTEROL TARTRATE 45 MCG/ACT IN AERO
2.0000 | INHALATION_SPRAY | Freq: Four times a day (QID) | RESPIRATORY_TRACT | Status: DC
  Administered 2012-01-25 – 2012-01-26 (×2): 2 via RESPIRATORY_TRACT
  Filled 2012-01-25: qty 15

## 2012-01-25 MED ORDER — PANTOPRAZOLE SODIUM 40 MG PO TBEC
40.0000 mg | DELAYED_RELEASE_TABLET | Freq: Every day | ORAL | Status: DC
  Administered 2012-01-25 – 2012-02-02 (×9): 40 mg via ORAL
  Filled 2012-01-25 (×9): qty 1

## 2012-01-25 MED ORDER — ISOSORB DINITRATE-HYDRALAZINE 20-37.5 MG PO TABS
1.0000 | ORAL_TABLET | Freq: Three times a day (TID) | ORAL | Status: DC
  Administered 2012-01-25 – 2012-02-02 (×21): 1 via ORAL
  Filled 2012-01-25 (×26): qty 1

## 2012-01-25 MED ORDER — GUAIFENESIN ER 600 MG PO TB12
600.0000 mg | ORAL_TABLET | Freq: Two times a day (BID) | ORAL | Status: DC | PRN
  Administered 2012-01-26: 600 mg via ORAL
  Filled 2012-01-25 (×2): qty 2

## 2012-01-25 MED ORDER — SIMVASTATIN 20 MG PO TABS
20.0000 mg | ORAL_TABLET | Freq: Every day | ORAL | Status: DC
  Administered 2012-01-25 – 2012-02-02 (×9): 20 mg via ORAL
  Filled 2012-01-25 (×9): qty 1

## 2012-01-25 MED ORDER — ENSURE COMPLETE PO LIQD
237.0000 mL | Freq: Three times a day (TID) | ORAL | Status: DC
  Administered 2012-01-25 – 2012-02-02 (×14): 237 mL via ORAL

## 2012-01-25 MED ORDER — LEVOFLOXACIN IN D5W 500 MG/100ML IV SOLN: 500.0000 mg | Freq: Once | INTRAVENOUS | Status: DC

## 2012-01-25 MED ORDER — IOHEXOL 300 MG/ML  SOLN
80.0000 mL | Freq: Once | INTRAMUSCULAR | Status: AC | PRN
  Administered 2012-01-25: 80 mL via INTRAVENOUS

## 2012-01-25 MED ORDER — POLYETHYLENE GLYCOL 3350 17 G PO PACK
17.0000 g | PACK | Freq: Every day | ORAL | Status: DC
  Administered 2012-01-25 – 2012-02-02 (×9): 17 g via ORAL
  Filled 2012-01-25 (×9): qty 1

## 2012-01-25 MED ORDER — LORATADINE 10 MG PO TABS
10.0000 mg | ORAL_TABLET | Freq: Every day | ORAL | Status: DC
  Administered 2012-01-25 – 2012-02-02 (×9): 10 mg via ORAL
  Filled 2012-01-25 (×9): qty 1

## 2012-01-25 MED ORDER — RANOLAZINE ER 500 MG PO TB12
500.0000 mg | ORAL_TABLET | Freq: Every day | ORAL | Status: DC
  Administered 2012-01-25 – 2012-02-02 (×9): 500 mg via ORAL
  Filled 2012-01-25 (×9): qty 1

## 2012-01-25 MED ORDER — NEBIVOLOL HCL 2.5 MG PO TABS
2.5000 mg | ORAL_TABLET | Freq: Every day | ORAL | Status: DC
  Administered 2012-01-25 – 2012-02-02 (×8): 2.5 mg via ORAL
  Filled 2012-01-25 (×10): qty 1

## 2012-01-25 MED ORDER — ASPIRIN 81 MG PO CHEW
81.0000 mg | CHEWABLE_TABLET | Freq: Every day | ORAL | Status: DC
  Administered 2012-01-25 – 2012-02-02 (×9): 81 mg via ORAL
  Filled 2012-01-25 (×9): qty 1

## 2012-01-25 MED ORDER — FUROSEMIDE 80 MG PO TABS
80.0000 mg | ORAL_TABLET | Freq: Every day | ORAL | Status: DC
  Administered 2012-01-26: 80 mg via ORAL
  Filled 2012-01-25: qty 1

## 2012-01-25 MED ORDER — LEVOFLOXACIN 250 MG PO TABS
250.0000 mg | ORAL_TABLET | ORAL | Status: DC
  Administered 2012-01-26 – 2012-02-02 (×8): 250 mg via ORAL
  Filled 2012-01-25 (×9): qty 1

## 2012-01-25 MED ORDER — METOLAZONE 5 MG PO TABS
5.0000 mg | ORAL_TABLET | Freq: Every day | ORAL | Status: DC
  Administered 2012-01-25 – 2012-01-26 (×2): 5 mg via ORAL
  Filled 2012-01-25 (×2): qty 1

## 2012-01-25 MED ORDER — SODIUM CHLORIDE 0.9 % IV SOLN
INTRAVENOUS | Status: AC
  Administered 2012-01-25: 18:00:00 via INTRAVENOUS

## 2012-01-25 MED ORDER — SENNOSIDES-DOCUSATE SODIUM 8.6-50 MG PO TABS
3.0000 | ORAL_TABLET | Freq: Two times a day (BID) | ORAL | Status: DC
  Administered 2012-01-25 – 2012-01-29 (×9): 3 via ORAL
  Administered 2012-01-30: 2 via ORAL
  Administered 2012-01-30 – 2012-02-02 (×5): 3 via ORAL
  Filled 2012-01-25 (×17): qty 3

## 2012-01-25 MED ORDER — BENZONATATE 100 MG PO CAPS
200.0000 mg | ORAL_CAPSULE | Freq: Three times a day (TID) | ORAL | Status: DC | PRN
  Administered 2012-01-26: 200 mg via ORAL
  Filled 2012-01-25: qty 2

## 2012-01-25 MED ORDER — ONDANSETRON HCL 4 MG/2ML IJ SOLN: 4.0000 mg | Freq: Four times a day (QID) | INTRAMUSCULAR | Status: DC | PRN

## 2012-01-25 MED ORDER — NITROGLYCERIN 0.4 MG SL SUBL: 0.4000 mg | SUBLINGUAL_TABLET | SUBLINGUAL | Status: DC | PRN

## 2012-01-25 NOTE — H&P (Signed)
[facility] History and Physical  Emrick Hensch BJY:[id number] DOB: 18-Oct-1926 DOA: 01/25/2012  Referring physician:  PCP: Kimber Relic, MD  Specialists:  Chief Complaint:   HPI: Dustin [name] is a 78 y.o. male  He is a caucasian man with h/o ICM, chronic systolic CHF, LBBB, s/p BiV ICD implant. He has h/o  repair of abdominal aortic aneurysm. He developed urinary retention last night and had a Foley catheter inserted. Then, he developed severe lower abdominal, testicular, and penile pain. No other systemic symptoms noted. His pain is better since here. He was hypoxic without oxygen in ED. BIPAP was started and he improved. CT AP showed left epididymitis. Started on Levofloxacin. Daughter mentions that he was hard to manage at home due to her mother's dementia and prefers placement for him. Patient has been getting weaker and unable to carry his own tasks.  Review of Systems: The patient denies anorexia, fever, weight loss,, vision loss, decreased hearing, hoarseness, chest pain, syncope, dyspnea on exertion, peripheral edema, balance deficits, hemoptysis, abdominal pain, melena, hematochezia, severe indigestion/heartburn, hematuria,  genital sores, muscle weakness, suspicious skin lesions, transient blindness, depression, unusual weight change, abnormal bleeding, enlarged lymph nodes, angioedema, and breast masses.    Past Medical History  Diagnosis Date  . IHD (ischemic heart disease)      -  non-ST elevation MI in July 2009. and NSTEMI Jan 2010 in setting of RLL CAP. 99% diagonal  disease s/p stent July of 2009. Cath Jan 2010 showed  occulusion of SVG to RCA   LVEF 45%. rec med rx   - Echo 10/29/08 EF 25%  . PAD (peripheral artery disease)   . Hyperlipidemia   . Hypertension   . Obstructive sleep apnea      Refuses to wear his CPAP.  Not on oxygen   . Vocal cord dysfunction     with Botox injection at [facility] around 2005  per patient.     - Patient's  swallowing difficulties may be related to vocal    cord dysfunction, recommended to be followed up at [facility]  . Stroke     History of left brain cerebrovascular accident in 1998 without   . Dysphagia       -swallowing study nl oropharyngeal swallow, brief pause  at top of the esophagus which clears with liquid wash or small bites.    Rec  regular diet with thin liquids and continue management of    esophageal dysmotility.   - Barium swallow 10/31/08  mild dilation upper esosphagus, moderate slowing, can't r/o mass  . Anemia         03/21/2008 hgb 10.9, MCV 86, Stool occult blood postiive -> On IV Iron   . COPD (chronic obstructive pulmonary disease)     - Hypoxemia, former smoker-  FEV1 1.86 (72%) , ratio 68% (07/26/08) - FEV1 1.61 (62%) , ratio 74    ( 10/29/08) with no insp or exp truncation on f/v loop   Past Surgical History  Procedure Date  . Coronary artery bypass graft   . Abdominal aortic aneurysm repair   . Aortobifemoral bypass graft in the past   . Icd insertion     ICD [employer]onic   Social History:  reports that he quit smoking about 31 years ago. His smoking use included Cigarettes. He has a 7.5 pack-year smoking history. His smokeless tobacco use includes Chew. He reports that he drinks alcohol. He reports that he does not use  illicit drugs.  Allergies  Allergen Reactions  . Codeine     REACTION: "makes me crazy"  . Inderal (Propranolol) Other (See Comments)    unknown  . Sulfamethoxazole W-Trimethoprim     Family History  Problem Relation Age of Onset  . COPD Sister   . Heart disease Sister   . Alzheimer's disease Sister   . Lung cancer Sister   . Heart disease Brother   . Alzheimer's disease Brother   . Alzheimer's disease Mother     Prior to Admission medications   Medication Sig Start Date End Date Taking? Authorizing Provider  allopurinol (ZYLOPRIM) 300 MG tablet Take 300 mg by mouth every evening.    Yes Historical Provider, MD  ALPRAZolam Prudy Feeler) 1 MG  tablet Take 1 mg by mouth at bedtime.    Yes Historical Provider, MD  aspirin 81 MG tablet Take 81 mg by mouth every morning.    Yes Historical Provider, MD  benzonatate (TESSALON) 200 MG capsule Take 200 mg by mouth 3 (three) times daily as needed. For cough   Yes Historical Provider, MD  cetirizine (ZYRTEC) 10 MG chewable tablet Chew 10 mg by mouth every morning.    Yes Historical Provider, MD  cholecalciferol (VITAMIN D) 1000 UNITS tablet Take 2,000 Units by mouth every evening.   Yes Historical Provider, MD  diphenhydrAMINE (BENADRYL) 25 MG tablet Take 50 mg by mouth at bedtime.   Yes Historical Provider, MD  furosemide (LASIX) 80 MG tablet Take 80 mg by mouth every morning.    Yes Historical Provider, MD  glipiZIDE (GLUCOTROL) 5 MG tablet Take 5 mg by mouth every morning.    Yes Historical Provider, MD  guaiFENesin (MUCINEX) 600 MG 12 hr tablet Take 600-1,200 mg by mouth every 12 (twelve) hours as needed. For cough   Yes Historical Provider, MD  iron polysaccharides (NIFEREX) 150 MG capsule Take 150 mg by mouth every morning.    Yes Historical Provider, MD  isosorbide-hydrALAZINE (BIDIL) 20-37.5 MG per tablet Take 1 tablet by mouth 3 (three) times daily.    Yes Historical Provider, MD  levalbuterol [facility] HFA) 45 MCG/ACT inhaler Inhale 1-2 puffs into the lungs every 4 (four) hours as needed. For shortness of breath   Yes Historical Provider, MD  metolazone (ZAROXOLYN) 5 MG tablet Take 5 mg by mouth daily as needed. For fluid retention of 2 pounds   Yes Historical Provider, MD  nebivolol (BYSTOLIC) 5 MG tablet Take 2.5 mg by mouth every morning. Take 1/2 (half) tab qd   Yes Historical Provider, MD  nitroGLYCERIN (NITROSTAT) 0.4 MG SL tablet Place 0.4 mg under the tongue every 5 (five) minutes x 3 doses as needed. For chest pain   Yes Historical Provider, MD  NON FORMULARY OXYGEN  As directed   Yes Historical Provider, MD  omeprazole (PRILOSEC) 20 MG capsule Take 20-40 mg by mouth 2 (two)  times daily. Take 2 capsules in the morning and 1 capsule in the evenings   Yes Historical Provider, MD  Oxycodone HCl 10 MG TABS Take 10 mg by mouth 3 (three) times daily.   Yes Historical Provider, MD  pravastatin (PRAVACHOL) 40 MG tablet Take 40 mg by mouth every evening.    Yes Historical Provider, MD  ranolazine (RANEXA) 500 MG 12 hr tablet Take 500 mg by mouth every morning.   Yes Historical Provider, MD  senna-docusate (SENOKOT-S) 8.6-50 MG per tablet Take 3 tablets by mouth 2 (two) times daily.   Yes Historical Provider,  MD  sertraline (ZOLOFT) 100 MG tablet Take 100 mg by mouth every morning.    Yes Historical Provider, MD   Physical Exam: Filed Vitals:   01/25/12 1500 01/25/12 1515 01/25/12 1530 01/25/12 1545  BP: 137/65 137/63 130/60 132/71  Pulse: 62 60 63 66  Temp:    98.2 F (36.8 C)  TempSrc:    Oral  Resp:    18  Height:    5\' 9"  (1.753 m)  Weight:    77.701 kg (171 lb 4.8 oz)  SpO2: 96% 93% 97% 94%     General:  A, O x 3, NAD, using oxygen by [place]  Eyes: PEERLA, EOMI  ENT: OMM, no thrush  Neck: No LN or JVD  Cardiovascular: S1S2 reg, has murmur  Respiratory: CTAB, no w/r/c  Abdomen: has suprapubic tenderness, non distended, BS+  Skin: no rash or bruises  Musculoskeletal: FROM  Psychiatric: no depression  Neurologic: No FND  Labs on Admission:  Basic Metabolic Panel:  Lab 01/25/12 2130  NA 136  K 3.5  CL 92*  CO2 36*  GLUCOSE 107*  BUN 34*  CREATININE 1.57*  CALCIUM 9.8  MG --  PHOS --   Liver Function Tests:  Lab 01/25/12 0745  AST 25  ALT 14  ALKPHOS 117  BILITOT 0.6  PROT 6.7  ALBUMIN 3.5   No results found for this basename: LIPASE:5,AMYLASE:5 in the last 168 hours No results found for this basename: AMMONIA:5 in the last 168 hours CBC:  Lab 01/25/12 0745  WBC 8.8  NEUTROABS 6.3  HGB 11.7*  HCT 35.8*  MCV 95.5  PLT 178   Cardiac Enzymes: No results found for this basename: CKTOTAL:5,CKMB:5,CKMBINDEX:5,TROPONINI:5 in  the last 168 hours  BNP (last 3 results) No results found for this basename: PROBNP:3 in the last 8760 hours CBG: No results found for this basename: GLUCAP:5 in the last 168 hours  Radiological Exams on Admission: US Scrotum  01/25/2012  *RADIOLOGY REPORT*  Scrotal ultrasound; scrotal Doppler ultrasound  History:  Left-sided scrotal pain  Findings:  Real-time and Doppler interrogation of the scrotal contents was performed.  Both testes are normal in size and contour.  There is no intratesticular mass.  Color flow is seen in both testes.  Both testes demonstrate low resistance wave forms.  The peak systolic velocity in the right testis is  4.2 cm/sec with an end- diastolic velocity of 1.3 cm/sec.  The peak systolic velocity in the left testis is 4.8 cm/sec an end diastolic velocity of 1.4 cm/sec.  Venous outflow is demonstrated bilaterally.  The left epididymis appears somewhat edematous and hyperemic.  The right epididymis appears normal. There are no extratesticular masses on either side.  There is a small hydrocele on the left.  There is no appreciable hydrocele on the right.  There is no scrotal abscess or wall thickening.  There are small varicoceles bilaterally.  Conclusion:  Evidence of left epididymitis.  Small varicoceles. Small left hydrocele.  No intratesticular mass or torsion.   Original Report Authenticated By: Bretta Bang, M.D.    Ct Abdomen Pelvis W Contrast  01/25/2012  *RADIOLOGY REPORT*  Clinical Data: Abdominal pain, hematuria, recent AAA repair  CT ABDOMEN AND PELVIS WITH CONTRAST  Technique:  Multidetector CT imaging of the abdomen and pelvis was performed following the standard protocol during bolus administration of intravenous contrast.  Contrast: 80mL OMNIPAQUE IOHEXOL 300 MG/ML  SOLN the  Comparison: 11/06/2011  Findings: Cardiomegaly again noted.  Cardiac pacemaker leads are  partially visualized.  Bilateral trace pleural effusion with bilateral basilar posterior  atelectasis.  Trace perisplenic ascites.  No calcified gallstones are noted within gallbladder.  Enhanced liver is unremarkable.  Partially atrophic pancreas again noted. The adrenal glands are unremarkable.  Kidneys are symmetrical in size and enhancement.  No hydronephrosis or hydroureter.  Extensive atherosclerotic calcifications SMA origin, celiac trunk origin, splenic artery abdominal aorta and the iliac arteries again noted. Stable postsurgical changes distal abdominal aorta.  No small bowel obstruction.  No ascites or free air.  No adenopathy.  Stable postsurgical changes bilateral common iliac artery.  Left colon diverticula are noted without evidence of acute diverticulitis.  Multiple sigmoid colon diverticula are noted without evidence of acute diverticulitis.  Again noted right inguinal hernia containing fat and short segment of small bowel without evidence for acute complication.  There is a Foley catheter within urinary bladder.  The urinary bladder is decompressed. Small amount of air in anterior aspect urinary bladder is probable post instrumentation.  Mild enlarged prostate gland.  Sagittal images of the spine shows stable osteopenia and mild degenerative changes.  Small left inguinal hernia containing fat without evidence of acute complication.  No destructive bony lesions are noted within pelvis. No pericecal inflammation.  Normal appendix is clearly visualized.  Delayed renal images shows bilateral renal symmetrical excretion.  IMPRESSION: 1.  Stable postsurgical changes distal abdominal aorta and bilateral common iliac artery. 2.  No small bowel obstruction. 3.  Bilateral small pleural effusion with bilateral basilar posterior atelectasis. 4.  Again noted extensive atherosclerotic vascular calcifications. 5.  Normal appendix.  No pericecal inflammation. 6.  Left colon and sigmoid colon diverticula are noted without evidence of acute diverticulitis. 7.  There is a Foley catheter within  decompressed urinary bladder. Mild enlarged prostate gland. 8.  Again noted small right inguinal hernia containing fat and small bowel without evidence of acute complication.   Original Report Authenticated By: Natasha Mead, M.D.    [place] Art/ven Flow Abd Pelv Doppler  01/25/2012  *RADIOLOGY REPORT*  Scrotal ultrasound and scrotal Doppler ultrasound reports are combined into a single dictation.   Original Report Authenticated By: Bretta Bang, M.D.     Assessment/Plan Active Problems:  CARDIOMYOPATHY, ISCHEMIC  Atrial fibrillation  Epididymitis, left  Generalized weakness  1. Left sided epididymitis 2. Generalized weakness 3. Afib 4. Cardiomyopathy  He will continue oral Levofloxacin for #1. Follow clinically.  We will get PT/OT/nutrition eval. He may need Inpatient hospice. He will be DNR/DNI. Cont home meds.  We will follow if he continues to have bleeding with foley cath. He may need cystoscopy to assess damage to urethra (or tear or fistula formation from traumatic insertion).   Code Status: DNR/DNI Family Communication: D/w wife and daughter. Daughter recommends that we talk to her for any decisions due to mother's dementia. Inpatient hospice was discussed today.  Disposition Plan: Inpatient hospice.   Time spent: 1 hours Sharry Beining V. [facility] Pager [phone].  If 7PM-7AM, please contact night-coverage [email or web address] Password [facility] 01/25/2012, 4:54 PM

## 2012-01-25 NOTE — ED Provider Notes (Addendum)
78 year old male status post repair of abdominal aortic aneurysm an episode of urinary retention last night and had a Foley catheter inserted. Following that, he developed severe lower abdominal, testicular, and penile pain. On exam, he has moderate tenderness across his lower abdomen, and severe tenderness of testicles there is no testicular swelling or mass. Femoral pulses are strong. Foley catheter is in place although there is some blood indicating some trouble with insertion. CT scan will be obtained and if no adequate explanation for his pain is found, testicular ultrasound will be obtained.  Medical screening examination/treatment/procedure(s) were conducted as a shared visit with non-physician practitioner(s) and myself.  I personally evaluated the patient during the encounter   Dione Booze, MD 01/25/12 1610  Dione Booze, MD 01/28/12 [phone]

## 2012-01-25 NOTE — ED Notes (Signed)
Urine was not a clean catch it was collected through foley cathater

## 2012-01-25 NOTE — ED Notes (Signed)
US at bedside

## 2012-01-25 NOTE — ED Notes (Signed)
As per EMS pt c/o abd pain and blood in urine. Pt is under hospice care for [facility] w/Hx CHF,COPD

## 2012-01-25 NOTE — Progress Notes (Signed)
Room [facility] ED #15 - Mick Koslow - [facility] RN Visit-R.Carol Loftin RN  Related admission to [facility] diagnosis of CHF.   Pt is DNR code with OOF DNR on shadow chart.    Pt arousable, lying on ed stretcher, with complaints of abdomen pain.Pt currently attempting to drink contrast for CT abdomen/pelvis. Dtr and wife  present.   Pt currently lives in Independent Living and due to progressive weakness over the past 3 days, family unable to care for pt at home.  [facility] SW contacted to discuss with family.    Please call [facility] @ [phone]- ask for RN Liaison or after hours,ask for on-call RN with any hospice needs.    Thank you.  [name], RN  [facility]  Hospice Liaison

## 2012-01-25 NOTE — ED Provider Notes (Signed)
History     CSN: [id number]  Arrival date & time 01/25/12  8295   First MD Initiated Contact with Patient 01/25/12 [phone]      Chief Complaint  Patient presents with  . Abdominal Pain    (Consider location/radiation/quality/duration/timing/severity/associated sxs/prior treatment) HPI  He is a pleasant elderly man with an ICM, chronic systolic CHF, LBBB, s/p BiV ICD implant.   78 year old male status post repair of abdominal aortic aneurysm had an episode of urinary retention last night and had a Foley catheter inserted. Following that, he developed severe lower abdominal, testicular, and penile pain. He denies having any systemic symptoms of fevers, chills, N/V/D, or worsened weakness. He is uncomfortable and does not sit still in bed.  Past Medical History  Diagnosis Date  . IHD (ischemic heart disease)      -  non-ST elevation MI in July 2009. and NSTEMI Jan 2010 in setting of RLL CAP. 99% diagonal  disease s/p stent July of 2009. Cath Jan 2010 showed  occulusion of SVG to RCA   LVEF 45%. rec med rx   - Echo 10/29/08 EF 25%  . PAD (peripheral artery disease)   . Hyperlipidemia   . Hypertension   . Obstructive sleep apnea      Refuses to wear his CPAP.  Not on oxygen   . Vocal cord dysfunction     with Botox injection at [facility] around 2005  per patient.     - Patient's swallowing difficulties may be related to vocal    cord dysfunction, recommended to be followed up at [facility]  . Stroke     History of left brain cerebrovascular accident in 1998 without   . Dysphagia       -swallowing study nl oropharyngeal swallow, brief pause  at top of the esophagus which clears with liquid wash or small bites.    Rec  regular diet with thin liquids and continue management of    esophageal dysmotility.   - Barium swallow 10/31/08  mild dilation upper esosphagus, moderate slowing, can't r/o mass  . Anemia         03/21/2008 hgb 10.9, MCV 86, Stool occult blood  postiive -> On IV Iron   . COPD (chronic obstructive pulmonary disease)     - Hypoxemia, former smoker-  FEV1 1.86 (72%) , ratio 68% (07/26/08) - FEV1 1.61 (62%) , ratio 74    ( 10/29/08) with no insp or exp truncation on f/v loop    Past Surgical History  Procedure Date  . Coronary artery bypass graft   . Abdominal aortic aneurysm repair   . Aortobifemoral bypass graft in the past   . Icd insertion     ICD Medtronic    Family History  Problem Relation Age of Onset  . COPD Sister   . Heart disease Sister   . Alzheimer's disease Sister   . Lung cancer Sister   . Heart disease Brother   . Alzheimer's disease Brother     History  Substance Use Topics  . Smoking status: Former Smoker -- 0.5 packs/day for 15 years    Quit date: 02/23/1980  . Smokeless tobacco: Not on file  . Alcohol Use: Yes      Review of Systems  Review of Systems  Gen: no weight loss, fevers, chills, night sweats  Neck: no neck pain  Lungs:No wheezing, coughing or hemoptysis CV: no chest pain, palpitations, dependent edema or orthopnea  Abd: +  abdominal pain, no nausea, vomiting  GU: no dysuria, + hematuria, penile pain, foley catheter in place MSK:  No abnormalities  Neuro: no headache, no focal neurologic deficits  Skin: no abnormalities Psyche: negative.   Allergies  Codeine and Sulfamethoxazole w-trimethoprim  Home Medications   Current Outpatient Rx  Name  Route  Sig  Dispense  Refill  . ALLOPURINOL 300 MG PO TABS   Oral   Take 300 mg by mouth every evening.          Marland Kitchen ALPRAZOLAM 1 MG PO TABS   Oral   Take 1 mg by mouth at bedtime.          . ASPIRIN 81 MG PO TABS   Oral   Take 81 mg by mouth every morning.          Marland Kitchen BENZONATATE 200 MG PO CAPS   Oral   Take 200 mg by mouth 3 (three) times daily as needed. For cough         . CETIRIZINE HCL 10 MG PO CHEW   Oral   Chew 10 mg by mouth every morning.          Marland Kitchen VITAMIN D 1000 UNITS PO TABS   Oral   Take 2,000 Units  by mouth every evening.         Marland Kitchen DIPHENHYDRAMINE HCL 25 MG PO TABS   Oral   Take 50 mg by mouth at bedtime.         . FUROSEMIDE 80 MG PO TABS   Oral   Take 80 mg by mouth every morning.          Marland Kitchen GLIPIZIDE 5 MG PO TABS   Oral   Take 5 mg by mouth every morning.          . GUAIFENESIN ER 600 MG PO TB12   Oral   Take 600-1,200 mg by mouth every 12 (twelve) hours as needed. For cough         . POLYSACCHARIDE IRON COMPLEX 150 MG PO CAPS   Oral   Take 150 mg by mouth every morning.          . ISOSORB DINITRATE-HYDRALAZINE 20-37.5 MG PO TABS   Oral   Take 1 tablet by mouth 3 (three) times daily.          Marland Kitchen LEVALBUTEROL TARTRATE 45 MCG/ACT IN AERO   Inhalation   Inhale 1-2 puffs into the lungs every 4 (four) hours as needed. For shortness of breath         . METOLAZONE 5 MG PO TABS   Oral   Take 5 mg by mouth daily as needed. For fluid retention of 2 pounds         . NEBIVOLOL HCL 5 MG PO TABS   Oral   Take 2.5 mg by mouth every morning. Take 1/2 (half) tab qd         . NITROGLYCERIN 0.4 MG SL SUBL   Sublingual   Place 0.4 mg under the tongue every 5 (five) minutes x 3 doses as needed. For chest pain         . NON FORMULARY      OXYGEN  As directed         . OMEPRAZOLE 20 MG PO CPDR   Oral   Take 20-40 mg by mouth 2 (two) times daily. Take 2 capsules in the morning and 1 capsule in the evenings         .  OXYCODONE HCL 10 MG PO TABS   Oral   Take 10 mg by mouth 3 (three) times daily.         Marland Kitchen PRAVASTATIN SODIUM 40 MG PO TABS   Oral   Take 40 mg by mouth every evening.          Marland Kitchen RANOLAZINE ER 500 MG PO TB12   Oral   Take 500 mg by mouth every morning.         Bernadette Hoit SODIUM 8.6-50 MG PO TABS   Oral   Take 3 tablets by mouth 2 (two) times daily.         . SERTRALINE HCL 100 MG PO TABS   Oral   Take 100 mg by mouth every morning.            BP 119/60  Pulse 98  Temp 97.7 F (36.5 C) (Oral)  Resp  16  SpO2 95%  Physical Exam  Nursing note and vitals reviewed. Constitutional: He appears well-developed and well-nourished. No distress.  HENT:  Head: Normocephalic and atraumatic.  Eyes: Pupils are equal, round, and reactive to light.  Neck: Normal range of motion. Neck supple.  Cardiovascular: Normal rate and regular rhythm.        Femoral pulses are strong  Pulmonary/Chest: Effort normal.  Abdominal: Soft. There is tenderness (bilateral lower quadrants). There is no rebound and no guarding.  Genitourinary: Right testis shows swelling and tenderness. Right testis shows no mass. Left testis shows tenderness. Left testis shows no mass and no swelling.       Foley catheter in place with blood around wound edges.   Neurological: He is alert.  Skin: Skin is warm and dry.    ED Course  Procedures (including critical care time)  Labs Reviewed  CBC WITH DIFFERENTIAL - Abnormal; Notable for the following:    RBC 3.75 (*)     Hemoglobin 11.7 (*)     HCT 35.8 (*)     All other components within normal limits  COMPREHENSIVE METABOLIC PANEL - Abnormal; Notable for the following:    Chloride 92 (*)     CO2 36 (*)     Glucose, Bld 107 (*)     BUN 34 (*)     Creatinine, Ser 1.57 (*)     GFR calc non Af Amer 39 (*)     GFR calc Af Amer 45 (*)     All other components within normal limits  URINALYSIS, MICROSCOPIC ONLY - Abnormal; Notable for the following:    Hgb urine dipstick LARGE (*)     Protein, ur 30 (*)     Leukocytes, UA MODERATE (*)     Bacteria, UA FEW (*)     All other components within normal limits  URINE CULTURE   US Scrotum  01/25/2012  *RADIOLOGY REPORT*  Scrotal ultrasound; scrotal Doppler ultrasound  History:  Left-sided scrotal pain  Findings:  Real-time and Doppler interrogation of the scrotal contents was performed.  Both testes are normal in size and contour.  There is no intratesticular mass.  Color flow is seen in both testes.  Both testes demonstrate low  resistance wave forms.  The peak systolic velocity in the right testis is  4.2 cm/sec with an end- diastolic velocity of 1.3 cm/sec.  The peak systolic velocity in the left testis is 4.8 cm/sec an end diastolic velocity of 1.4 cm/sec.  Venous outflow is demonstrated bilaterally.  The left epididymis appears somewhat edematous and  hyperemic.  The right epididymis appears normal. There are no extratesticular masses on either side.  There is a small hydrocele on the left.  There is no appreciable hydrocele on the right.  There is no scrotal abscess or wall thickening.  There are small varicoceles bilaterally.  Conclusion:  Evidence of left epididymitis.  Small varicoceles. Small left hydrocele.  No intratesticular mass or torsion.   Original Report Authenticated By: Bretta Bang, M.D.    Ct Abdomen Pelvis W Contrast  01/25/2012  *RADIOLOGY REPORT*  Clinical Data: Abdominal pain, hematuria, recent AAA repair  CT ABDOMEN AND PELVIS WITH CONTRAST  Technique:  Multidetector CT imaging of the abdomen and pelvis was performed following the standard protocol during bolus administration of intravenous contrast.  Contrast: 80mL OMNIPAQUE IOHEXOL 300 MG/ML  SOLN the  Comparison: 11/06/2011  Findings: Cardiomegaly again noted.  Cardiac pacemaker leads are partially visualized.  Bilateral trace pleural effusion with bilateral basilar posterior atelectasis.  Trace perisplenic ascites.  No calcified gallstones are noted within gallbladder.  Enhanced liver is unremarkable.  Partially atrophic pancreas again noted. The adrenal glands are unremarkable.  Kidneys are symmetrical in size and enhancement.  No hydronephrosis or hydroureter.  Extensive atherosclerotic calcifications SMA origin, celiac trunk origin, splenic artery abdominal aorta and the iliac arteries again noted. Stable postsurgical changes distal abdominal aorta.  No small bowel obstruction.  No ascites or free air.  No adenopathy.  Stable postsurgical changes  bilateral common iliac artery.  Left colon diverticula are noted without evidence of acute diverticulitis.  Multiple sigmoid colon diverticula are noted without evidence of acute diverticulitis.  Again noted right inguinal hernia containing fat and short segment of small bowel without evidence for acute complication.  There is a Foley catheter within urinary bladder.  The urinary bladder is decompressed. Small amount of air in anterior aspect urinary bladder is probable post instrumentation.  Mild enlarged prostate gland.  Sagittal images of the spine shows stable osteopenia and mild degenerative changes.  Small left inguinal hernia containing fat without evidence of acute complication.  No destructive bony lesions are noted within pelvis. No pericecal inflammation.  Normal appendix is clearly visualized.  Delayed renal images shows bilateral renal symmetrical excretion.  IMPRESSION: 1.  Stable postsurgical changes distal abdominal aorta and bilateral common iliac artery. 2.  No small bowel obstruction. 3.  Bilateral small pleural effusion with bilateral basilar posterior atelectasis. 4.  Again noted extensive atherosclerotic vascular calcifications. 5.  Normal appendix.  No pericecal inflammation. 6.  Left colon and sigmoid colon diverticula are noted without evidence of acute diverticulitis. 7.  There is a Foley catheter within decompressed urinary bladder. Mild enlarged prostate gland. 8.  Again noted small right inguinal hernia containing fat and small bowel without evidence of acute complication.   Original Report Authenticated By: Natasha Mead, M.D.    [place] Art/ven Flow Abd Pelv Doppler  01/25/2012  *RADIOLOGY REPORT*  Scrotal ultrasound and scrotal Doppler ultrasound reports are combined into a single dictation.   Original Report Authenticated By: Bretta Bang, M.D.      1. Epididymitis, left   2. Generalized weakness       MDM  Discussed case with Dr. Preston Fleeting. Unsure of etiology of pain at this  time. 1mg  IV Dilaudid given. Pt says pain is still 10/10. However, his O2 sats dropped to low 80's and he became lethargic. Bipap started and his O2 came back up to 100%. CT scan abd/pelv ordered with contrast to evaluate. If no cause for  pain noted, will order ultrasound of testicles.    1:30pm- pt positive epididymitis. Just prior to discharge the patients daughter tells me that she can not take him home. He lives with his wife and she has quickly worsening dementia and cant help him. He is so weak that he is unable to get out of bed. He "keeps peeing all over himself" and Lavenia Atlas been there helping since Sunday but I have my own emotional and physical problems to take care of and I can't stay there to help all the time. She also informs me that she is not strong enough to lift him the weaker he gets.    1:51pm- Patient admitted to Dr. Lind Guest, [facility], inpatient, Team 1, med-surg   Dorthula Matas, [place] 01/25/12 1351

## 2012-01-26 DIAGNOSIS — N183 Chronic kidney disease, stage 3 unspecified: Secondary | ICD-10-CM

## 2012-01-26 DIAGNOSIS — G9340 Encephalopathy, unspecified: Secondary | ICD-10-CM

## 2012-01-26 LAB — COMPREHENSIVE METABOLIC PANEL
ALT: 14 U/L (ref 0–53)
AST: 24 U/L (ref 0–37)
Albumin: 3.3 g/dL — ABNORMAL LOW (ref 3.5–5.2)
Chloride: 95 mEq/L — ABNORMAL LOW (ref 96–112)
Creatinine, Ser: 1.54 mg/dL — ABNORMAL HIGH (ref 0.50–1.35)
Potassium: 3.8 mEq/L (ref 3.5–5.1)
Sodium: 137 mEq/L (ref 135–145)
Total Bilirubin: 0.5 mg/dL (ref 0.3–1.2)

## 2012-01-26 LAB — CBC WITH DIFFERENTIAL/PLATELET
Basophils Absolute: 0 10*3/uL (ref 0.0–0.1)
Basophils Relative: 0 % (ref 0–1)
Lymphocytes Relative: 19 % (ref 12–46)
MCHC: 30.7 g/dL (ref 30.0–36.0)
Monocytes Absolute: 0.6 10*3/uL (ref 0.1–1.0)
Neutro Abs: 4.7 10*3/uL (ref 1.7–7.7)
Neutrophils Relative %: 70 % (ref 43–77)
Platelets: 181 10*3/uL (ref 150–400)
RDW: 15.8 % — ABNORMAL HIGH (ref 11.5–15.5)
WBC: 6.7 10*3/uL (ref 4.0–10.5)

## 2012-01-26 LAB — URINE CULTURE: Colony Count: NO GROWTH

## 2012-01-26 MED ORDER — LEVALBUTEROL TARTRATE 45 MCG/ACT IN AERO
2.0000 | INHALATION_SPRAY | Freq: Two times a day (BID) | RESPIRATORY_TRACT | Status: DC
  Administered 2012-01-26 – 2012-02-01 (×10): 2 via RESPIRATORY_TRACT

## 2012-01-26 MED ORDER — MORPHINE SULFATE 4 MG/ML IJ SOLN
4.0000 mg | INTRAMUSCULAR | Status: DC | PRN
  Administered 2012-01-26 – 2012-01-28 (×4): 4 mg via INTRAVENOUS
  Administered 2012-01-29: 2 mg via INTRAVENOUS
  Filled 2012-01-26 (×9): qty 1

## 2012-01-26 NOTE — Progress Notes (Addendum)
[facility] PROGRESS NOTE  Dustin [name] ZOX:[id number] DOB: 09-Feb-1927 DOA: 01/25/2012 PCP: Kimber Relic, MD   Brief history: 78 year old gentleman who began having confusion on Saturday night into Sunday morning. The patient currently receives home hospice at his ALF.  Hospice noted increased 7 pounds gain since July and patient was given extra doses of furosemide on Saturday and on Sunday. The patient also had bibasilar crackles. The patient had difficulty getting to the bathroom in time. Because the patient currently to the bathroom in time to urinate, a Foley catheter was placed on Monday by the hospice nurse. Since she was placed, there was notation of hematuria, likely from trauma. Unfortunately, the patient continued to worsen with his agitation on Monday. As a result, the hospice nurse went to visit him on Monday night and recommended that the patient go to the emergency department. There was no history of urinary retention. In emergency department, patient complaint of genital and inguinal pain which prompted scrotal ultrasound and diagnosis of epididymitis. Since last night, the patient has no confusion according to the patient's daughter at the bedside. Assessment/[name]: Acute epididymitis -likely caused by Enterobacteriaceae, coliforms -On exam, appears to be mild -Continue levofloxacin Acute encephalopathy -Likely multifactorial including quinolone abx, infection, opioids, worsening renal insufficiency, and hospital environment -Discussed with family to minimize opioids if possible -May need to change levofloxacin to ceftriaxone of confusion worsens -Consider holding furosemide and metolazone per 24 hours--I will DC tomorrow's dose -morning BMP-- CKD stage III -Serum creatinine 1.54 today which is worse than 1.37 on 11/06/2011 -Lasix and metolazone can be restarted tomorrow his creatinine shows improvement or is stable. Hypoxia -This appears to have happened after the  patient was given Dilaudid in ED -Patient is currently stable on nasal cannula -Patient normally is on 2 L at home--which he only intermittently wears Ischemic cardiomyopathy -Patient was given extra furosemide on Saturday and Sunday per family -Appears to be compensated at this time -Ejection fraction 25-30% s/p AICD Diabetes mellitus type 2 -Will discontinue glipizide at this time given worsening renal failure and increasing of the medication -Continue NovoLog sliding scale for now     Family Communication:   Daughter at beside Disposition [name]:   Back to [place] [facility]    Antibiotics:  Levofloxacin December 3>>>    Procedures/Studies: US Scrotum  01/25/2012  *RADIOLOGY REPORT*  Scrotal ultrasound; scrotal Doppler ultrasound  History:  Left-sided scrotal pain  Findings:  Real-time and Doppler interrogation of the scrotal contents was performed.  Both testes are normal in size and contour.  There is no intratesticular mass.  Color flow is seen in both testes.  Both testes demonstrate low resistance wave forms.  The peak systolic velocity in the right testis is  4.2 cm/sec with an end- diastolic velocity of 1.3 cm/sec.  The peak systolic velocity in the left testis is 4.8 cm/sec an end diastolic velocity of 1.4 cm/sec.  Venous outflow is demonstrated bilaterally.  The left epididymis appears somewhat edematous and hyperemic.  The right epididymis appears normal. There are no extratesticular masses on either side.  There is a small hydrocele on the left.  There is no appreciable hydrocele on the right.  There is no scrotal abscess or wall thickening.  There are small varicoceles bilaterally.  Conclusion:  Evidence of left epididymitis.  Small varicoceles. Small left hydrocele.  No intratesticular mass or torsion.   Original Report Authenticated By: Bretta Bang, M.D.    Ct Abdomen Pelvis W Contrast  01/25/2012  *  RADIOLOGY REPORT*  Clinical Data: Abdominal pain, hematuria,  recent AAA repair  CT ABDOMEN AND PELVIS WITH CONTRAST  Technique:  Multidetector CT imaging of the abdomen and pelvis was performed following the standard protocol during bolus administration of intravenous contrast.  Contrast: 80mL OMNIPAQUE IOHEXOL 300 MG/ML  SOLN the  Comparison: 11/06/2011  Findings: Cardiomegaly again noted.  Cardiac pacemaker leads are partially visualized.  Bilateral trace pleural effusion with bilateral basilar posterior atelectasis.  Trace perisplenic ascites.  No calcified gallstones are noted within gallbladder.  Enhanced liver is unremarkable.  Partially atrophic pancreas again noted. The adrenal glands are unremarkable.  Kidneys are symmetrical in size and enhancement.  No hydronephrosis or hydroureter.  Extensive atherosclerotic calcifications SMA origin, celiac trunk origin, splenic artery abdominal aorta and the iliac arteries again noted. Stable postsurgical changes distal abdominal aorta.  No small bowel obstruction.  No ascites or free air.  No adenopathy.  Stable postsurgical changes bilateral common iliac artery.  Left colon diverticula are noted without evidence of acute diverticulitis.  Multiple sigmoid colon diverticula are noted without evidence of acute diverticulitis.  Again noted right inguinal hernia containing fat and short segment of small bowel without evidence for acute complication.  There is a Foley catheter within urinary bladder.  The urinary bladder is decompressed. Small amount of air in anterior aspect urinary bladder is probable post instrumentation.  Mild enlarged prostate gland.  Sagittal images of the spine shows stable osteopenia and mild degenerative changes.  Small left inguinal hernia containing fat without evidence of acute complication.  No destructive bony lesions are noted within pelvis. No pericecal inflammation.  Normal appendix is clearly visualized.  Delayed renal images shows bilateral renal symmetrical excretion.  IMPRESSION: 1.  Stable  postsurgical changes distal abdominal aorta and bilateral common iliac artery. 2.  No small bowel obstruction. 3.  Bilateral small pleural effusion with bilateral basilar posterior atelectasis. 4.  Again noted extensive atherosclerotic vascular calcifications. 5.  Normal appendix.  No pericecal inflammation. 6.  Left colon and sigmoid colon diverticula are noted without evidence of acute diverticulitis. 7.  There is a Foley catheter within decompressed urinary bladder. Mild enlarged prostate gland. 8.  Again noted small right inguinal hernia containing fat and small bowel without evidence of acute complication.   Original Report Authenticated By: Natasha Mead, M.D.    [place] Art/ven Flow Abd Pelv Doppler  01/25/2012  *RADIOLOGY REPORT*  Scrotal ultrasound and scrotal Doppler ultrasound reports are combined into a single dictation.   Original Report Authenticated By: Bretta Bang, M.D.          Subjective: Patient is pleasantly confused. Denies any fevers, headache, chest pain, nausea, vomiting, diarrhea, abdominal pain. He complains of some inguinal/testicular pain. Because of some shortness of breath. No reports of respiratory distress from nursing staff. No vomiting. No diarrhea  Objective: Filed Vitals:   01/25/12 2221 01/26/12 1055 01/26/12 1500 01/26/12 1603  BP:   134/78   Pulse:   79   Temp:   98.8 F (37.1 C)   TempSrc:   Oral   Resp:   18   Height:      Weight:      SpO2: 97% 92% 96% 96%    Intake/Output Summary (Last 24 hours) at 01/26/12 1643 Last data filed at 01/26/12 0600  Gross per 24 hour  Intake 886.25 ml  Output      0 ml  Net 886.25 ml   Weight change:  Exam:   General:  Pt is  alert, follows commands appropriately, not in acute distress  HEENT: No icterus, No thrush, [place]/AT  Cardiovascular: RRR, S1/S2, no rubs, no gallops  Respiratory: Bibasilar crackles but no wheezes or rhonchi. Good air movement.  Abdomen: Soft/+BS, non tender, non distended, no  guarding  Extremities: No edema, No lymphangitis, No petechiae, No rashes, no synovitis  Data Reviewed: Basic Metabolic Panel:  Lab 01/26/12 7846 01/25/12 0745  NA 137 136  K 3.8 3.5  CL 95* 92*  CO2 32 36*  GLUCOSE 153* 107*  BUN 33* 34*  CREATININE 1.54* 1.57*  CALCIUM 9.3 9.8  MG -- --  PHOS -- --   Liver Function Tests:  Lab 01/26/12 0520 01/25/12 0745  AST 24 25  ALT 14 14  ALKPHOS 126* 117  BILITOT 0.5 0.6  PROT 6.6 6.7  ALBUMIN 3.3* 3.5   No results found for this basename: LIPASE:5,AMYLASE:5 in the last 168 hours No results found for this basename: AMMONIA:5 in the last 168 hours CBC:  Lab 01/26/12 0520 01/25/12 0745  WBC 6.7 8.8  NEUTROABS 4.7 6.3  HGB 11.6* 11.7*  HCT 37.8* 35.8*  MCV 98.4 95.5  PLT 181 178   Cardiac Enzymes: No results found for this basename: CKTOTAL:5,CKMB:5,CKMBINDEX:5,TROPONINI:5 in the last 168 hours BNP: No components found with this basename: POCBNP:5 CBG: No results found for this basename: GLUCAP:5 in the last 168 hours  Recent Results (from the past 240 hour(s))  URINE CULTURE     Status: Normal   Collection Time   01/25/12  8:21 AM      Component Value Range Status Comment   Specimen Description URINE, CATHETERIZED   Final    Special Requests NONE   Final    Culture  Setup Time 01/25/2012 14:06   Final    Colony Count NO GROWTH   Final    Culture NO GROWTH   Final    Report Status 01/26/2012 FINAL   Final      Scheduled Meds:   . [COMPLETED] sodium chloride   Intravenous STAT  . [COMPLETED] sodium chloride   Intravenous STAT  . allopurinol  300 mg Oral q1800  . ALPRAZolam  1 mg Oral QHS  . aspirin  81 mg Oral Daily  . diphenhydrAMINE  50 mg Oral QHS  . feeding supplement  237 mL Oral TID BM  . furosemide  80 mg Oral Daily  . glipiZIDE  5 mg Oral Daily  . [EXPIRED] HYDROmorphone      . iron polysaccharides  150 mg Oral Daily  . isosorbide-hydrALAZINE  1 tablet Oral TID  . levalbuterol  2 puff Inhalation  BID  . levofloxacin  250 mg Oral Q24H  . loratadine  10 mg Oral Daily  . metolazone  5 mg Oral Daily  . nebivolol  2.5 mg Oral Daily  . oxyCODONE  10 mg Oral TID  . pantoprazole  40 mg Oral Daily  . polyethylene glycol  17 g Oral Daily  . ranolazine  500 mg Oral Daily  . senna-docusate  3 tablet Oral BID  . sertraline  100 mg Oral Daily  . simvastatin  20 mg Oral q1800  . [DISCONTINUED] levalbuterol  1-2 puff Inhalation Q6H  . [DISCONTINUED] levalbuterol  2 puff Inhalation QID  . [DISCONTINUED] levofloxacin (LEVAQUIN) IV  500 mg Intravenous Once   Continuous Infusions:    Jhoel Stieg, DO  [facility] Pager [phone]  If 7PM-7AM, please contact night-coverage [email or web address] Password [email or web address] 01/26/2012, 4:43 PM   LOS:  1 day

## 2012-01-26 NOTE — Progress Notes (Signed)
Clinical Social Work Department BRIEF PSYCHOSOCIAL ASSESSMENT 01/26/2012  Patient:  Dustin [name], Dustin [name]     Account Number:  [id number]     Admit date:  01/25/2012  Clinical Social Worker:  Doroteo Glassman  Date/Time:  01/26/2012 12:59 PM  Referred by:  Physician  Date Referred:  01/26/2012 Referred for  SNF Placement   Other Referral:   Interview type:  Other - See comment Other interview type:   Pt's daughter    PSYCHOSOCIAL DATA Living Status:  WIFE Admitted from facility:   Level of care:   Primary support name:  J[place]ica and Bettie Primary support relationship to patient:  CHILD, ADULT Degree of support available:   strong    CURRENT CONCERNS Current Concerns  Post-Acute Placement   Other Concerns:    SOCIAL WORK ASSESSMENT / PLAN Met with Pt and daughter at bedside to discuss d/c plans.    Pt slept throughout CSW's meeting with Pt's daughter.    Pt's daughter states that Pt is from [place] at [facility] and that he was doing well until after a Thanksgiving celebration the family had at Pt's home on Saturday.    Pt's daughter stated that Pt is followed by Hospice but that she thinks he may need SNF, as his strength has decreased.  Pt's daughter stated that she feels that he may just need rehab to return him to baseline.    Pt's daughter unsure how she'd like to proceed, as far as placement goes, as she's concernd that Pt wouldn't be able to get Hospice services if he went to a SNF.  Pt's daughter to contact Hospice SW and discuss options.    CSW thanked Pt's daughter for her time.   Assessment/plan status:  Psychosocial Support/Ongoing Assessment of Needs Other assessment/ plan:   Information/referral to community resources:   n/a    PATIENT'S/FAMILY'S RESPONSE TO PLAN OF CARE: Pt's daughter thanked CSW for time and assistance.   CSW to continue to follow.  Providence Crosby, LCSWA Clinical Social Work [phone]

## 2012-01-26 NOTE — Evaluation (Signed)
Occupational Therapy Evaluation Patient Details Name: Dustin [name] MRN: [id number] DOB: 27-Aug-1926 Today's Date: 01/26/2012 Time: 1135-1200 OT Time Calculation (min): 25 min  OT Assessment / Plan / Recommendation Clinical Impression  78 yo male admitted with epididymitis. Pt is from Ind Living facility and has assist intermittently for bathing, dressing. However pt was able to ambulate in room with use of rollator without assist at home. On eval, pt currently requires +2 assist for bed mobility and functional transfers. Mobility is significantly limited by pain that appears to come in waves. Daughter present and states plan is for rehab. Recommend SNF for continued rehab to improve strength, activity tolerance, and independence with self care tasks.       OT Assessment  Patient needs continued OT Services    Follow Up Recommendations  SNF    Barriers to Discharge      Equipment Recommendations  None recommended by PT;Other (comment) (would need to clarify OT DME further if home)    Recommendations for Other Services    Frequency  Min 2X/week    Precautions / Restrictions Precautions Precautions: Fall Restrictions Weight Bearing Restrictions: No        ADL  Eating/Feeding: Other (comment) (not tested) Grooming: Simulated;Wash/dry face;Minimal assistance Where Assessed - Grooming: Supine, head of bed up Upper Body Bathing: Simulated;Chest;Right arm;Left arm;Abdomen;+1 Total assistance;Other (comment) (too much pain at EOB to use UEs in ADL) Where Assessed - Upper Body Bathing: Unsupported sitting Lower Body Bathing: +2 Total assistance Lower Body Bathing: Patient Percentage: 10% Where Assessed - Lower Body Bathing: Supported sit to stand Upper Body Dressing: Simulated;+1 Total assistance Where Assessed - Upper Body Dressing: Unsupported sitting Lower Body Dressing: +2 Total assistance;Other (comment) (too much pain) Lower Body Dressing: Patient Percentage: 0% Where Assessed  - Lower Body Dressing: Supported sit to stand Toilet Transfer: Simulated;+2 Total assistance Toilet Transfer: Patient Percentage: 40% Statistician Method: Sit to stand Toileting - Architect and Hygiene: Simulated;+2 Total assistance Toileting - Architect and Hygiene: Patient Percentage: 0% Where Assessed - Glass blower/designer Manipulation and Hygiene: Standing Tub/Shower Transfer Method: Not assessed ADL Comments: Pt with 9/10 pain  reported at rest just above genital area. Pt in alot of pain with movement just to EOB and standing to adjust pad so returned to supine. Not able to really use UEs very functionally in ADL due to having to brace the bed with UEs frequently because of pain. Took a few side steps toward HOB to return to supine +2total pt 40%.   OT Diagnosis: Generalized weakness;Acute pain  OT Problem List: Decreased strength;Decreased activity tolerance;Decreased knowledge of use of DME or AE;Pain;Decreased cognition OT Treatment Interventions: Self-care/ADL training;Therapeutic activities;DME and/or AE instruction;Patient/family education   OT Goals Acute Rehab OT Goals OT Goal Formulation: With patient/family Time For Goal Achievement: 02/09/12 Potential to Achieve Goals: Fair ADL Goals Pt Will Perform Grooming: with min assist;Sitting, edge of bed;Sitting, chair;Unsupported ADL Goal: Grooming - Progress: Goal set today Pt Will Perform Upper Body Bathing: with min assist;Sitting, edge of bed;Sitting, chair;Unsupported ADL Goal: Upper Body Bathing - Progress: Goal set today Pt Will Perform Lower Body Bathing: with 2+ total assist;Other (comment) (pt 50%) ADL Goal: Lower Body Bathing - Progress: Goal set today Pt Will Transfer to Toilet: with 2+ total assist;with DME;3-in-1;Stand pivot transfer;Other (comment) (pt 75%) ADL Goal: Toilet Transfer - Progress: Goal set today Additional ADL Goal #1: Pt will transfer to EOB in prep for ADL with mod  assist ADL Goal: Additional Goal #1 -  Progress: Goal set today  Visit Information  Last OT Received On: 01/26/12 Assistance Needed: +2 PT/OT Co-Evaluation/Treatment: Yes    Subjective Data  Subjective: I can try (when asked about getting up) Patient Stated Goal: none stated. agreeable to try to get up   Prior Functioning     Home Living Lives With: Spouse Available Help at Discharge: Personal care attendant (intermittently but not daily) Type of Home: Independent living facility Home Access: Level entry Home Layout: One level Home Adaptive Equipment: Dan Humphreys - four wheeled Additional Comments: plan is for SNF Prior Function Level of Independence: Needs assistance Needs Assistance: Gait;Bathing;Dressing;Meal Prep;Light Housekeeping Bath: Minimal Dressing: Minimal Meal Prep: Total Light Housekeeping: Total Gait Assistance: Pt switches between WC and RW depending on ability. Does use walker in room unaided, and WC to get to dining room. Daughter states he has a hospice aid that helps with bathing and on the days they arent there, wife helps with socks/LB ADL.         Vision/Perception     Cognition  Overall Cognitive Status: Impaired Area of Impairment: Attention;Memory;Following commands;Safety/judgement;Awareness of errors;Awareness of deficits;Problem solving Arousal/Alertness: Awake/alert Orientation Level: Disoriented to;Place;Time;Situation Behavior During Session: Anxious Current Attention Level: Sustained Following Commands: Follows one step commands with increased time Safety/Judgement: Decreased safety judgement for tasks assessed Awareness of Errors: Assistance required to identify errors made;Assistance required to correct errors made Cognition - Other Comments: overall seemed confused during eval. had difficulty answering basic question about whether he used a walker versus cane at home, etc    Extremity/Trunk Assessment Right Upper Extremity  Assessment RUE ROM/Strength/Tone: Unable to fully assess;Due to pain (due to pain but appears weak) Left Upper Extremity Assessment LUE ROM/Strength/Tone: Unable to fully assess;Due to pain (overall appears weak in UEs. difficulty reaching for rail) Right Lower Extremity Assessment RLE ROM/Strength/Tone: Unable to fully assess;Due to pain Left Lower Extremity Assessment LLE ROM/Strength/Tone: Unable to fully assess;Due to pain     Mobility Bed Mobility Bed Mobility: Supine to Sit Supine to Sit: 1: +2 Total assist;HOB elevated;With rails Supine to Sit: Patient Percentage: 20% Details for Bed Mobility Assistance: Increased time. Assist for trunk to upright and bil LEs off bed. Multimodal cues for safety, technique, hand placement Transfers Sit to Stand: 1: +2 Total assist;From bed;From elevated surface Sit to Stand: Patient Percentage: 40% Stand to Sit: 1: +2 Total assist;To elevated surface;To bed Stand to Sit: Patient Percentage: 40% Details for Transfer Assistance: Assist to rise, stabilize, control descent. Multimodal cues for safety, techniquer, hand placement.      Shoulder Instructions     Exercise     Balance Balance Balance Assessed: Yes Static Sitting Balance Static Sitting - Balance Support: Bilateral upper extremity supported;Feet supported Static Sitting - Level of Assistance: 4: Min assist;3: Mod assist Static Sitting - Comment/# of Minutes: Sat EOB 2-3 minutes with varyling level of assist between Min-Mod. Leaning posteriorly. VCs/assist for anterior weightshifting.  Static Standing Balance Static Standing - Balance Support: Bilateral upper extremity supported Static Standing - Level of Assistance: 1: +1 Total assist   End of Session OT - End of Session Activity Tolerance: Patient limited by pain Patient left: in bed;with call bell/phone within reach;with family/visitor present  GO     Lennox Laity [phone] 01/26/2012, 1:04 PM

## 2012-01-26 NOTE — Progress Notes (Signed)
Room 1516 - Dustin [name] - [facility] RN Visit-R.Faithann Natal RN  Related admission to [facility] diagnosis of CHF.  Pt is DNR code.   Pt alert, attempting to eat soup and grilled cheese sandwich. Wife and dtr present - breaking sandwich into small bites and feeding pt soup with spoon.  Pt lying in bed, with complaints of 7/10 pain.  Dtr discussed outside room that pt was very confused but has times of being oriented also.  Dtr is concerned over medication Dilaudid is causing the confusion that has not been seen previously at home. Pt now on Oxycodone.  Pt more alert during visit today.   Patient's home medication list is on shadow chart.   Please call [facility] @ [phone]- ask for RN Liaison or after hours,ask for on-call RN with any hospice needs.    Thank you.  [name], RN  [facility]  Hospice Liaison

## 2012-01-26 NOTE — Progress Notes (Signed)
[facility] [place]: Sw note:  Pt in bed, did recognize Sw but confused in conversation, unable to answer questions when asked if he slept well or not, did report he is still in pain. Sw met with Pt's daughter Janeth Rase in the hallway as Pt was working in [employer]. Matissa reported that she has met with hospital Sw, she reported that Pt has declined significantly over the last 4 or 5 days. Matissa stated that she was unsure if Pt's decline is related to his infection or disease progression. Per Matissa her brother [place] who is joint HCPOA is coming in from [place] later today. Sw provided active/reflective listening, emotional support during visit. Will continue to follow for support during hospital stay. Lorain Childes, LCSW, ACHP-SW [facility] [phone]

## 2012-01-26 NOTE — Evaluation (Signed)
Physical Therapy Evaluation Patient Details Name: Dustin [name] MRN: [id number] DOB: 1926/08/24 Today's Date: 01/26/2012 Time: 1135-1200 PT Time Calculation (min): 25 min  PT Assessment / Plan / Recommendation Clinical Impression  78 yo male admitted with epididymitis. Pt is from Ind Living facility and has assist intermittently for bathing, dressing. However pt was able to ambulate in room with use of rollator without assist. On eval, pt currently requires +2 assist for bed mobility and transfers. Mobility is significantly limited by pain that appears to come in waves. Daughter present and states plan is for rehab. Recommend SNF for continued rehab to improve strength, activity tolerance, gait and balance.     PT Assessment  Patient needs continued PT services    Follow Up Recommendations  SNF    Does the patient have the potential to tolerate intense rehabilitation      Barriers to Discharge        Equipment Recommendations  None recommended by PT    Recommendations for Other Services OT consult   Frequency Min 3X/week    Precautions / Restrictions Precautions Precautions: Fall Restrictions Weight Bearing Restrictions: No   Pertinent Vitals/Pain 9/10 abdomen, penis      Mobility  Bed Mobility Bed Mobility: Supine to Sit Supine to Sit: 1: +2 Total assist;HOB elevated;With rails Supine to Sit: Patient Percentage: 20% Details for Bed Mobility Assistance: Increased time. Assist for trunk to upright and bil LEs off bed. Multimodal cues for safety, technique, hand placement Transfers Transfers: Sit to Stand;Stand to Sit Sit to Stand: 1: +2 Total assist;From bed;From elevated surface Sit to Stand: Patient Percentage: 40% Stand to Sit: 1: +2 Total assist;To elevated surface;To bed Stand to Sit: Patient Percentage: 40% Details for Transfer Assistance: Assist to rise, stabilize, control descent. Multimodal cues for safety, techniquer, hand placement.   Ambulation/Gait Ambulation/Gait Assistance: 1: +2 Total assist Ambulation/Gait: Patient Percentage: 40% Ambulation/Gait Assistance Details: 3 side steps to [facility] with RW    Shoulder Instructions     Exercises     PT Diagnosis: Difficulty walking;Generalized weakness;Acute pain;Altered mental status  PT Problem List: Decreased strength;Decreased activity tolerance;Decreased balance;Decreased mobility;Pain;Decreased safety awareness;Decreased knowledge of use of DME;Decreased cognition PT Treatment Interventions: DME instruction;Gait training;Functional mobility training;Therapeutic activities;Therapeutic exercise;Balance training;Patient/family education   PT Goals Acute Rehab PT Goals PT Goal Formulation: With family Time For Goal Achievement: 02/09/12 Potential to Achieve Goals: Good Pt will go Supine/Side to Sit: with min assist PT Goal: Supine/Side to Sit - Progress: Goal set today Pt will go Sit to Supine/Side: with min assist PT Goal: Sit to Supine/Side - Progress: Goal set today Pt will go Sit to Stand: with min assist PT Goal: Sit to Stand - Progress: Goal set today Pt will go Stand to Sit: with min assist PT Goal: Stand to Sit - Progress: Goal set today Pt will Transfer Bed to Chair/Chair to Bed: with min assist PT Transfer Goal: Bed to Chair/Chair to Bed - Progress: Goal set today Pt will Ambulate: 1 - 15 feet;with min assist;with rolling walker PT Goal: Ambulate - Progress: Goal set today  Visit Information  Last PT Received On: 01/26/12 Assistance Needed: +2 PT/OT Co-Evaluation/Treatment: Yes    Subjective Data  Subjective: "Oh I gotta pee!!!!!!" Patient Stated Goal: Daughter states plan is for rehab   Prior Functioning  Home Living Lives With: Spouse Available Help at Discharge: Personal care attendant (intermittently but not daily) Type of Home: Independent living facility Home Access: Level entry Home Layout: One level Home Adaptive Equipment:  Walker -  four wheeled Additional Comments: plan is for SNF Prior Function Level of Independence: Needs assistance Needs Assistance: Gait Gait Assistance: Pt switches between WC and RW depending on ability. Does use walker in room unaided, and WC to get to dining room.     Cognition  Overall Cognitive Status: Impaired Area of Impairment: Attention;Memory;Following commands;Safety/judgement;Awareness of errors;Awareness of deficits;Problem solving Arousal/Alertness: Awake/alert Orientation Level: Disoriented to;Place;Time;Situation Behavior During Session: Anxious Current Attention Level: Sustained Following Commands: Follows one step commands with increased time Safety/Judgement: Decreased safety judgement for tasks assessed Awareness of Errors: Assistance required to identify errors made;Assistance required to correct errors made Cognition - Other Comments: appeared generally confused throughout session    Extremity/Trunk Assessment Right Lower Extremity Assessment RLE ROM/Strength/Tone: Unable to fully assess;Due to pain Left Lower Extremity Assessment LLE ROM/Strength/Tone: Unable to fully assess;Due to pain   Balance Balance Balance Assessed: Yes Static Sitting Balance Static Sitting - Balance Support: Bilateral upper extremity supported;Feet supported Static Sitting - Level of Assistance: 4: Min assist;3: Mod assist Static Sitting - Comment/# of Minutes: Sat EOB 2-3 minutes with varyling level of assist between Min-Mod. Leaning posteriorly. VCs/assist for anterior weightshifting.  Static Standing Balance Static Standing - Balance Support: Bilateral upper extremity supported Static Standing - Level of Assistance: 1: +1 Total assist  End of Session PT - End of Session Activity Tolerance: Patient limited by pain;Patient limited by fatigue Patient left: in bed;with call bell/phone within reach;with family/visitor present  GP     Rebeca Alert [facility] 01/26/2012, 12:16 PM [phone]

## 2012-01-26 NOTE — Progress Notes (Signed)
Nutrition Brief Note  Patient identified on the Malnutrition Screening Tool (MST) Report  Body mass index is 25.30 kg/(m^2). Pt meets criteria for overweight based on current BMI.   - Pt is a hospice care pt r/t CHF. Family reports he was not eating well PTA. Past records show pt's weight was 164 pounds in July of this year, now up to 171 pounds. Hospice RN noted that pt with some confusion today with family reporting pt has declined significantly in the past 4-5 days. Pt not eating much today per family. Recommend comfort feeds per pt and family wishes. No nutrition interventions warranted at this time. If nutrition issues arise, please consult RD.   Levon Hedger MS, RD, LDN [phone] Pager [phone] After Hours Pager

## 2012-01-27 ENCOUNTER — Inpatient Hospital Stay (HOSPITAL_COMMUNITY)

## 2012-01-27 DIAGNOSIS — J209 Acute bronchitis, unspecified: Secondary | ICD-10-CM

## 2012-01-27 LAB — CBC
Hemoglobin: 9.8 g/dL — ABNORMAL LOW (ref 13.0–17.0)
MCH: 31.1 pg (ref 26.0–34.0)
MCHC: 32.3 g/dL (ref 30.0–36.0)
MCV: 96.2 fL (ref 78.0–100.0)
Platelets: 165 10*3/uL (ref 150–400)
RBC: 3.15 MIL/uL — ABNORMAL LOW (ref 4.22–5.81)

## 2012-01-27 LAB — BASIC METABOLIC PANEL
CO2: 30 mEq/L (ref 19–32)
Calcium: 9.1 mg/dL (ref 8.4–10.5)
Creatinine, Ser: 2.07 mg/dL — ABNORMAL HIGH (ref 0.50–1.35)
Glucose, Bld: 104 mg/dL — ABNORMAL HIGH (ref 70–99)

## 2012-01-27 MED ORDER — FUROSEMIDE 10 MG/ML IJ SOLN
20.0000 mg | Freq: Two times a day (BID) | INTRAMUSCULAR | Status: DC
  Administered 2012-01-28 (×2): 20 mg via INTRAVENOUS
  Filled 2012-01-27 (×4): qty 2

## 2012-01-27 MED ORDER — BISACODYL 10 MG RE SUPP
10.0000 mg | Freq: Once | RECTAL | Status: AC
  Administered 2012-01-27: 10 mg via RECTAL
  Filled 2012-01-27: qty 1

## 2012-01-27 NOTE — Progress Notes (Signed)
Physical Therapy Treatment Patient Details Name: Dustin [name] MRN: [id number] DOB: 11/17/1926 Today's Date: 01/27/2012 Time: 4098-1191 PT Time Calculation (min): 24 min  PT Assessment / Plan / Recommendation Comments on Treatment Session  Still experiencing increased pain with activity. Able to initiate transfers/pivot today, but pt unable to tolerate sitting in recliner. continue to recommend snf. Per daughter, pt has disease process similar to ALS.    Follow Up Recommendations  SNF     Does the patient have the potential to tolerate intense rehabilitation     Barriers to Discharge        Equipment Recommendations  None recommended by PT    Recommendations for Other Services OT consult  Frequency Min 3X/week   Plan Discharge plan remains appropriate    Precautions / Restrictions Precautions Precautions: Fall Restrictions Weight Bearing Restrictions: No   Pertinent Vitals/Pain Penis/groin-unable to rate    Mobility  Bed Mobility Bed Mobility: Supine to Sit;Sit to Supine Supine to Sit: 1: +2 Total assist Supine to Sit: Patient Percentage: 30% Sit to Supine: 1: +2 Total assist Sit to Supine: Patient Percentage: 30% Details for Bed Mobility Assistance: Increased time. Assist for trunk to upright and bil LEs off bed. Multimodal cues for safety, technique, hand placement  Transfers Transfers: Sit to Stand;Stand to Sit;Stand Pivot Transfers Sit to Stand: 1: +2 Total assist;From bed;From chair/3-in-1 Sit to Stand: Patient Percentage: 50% Stand to Sit: 1: +2 Total assist;To chair/3-in-1;To bed Stand to Sit: Patient Percentage: 50% Stand Pivot Transfers: 1: +2 Total assist Stand Pivot Transfers: Patient Percentage: 50% Details for Transfer Assistance: Increased time. Assist for trunk to upright and bil LEs off bed. Multimodal cues for safety, technique, hand placement. Able to attempt pivot this session with use of RW-increased time, bil knee buckling.   Ambulation/Gait Ambulation/Gait Assistance: Not tested (comment) Ambulation/Gait Assistance Details: Unable to safely attempt due to bil knee buckling    Exercises     PT Diagnosis:    PT Problem List:   PT Treatment Interventions:     PT Goals Acute Rehab PT Goals Pt will go Supine/Side to Sit: with min assist PT Goal: Supine/Side to Sit - Progress: Progressing toward goal Pt will go Sit to Supine/Side: with min assist PT Goal: Sit to Supine/Side - Progress: Progressing toward goal Pt will go Sit to Stand: with min assist PT Goal: Sit to Stand - Progress: Progressing toward goal Pt will go Stand to Sit: with min assist PT Goal: Stand to Sit - Progress: Progressing toward goal Pt will Transfer Bed to Chair/Chair to Bed: with min assist PT Transfer Goal: Bed to Chair/Chair to Bed - Progress: Progressing toward goal  Visit Information  Last PT Received On: 01/27/12 Assistance Needed: +2    Subjective Data  Subjective: "Ahhhh!!!!" Patient Stated Goal: None stated   Cognition  Overall Cognitive Status: Impaired Area of Impairment: Attention;Memory;Following commands;Safety/judgement;Awareness of errors;Awareness of deficits Arousal/Alertness: Awake/alert Orientation Level: Disoriented to;Place;Time;Situation Behavior During Session: Anxious Current Attention Level: Sustained Following Commands: Follows one step commands with increased time Safety/Judgement: Decreased safety judgement for tasks assessed Awareness of Errors: Assistance required to correct errors made;Assistance required to identify errors made    Balance     End of Session PT - End of Session Equipment Utilized During Treatment: Gait belt Activity Tolerance: Patient limited by pain;Patient limited by fatigue Patient left: in bed;with call bell/phone within reach;with family/visitor present   GP     Rebeca Alert [facility] 01/27/2012, 1:10 PM [phone]

## 2012-01-27 NOTE — Progress Notes (Addendum)
Room 1516 - Gilman Nolt - [facility] RN Visit-R.Latavia Goga RN  Related admission to [facility] diagnosis of CHF.  Pt is DNR code.    Pt lying in bed, sleeping soundly - did arouse to interact with family and went back to sleep.  Family reports pt has less pain and is on Oxycodone scheduled with [facility] for breakthrough pain.    Clarification:  Pt had weight gain of 7lbs in one day at home prior to admission  [facility] was called and pt seen by RN - foley was placed on Monday 1 day prior to pt's exacerbation of abd pain and hematuria and subsequent transfer to ED.  Pt's dtr and son at bedside. Patient's home medication list is on shadow chart.   Please call [facility] @ [phone]- ask for RN Liaison or after hours,ask for on-call RN with any hospice needs.    Thank you.  [name], RN  [facility]  Hospice Liaison

## 2012-01-27 NOTE — Progress Notes (Addendum)
[facility] PROGRESS NOTE  Dustin [name] ZOX:[id number] DOB: 07/23/1926 DOA: 01/25/2012 PCP: Kimber Relic, MD   Brief history: 78 year old gentleman who began having confusion on Saturday night into Sunday morning. The patient currently receives home hospice at his ALF.  Hospice noted increased 7 pounds gain since July and patient was given extra doses of furosemide on Saturday and on Sunday. The patient also had bibasilar crackles. The patient had difficulty getting to the bathroom in time. Because the patient currently to the bathroom in time to urinate, a Foley catheter was placed on Monday by the hospice nurse. Since she was placed, there was notation of hematuria, likely from trauma. Unfortunately, the patient continued to worsen with his agitation on Monday. As a result, the hospice nurse went to visit him on Monday night and recommended that the patient go to the emergency department. There was no history of urinary retention. In emergency department, patient complaint of genital and inguinal pain which prompted scrotal ultrasound and diagnosis of epididymitis. Since last night, the patient has no confusion according to the patient's daughter at the bedside.  Assessment/[name]: Acute epididymitis -likely caused by Enterobacteriaceae, coliforms -On exam, appears to be mild -Continue levofloxacin  Acute encephalopathy Resolved. Most likely due to UTI Continue with levaquin  CKD stage III Cr is worse today. He has been off diuretics. Will monitor the creatinine in am. As he has been started on lasix,. Will obtain renal ultrasound.  Hypoxia -This appears to have happened after the patient was given Dilaudid in ED -Patient is currently stable on nasal cannula -Patient normally is on 2 L at home--which he only intermittently wears  Ischemic cardiomyopathy -Will start on IV lasix 20 mg q 12 hrs. -Ejection fraction 25-30% s/p AICD  Diabetes mellitus type 2 -Will discontinue  glipizide at this time given worsening renal failure and increasing of the medication -Continue NovoLog sliding scale for now     Family Communication:   Daughter at beside, discussed with daughter. Disposition [name]:   snf    Antibiotics:  Levofloxacin December 3>>>    Procedures/Studies: US Scrotum  01/25/2012  *RADIOLOGY REPORT*  Scrotal ultrasound; scrotal Doppler ultrasound  History:  Left-sided scrotal pain  Findings:  Real-time and Doppler interrogation of the scrotal contents was performed.  Both testes are normal in size and contour.  There is no intratesticular mass.  Color flow is seen in both testes.  Both testes demonstrate low resistance wave forms.  The peak systolic velocity in the right testis is  4.2 cm/sec with an end- diastolic velocity of 1.3 cm/sec.  The peak systolic velocity in the left testis is 4.8 cm/sec an end diastolic velocity of 1.4 cm/sec.  Venous outflow is demonstrated bilaterally.  The left epididymis appears somewhat edematous and hyperemic.  The right epididymis appears normal. There are no extratesticular masses on either side.  There is a small hydrocele on the left.  There is no appreciable hydrocele on the right.  There is no scrotal abscess or wall thickening.  There are small varicoceles bilaterally.  Conclusion:  Evidence of left epididymitis.  Small varicoceles. Small left hydrocele.  No intratesticular mass or torsion.   Original Report Authenticated By: Bretta Bang, M.D.    Ct Abdomen Pelvis W Contrast  01/25/2012  *RADIOLOGY REPORT*  Clinical Data: Abdominal pain, hematuria, recent AAA repair  CT ABDOMEN AND PELVIS WITH CONTRAST  Technique:  Multidetector CT imaging of the abdomen and pelvis was performed following the standard protocol during bolus administration  of intravenous contrast.  Contrast: 80mL OMNIPAQUE IOHEXOL 300 MG/ML  SOLN the  Comparison: 11/06/2011  Findings: Cardiomegaly again noted.  Cardiac pacemaker leads are partially  visualized.  Bilateral trace pleural effusion with bilateral basilar posterior atelectasis.  Trace perisplenic ascites.  No calcified gallstones are noted within gallbladder.  Enhanced liver is unremarkable.  Partially atrophic pancreas again noted. The adrenal glands are unremarkable.  Kidneys are symmetrical in size and enhancement.  No hydronephrosis or hydroureter.  Extensive atherosclerotic calcifications SMA origin, celiac trunk origin, splenic artery abdominal aorta and the iliac arteries again noted. Stable postsurgical changes distal abdominal aorta.  No small bowel obstruction.  No ascites or free air.  No adenopathy.  Stable postsurgical changes bilateral common iliac artery.  Left colon diverticula are noted without evidence of acute diverticulitis.  Multiple sigmoid colon diverticula are noted without evidence of acute diverticulitis.  Again noted right inguinal hernia containing fat and short segment of small bowel without evidence for acute complication.  There is a Foley catheter within urinary bladder.  The urinary bladder is decompressed. Small amount of air in anterior aspect urinary bladder is probable post instrumentation.  Mild enlarged prostate gland.  Sagittal images of the spine shows stable osteopenia and mild degenerative changes.  Small left inguinal hernia containing fat without evidence of acute complication.  No destructive bony lesions are noted within pelvis. No pericecal inflammation.  Normal appendix is clearly visualized.  Delayed renal images shows bilateral renal symmetrical excretion.   IMPRESSION: 1.  Stable postsurgical changes distal abdominal aorta and bilateral common iliac artery. 2.  No small bowel obstruction. 3.  Bilateral small pleural effusion with bilateral basilar posterior atelectasis. 4.  Again noted extensive atherosclerotic vascular calcifications. 5.  Normal appendix.  No pericecal inflammation. 6.  Left colon and sigmoid colon diverticula are noted without  evidence of acute diverticulitis. 7.  There is a Foley catheter within decompressed urinary bladder. Mild enlarged prostate gland. 8.  Again noted small right inguinal hernia containing fat and small bowel without evidence of acute complication.   Original Report Authenticated By: Natasha Mead, M.D.    [place] Art/ven Flow Abd Pelv Doppler  01/25/2012  *RADIOLOGY REPORT*  Scrotal ultrasound and scrotal Doppler ultrasound reports are combined into a single dictation.   Original Report Authenticated By: Bretta Bang, M.D.        Subjective: Patient is better today, oriented to person, still had pain while sitting in recliner. PT recommends SNF  Objective: Filed Vitals:   01/26/12 2136 01/26/12 2139 01/27/12 0511 01/27/12 1200  BP:  133/74 145/75   Pulse:  75 67   Temp:  99.1 F (37.3 C) 98.3 F (36.8 C)   TempSrc:  Oral Oral   Resp:  18 20   Height:      Weight:      SpO2: 95% 97% 90% 92%    Intake/Output Summary (Last 24 hours) at 01/27/12 1350 Last data filed at 01/27/12 4540  Gross per 24 hour  Intake    240 ml  Output   1165 ml  Net   -925 ml   Weight change:  Exam:   General:  Pt is alert, follows commands appropriately, not in acute distress  HEENT: No icterus, No thrush, [place]/AT  Cardiovascular: RRR, S1/S2, no rubs, no gallops  Respiratory: Bibasilar crackles but no wheezes or rhonchi. Good air movement.  Abdomen: Soft/+BS, non tender, non distended, no guarding  Scrotum: No erythema, positive testicular tenderness on palpation  Extremities: No  edema, No lymphangitis, No petechiae, No rashes, no synovitis  Data Reviewed: Basic Metabolic Panel:  Lab 01/27/12 4540 01/26/12 0520 01/25/12 0745  NA 137 137 136  K 3.8 3.8 3.5  CL 97 95* 92*  CO2 30 32 36*  GLUCOSE 104* 153* 107*  BUN 45* 33* 34*  CREATININE 2.07* 1.54* 1.57*  CALCIUM 9.1 9.3 9.8  MG -- -- --  PHOS -- -- --   Liver Function Tests:  Lab 01/26/12 0520 01/25/12 0745  AST 24 25  ALT 14 14   ALKPHOS 126* 117  BILITOT 0.5 0.6  PROT 6.6 6.7  ALBUMIN 3.3* 3.5   No results found for this basename: LIPASE:5,AMYLASE:5 in the last 168 hours No results found for this basename: AMMONIA:5 in the last 168 hours CBC:  Lab 01/27/12 0455 01/26/12 0520 01/25/12 0745  WBC 7.6 6.7 8.8  NEUTROABS -- 4.7 6.3  HGB 9.8* 11.6* 11.7*  HCT 30.3* 37.8* 35.8*  MCV 96.2 98.4 95.5  PLT 165 181 178   Cardiac Enzymes: No results found for this basename: CKTOTAL:5,CKMB:5,CKMBINDEX:5,TROPONINI:5 in the last 168 hours BNP: No components found with this basename: POCBNP:5 CBG: No results found for this basename: GLUCAP:5 in the last 168 hours  Recent Results (from the past 240 hour(s))  URINE CULTURE     Status: Normal   Collection Time   01/25/12  8:21 AM      Component Value Range Status Comment   Specimen Description URINE, CATHETERIZED   Final    Special Requests NONE   Final    Culture  Setup Time 01/25/2012 14:06   Final    Colony Count NO GROWTH   Final    Culture NO GROWTH   Final    Report Status 01/26/2012 FINAL   Final      Scheduled Meds:    . allopurinol  300 mg Oral q1800  . ALPRAZolam  1 mg Oral QHS  . aspirin  81 mg Oral Daily  . bisacodyl  10 mg Rectal Once  . diphenhydrAMINE  50 mg Oral QHS  . feeding supplement  237 mL Oral TID BM  . glipiZIDE  5 mg Oral Daily  . iron polysaccharides  150 mg Oral Daily  . isosorbide-hydrALAZINE  1 tablet Oral TID  . levalbuterol  2 puff Inhalation BID  . levofloxacin  250 mg Oral Q24H  . loratadine  10 mg Oral Daily  . nebivolol  2.5 mg Oral Daily  . oxyCODONE  10 mg Oral TID  . pantoprazole  40 mg Oral Daily  . polyethylene glycol  17 g Oral Daily  . ranolazine  500 mg Oral Daily  . senna-docusate  3 tablet Oral BID  . sertraline  100 mg Oral Daily  . simvastatin  20 mg Oral q1800  . [DISCONTINUED] furosemide  80 mg Oral Daily  . [DISCONTINUED] levalbuterol  2 puff Inhalation QID  . [DISCONTINUED] metolazone  5 mg Oral  Daily   Continuous Infusions:    Meredeth Ide, MD [facility] Pager [phone]  If 7PM-7AM, please contact night-coverage [email or web address] Password [email or web address] 01/27/2012, 1:50 PM   LOS: 2 days

## 2012-01-27 NOTE — Progress Notes (Signed)
Met with daughter to discuss PT/OT recommendations.  Daughter gave CSW permission to send Pt's information to all [place] SNFs.  CSW to continue to follow.  Providence Crosby, LCSWA Clinical Social Work [phone]

## 2012-01-27 NOTE — Progress Notes (Addendum)
Clinical Social Work Department CLINICAL SOCIAL WORK PLACEMENT NOTE 01/27/2012  Patient:  Dustin [name], Dustin [name]  Account Number:  [id number] Admit date:  01/25/2012  Clinical Social Worker:  Doroteo Glassman  Date/time:  01/27/2012 09:56 AM  Clinical Social Work is seeking post-discharge placement for this patient at the following level of care:   SKILLED NURSING   (*CSW will update this form in Epic as items are completed)   Will provide when offers received  Patient/family provided with [facility] Department of Clinical Social Work's list of facilities offering this level of care within the geographic area requested by the patient (or if unable, by the patient's family).  01/26/2012  Patient/family informed of their freedom to choose among providers that offer the needed level of care, that participate in Medicare, Medicaid or managed care program needed by the patient, have an available bed and are willing to accept the patient.    Patient/family informed of [facility]' ownership interest in [facility], as well as of the fact that they are under no obligation to receive care at this facility.  PASARR submitted to EDS on 01/27/2012 PASARR number received from EDS on 01/27/2012  FL2 transmitted to all facilities in geographic area requested by pt/family on  01/27/2012 FL2 transmitted to all facilities within larger geographic area on   Patient informed that his/her managed care company has contracts with or will negotiate with  certain facilities, including the following:     Patient/family informed of bed offers received:  02/01/2012 Patient chooses bed at [facility] Physician recommends and patient chooses bed at    Patient to be transferred to  on  [facility] on 02/02/2012 Patient to be transferred to facility by ambulance Sharin Mons)  The following physician request were entered in Epic:   Additional Comments:  CSW to  continue to follow.  Providence Crosby, LCSWA Clinical Social Work [phone]

## 2012-01-28 DIAGNOSIS — R0602 Shortness of breath: Secondary | ICD-10-CM

## 2012-01-28 DIAGNOSIS — Z95810 Presence of automatic (implantable) cardiac defibrillator: Secondary | ICD-10-CM

## 2012-01-28 LAB — BASIC METABOLIC PANEL
BUN: 51 mg/dL — ABNORMAL HIGH (ref 6–23)
Calcium: 9.3 mg/dL (ref 8.4–10.5)
GFR calc non Af Amer: 26 mL/min — ABNORMAL LOW (ref >90)
Glucose, Bld: 71 mg/dL (ref 70–99)

## 2012-01-28 LAB — CBC
MCH: 30.6 pg (ref 26.0–34.0)
MCHC: 32.1 g/dL (ref 30.0–36.0)
Platelets: 165 10*3/uL (ref 150–400)
RDW: 14.9 % (ref 11.5–15.5)

## 2012-01-28 MED ORDER — OXYBUTYNIN CHLORIDE 5 MG PO TABS
5.0000 mg | ORAL_TABLET | Freq: Three times a day (TID) | ORAL | Status: DC | PRN
  Administered 2012-01-28 – 2012-01-30 (×4): 5 mg via ORAL
  Filled 2012-01-28 (×4): qty 1

## 2012-01-28 MED ORDER — TAMSULOSIN HCL 0.4 MG PO CAPS
0.4000 mg | ORAL_CAPSULE | Freq: Every day | ORAL | Status: DC
  Administered 2012-01-28 – 2012-01-31 (×4): 0.4 mg via ORAL
  Filled 2012-01-28 (×4): qty 1

## 2012-01-28 MED ORDER — FLEET ENEMA 7-19 GM/118ML RE ENEM
1.0000 | ENEMA | Freq: Once | RECTAL | Status: AC
  Administered 2012-01-28: 14:00:00 via RECTAL
  Filled 2012-01-28: qty 1

## 2012-01-28 NOTE — Progress Notes (Signed)
[facility]: Dustin [name] note  Pt in bed, did recognize Dustin [name]. Pt having some pain, shown by facial grimace. Son and dtr at bedside. They reported Pt seemed more confused today and sleepy. Family stated they are visiting SNF's to determine which facility they would like Pt to go to for rehab and that they are working with hospital Dustin [name] about DC plans for Pt. Family also stated it was their understanding that Pt would be moving to Palliative Care Unit today and that a urology consult has been requested for Pt. Dustin [name] provided emotional support and active/reflective listening during visit. Dustin [name] will continue to follow for support for Pt/family during hospitalization. Lorain Childes, LCSW, [name]

## 2012-01-28 NOTE — Progress Notes (Signed)
Provided family with bed offers.  Family to tour facilities and notify CSW of their choice.  Per MD, Pt will be d/c'd to Palliative Care Unit today.  Unit CSW to follow Pt and assist with d/c.  CSW to continue to follow.  Providence Crosby, LCSWA Clinical Social Work [phone]

## 2012-01-28 NOTE — Consult Note (Signed)
Urology Consult  Referring physician:Lama Reason for referral  Epididymitis/urinary retention  History of Present Illness: patient is 78 years of age. He is under hospice care for congestive heart failure. No obvious urologic history. The patient did develop some increased confusion as well as what appears to be an exacerbation of CHF. A Foley catheter was placed by the hospice nurse due to severe urinary frequency. It is not clear if a large amount was obtained suggesting urinary retention. Patient's urinalysis was abnormal but urine culture was negative. The patient complained of suprapubic as well as some scrotal discomfort. Scrotal ultrasonography was performed which showed evidence of probable epididymitis with increased edema and blood flow. No other pathology was appreciated. The patient has continued to have an indwelling Foley catheter. He is primarily complaining of intermittent suprapubic/pelvic discomfort consistent with bladder spasm.  Past Medical History  Diagnosis Date  . IHD (ischemic heart disease)      -  non-ST elevation MI in July 2009. and NSTEMI Jan 2010 in setting of RLL CAP. 99% diagonal  disease s/p stent July of 2009. Cath Jan 2010 showed  occulusion of SVG to RCA   LVEF 45%. rec med rx   - Echo 10/29/08 EF 25%  . PAD (peripheral artery disease)   . Hyperlipidemia   . Hypertension   . Obstructive sleep apnea      Refuses to wear his CPAP.  Not on oxygen   . Vocal cord dysfunction     with Botox injection at [facility] around 2005  per patient.     - Patient's swallowing difficulties may be related to vocal    cord dysfunction, recommended to be followed up at [facility]  . Stroke     History of left brain cerebrovascular accident in 1998 without   . Dysphagia       -swallowing study nl oropharyngeal swallow, brief pause  at top of the esophagus which clears with liquid wash or small bites.    Rec  regular diet with thin liquids and continue  management of    esophageal dysmotility.   - Barium swallow 10/31/08  mild dilation upper esosphagus, moderate slowing, can't r/o mass  . Anemia         03/21/2008 hgb 10.9, MCV 86, Stool occult blood postiive -> On IV Iron   . COPD (chronic obstructive pulmonary disease)     - Hypoxemia, former smoker-  FEV1 1.86 (72%) , ratio 68% (07/26/08) - FEV1 1.61 (62%) , ratio 74    ( 10/29/08) with no insp or exp truncation on f/v loop   Past Surgical History  Procedure Date  . Coronary artery bypass graft   . Abdominal aortic aneurysm repair   . Aortobifemoral bypass graft in the past   . Icd insertion     ICD Medtronic    Medications:  Scheduled:   . allopurinol  300 mg Oral q1800  . ALPRAZolam  1 mg Oral QHS  . aspirin  81 mg Oral Daily  . diphenhydrAMINE  50 mg Oral QHS  . feeding supplement  237 mL Oral TID BM  . glipiZIDE  5 mg Oral Daily  . iron polysaccharides  150 mg Oral Daily  . isosorbide-hydrALAZINE  1 tablet Oral TID  . levalbuterol  2 puff Inhalation BID  . levofloxacin  250 mg Oral Q24H  . loratadine  10 mg Oral Daily  . nebivolol  2.5 mg Oral Daily  . oxyCODONE  10 mg Oral TID  . pantoprazole  40 mg Oral Daily  . polyethylene glycol  17 g Oral Daily  . ranolazine  500 mg Oral Daily  . senna-docusate  3 tablet Oral BID  . sertraline  100 mg Oral Daily  . simvastatin  20 mg Oral q1800  . [COMPLETED] sodium phosphate  1 enema Rectal Once  . Tamsulosin HCl  0.4 mg Oral Daily  . [DISCONTINUED] furosemide  20 mg Intravenous Q12H    Allergies:  Allergies  Allergen Reactions  . Codeine     REACTION: "makes me crazy"  . Inderal (Propranolol) Other (See Comments)    unknown  . Sulfamethoxazole W-Trimethoprim     Family History  Problem Relation Age of Onset  . COPD Sister   . Heart disease Sister   . Alzheimer's disease Sister   . Lung cancer Sister   . Heart disease Brother   . Alzheimer's disease Brother   . Alzheimer's disease Mother     Social History:   reports that he quit smoking about 31 years ago. His smoking use included Cigarettes. He has a 7.5 pack-year smoking history. His smokeless tobacco use includes Chew. He reports that he drinks alcohol. He reports that he does not use illicit drugs.   Positive for suprapubic/pelvic discomfort, scrotal/testicular pain, penile discomfort, weight gain, recent confusion. Physical Exam:  Vital signs in last 24 hours: Temp:  [98.2 F (36.8 C)-99.6 F (37.6 C)] 98.4 F (36.9 C) (12/06 1443) Pulse Rate:  [70-73] 73  (12/06 1443) Resp:  [16-18] 16  (12/06 1443) BP: (130-141)/(65-84) 139/84 mmHg (12/06 1443) SpO2:  [91 %-94 %] 93 % (12/06 1443)  Constitutional: Vital signs reviewed. WD WN in NAD Head: Normocephalic and atraumatic   Eyes: PERRL, No scleral icterus.  Neck: Supple No  Gross JVD, mass, thyromegaly, or carotid bruit present.  Cardiovascular: RRR Pulmonary/Chest: Normal effort Abdominal: Soft. Non-tender, non-distended, bowel sounds are normal, no masses, organomegaly, or guarding present.  Genitourinary:indwelling Foley catheter. This is draining clear urine. The patient does have some testicular and epididymal sensitivity bilaterally. There is no evidence of any significant testicular enlargement or severe induration. No evidence of scrotal abscess. Neurological: Grossly non-focal.  Skin: Warm,very dry and intact. No rash, cyanosis   Laboratory Data:  Results for orders placed during the hospital encounter of 01/25/12 (from the past 72 hour(s))  CBC WITH DIFFERENTIAL     Status: Abnormal   Collection Time   01/26/12  5:20 AM      Component Value Range Comment   WBC 6.7  4.0 - 10.5 K/uL    RBC 3.84 (*) 4.22 - 5.81 MIL/uL    Hemoglobin 11.6 (*) 13.0 - 17.0 g/dL    HCT 16.1 (*) 09.6 - 52.0 %    MCV 98.4  78.0 - 100.0 fL    MCH 30.2  26.0 - 34.0 pg    MCHC 30.7  30.0 - 36.0 g/dL    RDW 04.5 (*) 40.9 - 15.5 %    Platelets 181  150 - 400 K/uL    Neutrophils Relative 70  43 - 77 %     Neutro Abs 4.7  1.7 - 7.7 K/uL    Lymphocytes Relative 19  12 - 46 %    Lymphs Abs 1.3  0.7 - 4.0 K/uL    Monocytes Relative 9  3 - 12 %    Monocytes Absolute 0.6  0.1 - 1.0 K/uL    Eosinophils Relative 2  0 -  5 %    Eosinophils Absolute 0.1  0.0 - 0.7 K/uL    Basophils Relative 0  0 - 1 %    Basophils Absolute 0.0  0.0 - 0.1 K/uL   COMPREHENSIVE METABOLIC PANEL     Status: Abnormal   Collection Time   01/26/12  5:20 AM      Component Value Range Comment   Sodium 137  135 - 145 mEq/L    Potassium 3.8  3.5 - 5.1 mEq/L    Chloride 95 (*) 96 - 112 mEq/L    CO2 32  19 - 32 mEq/L    Glucose, Bld 153 (*) 70 - 99 mg/dL    BUN 33 (*) 6 - 23 mg/dL    Creatinine, Ser 1.61 (*) 0.50 - 1.35 mg/dL    Calcium 9.3  8.4 - 09.6 mg/dL    Total Protein 6.6  6.0 - 8.3 g/dL    Albumin 3.3 (*) 3.5 - 5.2 g/dL    AST 24  0 - 37 U/L    ALT 14  0 - 53 U/L    Alkaline Phosphatase 126 (*) 39 - 117 U/L    Total Bilirubin 0.5  0.3 - 1.2 mg/dL    GFR calc non Af Amer 40 (*) >90 mL/min    GFR calc Af Amer 46 (*) >90 mL/min   CBC     Status: Abnormal   Collection Time   01/27/12  4:55 AM      Component Value Range Comment   WBC 7.6  4.0 - 10.5 K/uL    RBC 3.15 (*) 4.22 - 5.81 MIL/uL    Hemoglobin 9.8 (*) 13.0 - 17.0 g/dL    HCT 04.5 (*) 40.9 - 52.0 %    MCV 96.2  78.0 - 100.0 fL    MCH 31.1  26.0 - 34.0 pg    MCHC 32.3  30.0 - 36.0 g/dL    RDW 81.1  91.4 - 78.2 %    Platelets 165  150 - 400 K/uL   BASIC METABOLIC PANEL     Status: Abnormal   Collection Time   01/27/12  4:55 AM      Component Value Range Comment   Sodium 137  135 - 145 mEq/L    Potassium 3.8  3.5 - 5.1 mEq/L    Chloride 97  96 - 112 mEq/L    CO2 30  19 - 32 mEq/L    Glucose, Bld 104 (*) 70 - 99 mg/dL    BUN 45 (*) 6 - 23 mg/dL    Creatinine, Ser 9.56 (*) 0.50 - 1.35 mg/dL    Calcium 9.1  8.4 - 21.3 mg/dL    GFR calc non Af Amer 28 (*) >90 mL/min    GFR calc Af Amer 32 (*) >90 mL/min   BASIC METABOLIC PANEL     Status: Abnormal    Collection Time   01/28/12  5:23 AM      Component Value Range Comment   Sodium 137  135 - 145 mEq/L    Potassium 3.7  3.5 - 5.1 mEq/L    Chloride 96  96 - 112 mEq/L    CO2 33 (*) 19 - 32 mEq/L    Glucose, Bld 71  70 - 99 mg/dL    BUN 51 (*) 6 - 23 mg/dL    Creatinine, Ser 0.86 (*) 0.50 - 1.35 mg/dL    Calcium 9.3  8.4 - 57.8 mg/dL    GFR calc  non Af Amer 26 (*) >90 mL/min    GFR calc Af Amer 30 (*) >90 mL/min   CBC     Status: Abnormal   Collection Time   01/28/12  5:23 AM      Component Value Range Comment   WBC 8.2  4.0 - 10.5 K/uL    RBC 3.33 (*) 4.22 - 5.81 MIL/uL    Hemoglobin 10.2 (*) 13.0 - 17.0 g/dL    HCT 40.9 (*) 81.1 - 52.0 %    MCV 95.5  78.0 - 100.0 fL    MCH 30.6  26.0 - 34.0 pg    MCHC 32.1  30.0 - 36.0 g/dL    RDW 91.4  78.2 - 95.6 %    Platelets 165  150 - 400 K/uL    Recent Results (from the past 240 hour(s))  URINE CULTURE     Status: Normal   Collection Time   01/25/12  8:21 AM      Component Value Range Status Comment   Specimen Description URINE, CATHETERIZED   Final    Special Requests NONE   Final    Culture  Setup Time 01/25/2012 14:06   Final    Colony Count NO GROWTH   Final    Culture NO GROWTH   Final    Report Status 01/26/2012 FINAL   Final    Creatinine:  Basename 01/28/12 0523 01/27/12 0455 01/26/12 0520 01/25/12 0745  CREATININE 2.21* 2.07* 1.54* 1.57*   Baseline Creatinine  Impression/Assessment:  Questionable mild epididymitis. Urine culture was actually negative. Ultrasound did suggest some increased edema and blood flow increased to the epididymis. Clinical exam is positive for tenderness but otherwise not particularly impressive for epididymal induration or enlargement. He should continue with a 7-10 day course of antibiotic therapy. His pain at this point seems to be more related to bladder spasm and hopefully we can get his Foley catheter out. We will start him on tamsulosin in hope to get his Foley catheter out in the next  24-48 hours.  Plan:  As above  Zubayr Bednarczyk S 01/28/2012, 4:15 PM

## 2012-01-28 NOTE — Progress Notes (Signed)
[facility] PROGRESS NOTE  Higinio Plan NGE:[id number] DOB: [birth year] DOA: 01/25/2012 PCP: Kimber Relic, MD   Brief history: 78 year old gentleman who began having confusion on Saturday night into Sunday morning. The patient currently receives home hospice at his ALF.  Hospice noted increased 7 pounds gain since July and patient was given extra doses of furosemide on Saturday and on Sunday. The patient also had bibasilar crackles. The patient had difficulty getting to the bathroom in time. Because the patient currently to the bathroom in time to urinate, a Foley catheter was placed on Monday by the hospice nurse. Since she was placed, there was notation of hematuria, likely from trauma. Unfortunately, the patient continued to worsen with his agitation on Monday. As a result, the hospice nurse went to visit him on Monday night and recommended that the patient go to the emergency department. There was no history of urinary retention. In emergency department, patient complaint of genital and inguinal pain which prompted scrotal ultrasound and diagnosis of epididymitis. Since last night, the patient has no confusion according to the patient's daughter at the bedside.  Assessment/Plan: Acute epididymitis -likely caused by Enterobacteriaceae, coliforms -On exam, appears to be mild -Continue levofloxacin - Scrotal support. - Urology consult  Acute encephalopathy fluctuating Most likely due to UTI Continue with levaquin  CKD stage III Cr is worse today. Will hold the lasix.  Hypoxia -This appears to have happened after the patient was given Dilaudid in ED -Patient is currently stable on nasal cannula -Patient normally is on 2 L at home--which he only intermittently wears  Ischemic cardiomyopathy -Will start on IV lasix 20 mg q 12 hrs. -Ejection fraction 25-30% s/p AICD  Diabetes mellitus type 2 -Will discontinue glipizide at this time given worsening renal failure and increasing  of the medication -Continue NovoLog sliding scale for now     Family Communication:   Daughter at beside, discussed with daughter. Disposition Plan:   snf    Antibiotics:  Levofloxacin December 3>>>    Procedures/Studies: US Scrotum  01/25/2012  *RADIOLOGY REPORT*  Scrotal ultrasound; scrotal Doppler ultrasound  History:  Left-sided scrotal pain  Findings:  Real-time and Doppler interrogation of the scrotal contents was performed.  Both testes are normal in size and contour.  There is no intratesticular mass.  Color flow is seen in both testes.  Both testes demonstrate low resistance wave forms.  The peak systolic velocity in the right testis is  4.2 cm/sec with an end- diastolic velocity of 1.3 cm/sec.  The peak systolic velocity in the left testis is 4.8 cm/sec an end diastolic velocity of 1.4 cm/sec.  Venous outflow is demonstrated bilaterally.  The left epididymis appears somewhat edematous and hyperemic.  The right epididymis appears normal. There are no extratesticular masses on either side.  There is a small hydrocele on the left.  There is no appreciable hydrocele on the right.  There is no scrotal abscess or wall thickening.  There are small varicoceles bilaterally.  Conclusion:  Evidence of left epididymitis.  Small varicoceles. Small left hydrocele.  No intratesticular mass or torsion.   Original Report Authenticated By: Bretta Bang, M.D.    Ct Abdomen Pelvis W Contrast  01/25/2012  *RADIOLOGY REPORT*  Clinical Data: Abdominal pain, hematuria, recent AAA repair  CT ABDOMEN AND PELVIS WITH CONTRAST  Technique:  Multidetector CT imaging of the abdomen and pelvis was performed following the standard protocol during bolus administration of intravenous contrast.  Contrast: 80mL OMNIPAQUE IOHEXOL 300 MG/ML  SOLN  the  Comparison: 11/06/2011  Findings: Cardiomegaly again noted.  Cardiac pacemaker leads are partially visualized.  Bilateral trace pleural effusion with bilateral basilar  posterior atelectasis.  Trace perisplenic ascites.  No calcified gallstones are noted within gallbladder.  Enhanced liver is unremarkable.  Partially atrophic pancreas again noted. The adrenal glands are unremarkable.  Kidneys are symmetrical in size and enhancement.  No hydronephrosis or hydroureter.  Extensive atherosclerotic calcifications SMA origin, celiac trunk origin, splenic artery abdominal aorta and the iliac arteries again noted. Stable postsurgical changes distal abdominal aorta.  No small bowel obstruction.  No ascites or free air.  No adenopathy.  Stable postsurgical changes bilateral common iliac artery.  Left colon diverticula are noted without evidence of acute diverticulitis.  Multiple sigmoid colon diverticula are noted without evidence of acute diverticulitis.  Again noted right inguinal hernia containing fat and short segment of small bowel without evidence for acute complication.  There is a Foley catheter within urinary bladder.  The urinary bladder is decompressed. Small amount of air in anterior aspect urinary bladder is probable post instrumentation.  Mild enlarged prostate gland.  Sagittal images of the spine shows stable osteopenia and mild degenerative changes.  Small left inguinal hernia containing fat without evidence of acute complication.  No destructive bony lesions are noted within pelvis. No pericecal inflammation.  Normal appendix is clearly visualized.  Delayed renal images shows bilateral renal symmetrical excretion.   IMPRESSION: 1.  Stable postsurgical changes distal abdominal aorta and bilateral common iliac artery. 2.  No small bowel obstruction. 3.  Bilateral small pleural effusion with bilateral basilar posterior atelectasis. 4.  Again noted extensive atherosclerotic vascular calcifications. 5.  Normal appendix.  No pericecal inflammation. 6.  Left colon and sigmoid colon diverticula are noted without evidence of acute diverticulitis. 7.  There is a Foley catheter within  decompressed urinary bladder. Mild enlarged prostate gland. 8.  Again noted small right inguinal hernia containing fat and small bowel without evidence of acute complication.   Original Report Authenticated By: Natasha Mead, M.D.    [place] Art/ven Flow Abd Pelv Doppler  01/25/2012  *RADIOLOGY REPORT*  Scrotal ultrasound and scrotal Doppler ultrasound reports are combined into a single dictation.   Original Report Authenticated By: Bretta Bang, M.D.        Subjective: Patient is  still having testicular  pain while sitting in recliner. PT recommends SNF  Objective: Filed Vitals:   01/27/12 2200 01/28/12 0720 01/28/12 1212 01/28/12 1443  BP: 130/65 141/75  139/84  Pulse: 70 73  73  Temp: 98.2 F (36.8 C) 99.6 F (37.6 C)  98.4 F (36.9 C)  TempSrc: Oral Oral  Oral  Resp: 18 18  16   Height:      Weight:      SpO2: 91% 94% 94% 93%    Intake/Output Summary (Last 24 hours) at 01/28/12 1601 Last data filed at 01/28/12 1443  Gross per 24 hour  Intake    660 ml  Output   2401 ml  Net  -1741 ml   Weight change:  Exam:   General:  Pt is alert, follows commands appropriately, not in acute distress  HEENT: No icterus, No thrush, [place]/AT  Cardiovascular: RRR, S1/S2, no rubs, no gallops  Respiratory: Bibasilar crackles but no wheezes or rhonchi. Good air movement.  Abdomen: Soft/+BS, non tender, non distended, no guarding  Scrotum: No erythema, positive testicular tenderness on palpation  Extremities: No edema, No lymphangitis, No petechiae, No rashes, no synovitis  Data Reviewed:  Basic Metabolic Panel:  Lab 01/28/12 1610 01/27/12 0455 01/26/12 0520 01/25/12 0745  NA 137 137 137 136  K 3.7 3.8 3.8 3.5  CL 96 97 95* 92*  CO2 33* 30 32 36*  GLUCOSE 71 104* 153* 107*  BUN 51* 45* 33* 34*  CREATININE 2.21* 2.07* 1.54* 1.57*  CALCIUM 9.3 9.1 9.3 9.8  MG -- -- -- --  PHOS -- -- -- --   Liver Function Tests:  Lab 01/26/12 0520 01/25/12 0745  AST 24 25  ALT 14 14   ALKPHOS 126* 117  BILITOT 0.5 0.6  PROT 6.6 6.7  ALBUMIN 3.3* 3.5   No results found for this basename: LIPASE:5,AMYLASE:5 in the last 168 hours No results found for this basename: AMMONIA:5 in the last 168 hours CBC:  Lab 01/28/12 0523 01/27/12 0455 01/26/12 0520 01/25/12 0745  WBC 8.2 7.6 6.7 8.8  NEUTROABS -- -- 4.7 6.3  HGB 10.2* 9.8* 11.6* 11.7*  HCT 31.8* 30.3* 37.8* 35.8*  MCV 95.5 96.2 98.4 95.5  PLT 165 165 181 178   Cardiac Enzymes: No results found for this basename: CKTOTAL:5,CKMB:5,CKMBINDEX:5,TROPONINI:5 in the last 168 hours BNP: No components found with this basename: POCBNP:5 CBG: No results found for this basename: GLUCAP:5 in the last 168 hours  Recent Results (from the past 240 hour(s))  URINE CULTURE     Status: Normal   Collection Time   01/25/12  8:21 AM      Component Value Range Status Comment   Specimen Description URINE, CATHETERIZED   Final    Special Requests NONE   Final    Culture  Setup Time 01/25/2012 14:06   Final    Colony Count NO GROWTH   Final    Culture NO GROWTH   Final    Report Status 01/26/2012 FINAL   Final      Scheduled Meds:    . allopurinol  300 mg Oral q1800  . ALPRAZolam  1 mg Oral QHS  . aspirin  81 mg Oral Daily  . diphenhydrAMINE  50 mg Oral QHS  . feeding supplement  237 mL Oral TID BM  . furosemide  20 mg Intravenous Q12H  . glipiZIDE  5 mg Oral Daily  . iron polysaccharides  150 mg Oral Daily  . isosorbide-hydrALAZINE  1 tablet Oral TID  . levalbuterol  2 puff Inhalation BID  . levofloxacin  250 mg Oral Q24H  . loratadine  10 mg Oral Daily  . nebivolol  2.5 mg Oral Daily  . oxyCODONE  10 mg Oral TID  . pantoprazole  40 mg Oral Daily  . polyethylene glycol  17 g Oral Daily  . ranolazine  500 mg Oral Daily  . senna-docusate  3 tablet Oral BID  . sertraline  100 mg Oral Daily  . simvastatin  20 mg Oral q1800  . [COMPLETED] sodium phosphate  1 enema Rectal Once  . Tamsulosin HCl  0.4 mg Oral Daily    Continuous Infusions:    Meredeth Ide, MD [facility] Pager [phone]  If 7PM-7AM, please contact night-coverage [email or web address] Password [email or web address] 01/28/2012, 4:01 PM   LOS: 3 days

## 2012-01-28 NOTE — Progress Notes (Signed)
Pt lying in bed with daughter at bedside.  Pt reported to be more confused today with at one point pt even reporting seeing his wife in the room when she was at home. Intermittent abdominal pain.  No BM since 01/22/12.  Daughter reports that the pt is to receive an enema today and then be transferred to the Palliative Care Unit.  Family is seeking SNF placement for Rehab at time of hospital discharge.  Daughter concerned about Hospice not being involved in Pts care.  Writer made [facility] aware that a facility MD can write an order for [facility]iative consult and our NP could follow the pt while he is in a SNF utilizing his Medicare days.   Reviewed chart.  This is a hospice related admission.  Please contact [facility] at [phone] with any Pt movements.  Elijah Birk RN, [facility]

## 2012-01-29 LAB — BASIC METABOLIC PANEL
Calcium: 9.4 mg/dL (ref 8.4–10.5)
GFR calc non Af Amer: 30 mL/min — ABNORMAL LOW (ref >90)
Sodium: 137 mEq/L (ref 135–145)

## 2012-01-29 NOTE — Progress Notes (Signed)
Patient was having difficulty swallowing during breakfast. He had a regular diet. He had no problems with the liquids but difficulty with the solids. Hospice nurse suggest swallow evaluation and family is interested in palliative care.

## 2012-01-29 NOTE — Progress Notes (Signed)
[facility] PROGRESS NOTE  Higinio Plan ZOX:[id number] DOB: [birth year] DOA: 01/25/2012 PCP: Kimber Relic, MD   Brief history: 78 year old gentleman who began having confusion on Saturday night into Sunday morning. The patient currently receives home hospice at his ALF.  Hospice noted increased 7 pounds gain since July and patient was given extra doses of furosemide on Saturday and on Sunday. The patient also had bibasilar crackles. The patient had difficulty getting to the bathroom in time. Because the patient currently to the bathroom in time to urinate, a Foley catheter was placed on Monday by the hospice nurse. Since she was placed, there was notation of hematuria, likely from trauma. Unfortunately, the patient continued to worsen with his agitation on Monday. As a result, the hospice nurse went to visit him on Monday night and recommended that the patient go to the emergency department. There was no history of urinary retention. In emergency department, patient complaint of genital and inguinal pain which prompted scrotal ultrasound and diagnosis of epididymitis. Since last night, the patient has no confusion according to the patient's daughter at the bedside.  Assessment/Plan: Acute epididymitis -likely caused by Enterobacteriaceae, coliforms -On exam, appears to be mild -Continue levofloxacin - Scrotal support. - Urology consult  Acute encephalopathy fluctuating Most likely due to UTI Continue with levaquin  CKD stage III Cr is improving. Continue to hold the lasix.  Hypoxia -This appears to have happened after the patient was given Dilaudid in ED -Patient is currently stable on nasal cannula -Patient normally is on 2 L at home--which he only intermittently wears  Ischemic cardiomyopathy -Ejection fraction 25-30% s/p AICD  Diabetes mellitus type 2 -Will discontinue glipizide at this time given worsening renal failure and increasing of the medication -Continue NovoLog  sliding scale for now     Family Communication:   Daughter at beside, discussed with son and son in Social worker. Disposition Plan:   snf    Antibiotics:  Levofloxacin December 3>>>    Procedures/Studies: US Scrotum  01/25/2012  *RADIOLOGY REPORT*  Scrotal ultrasound; scrotal Doppler ultrasound  History:  Left-sided scrotal pain  Findings:  Real-time and Doppler interrogation of the scrotal contents was performed.  Both testes are normal in size and contour.  There is no intratesticular mass.  Color flow is seen in both testes.  Both testes demonstrate low resistance wave forms.  The peak systolic velocity in the right testis is  4.2 cm/sec with an end- diastolic velocity of 1.3 cm/sec.  The peak systolic velocity in the left testis is 4.8 cm/sec an end diastolic velocity of 1.4 cm/sec.  Venous outflow is demonstrated bilaterally.  The left epididymis appears somewhat edematous and hyperemic.  The right epididymis appears normal. There are no extratesticular masses on either side.  There is a small hydrocele on the left.  There is no appreciable hydrocele on the right.  There is no scrotal abscess or wall thickening.  There are small varicoceles bilaterally.  Conclusion:  Evidence of left epididymitis.  Small varicoceles. Small left hydrocele.  No intratesticular mass or torsion.   Original Report Authenticated By: Bretta Bang, M.D.    Ct Abdomen Pelvis W Contrast  01/25/2012  *RADIOLOGY REPORT*  Clinical Data: Abdominal pain, hematuria, recent AAA repair  CT ABDOMEN AND PELVIS WITH CONTRAST  Technique:  Multidetector CT imaging of the abdomen and pelvis was performed following the standard protocol during bolus administration of intravenous contrast.  Contrast: 80mL OMNIPAQUE IOHEXOL 300 MG/ML  SOLN the  Comparison: 11/06/2011  Findings:  Cardiomegaly again noted.  Cardiac pacemaker leads are partially visualized.  Bilateral trace pleural effusion with bilateral basilar posterior atelectasis.  Trace  perisplenic ascites.  No calcified gallstones are noted within gallbladder.  Enhanced liver is unremarkable.  Partially atrophic pancreas again noted. The adrenal glands are unremarkable.  Kidneys are symmetrical in size and enhancement.  No hydronephrosis or hydroureter.  Extensive atherosclerotic calcifications SMA origin, celiac trunk origin, splenic artery abdominal aorta and the iliac arteries again noted. Stable postsurgical changes distal abdominal aorta.  No small bowel obstruction.  No ascites or free air.  No adenopathy.  Stable postsurgical changes bilateral common iliac artery.  Left colon diverticula are noted without evidence of acute diverticulitis.  Multiple sigmoid colon diverticula are noted without evidence of acute diverticulitis.  Again noted right inguinal hernia containing fat and short segment of small bowel without evidence for acute complication.  There is a Foley catheter within urinary bladder.  The urinary bladder is decompressed. Small amount of air in anterior aspect urinary bladder is probable post instrumentation.  Mild enlarged prostate gland.  Sagittal images of the spine shows stable osteopenia and mild degenerative changes.  Small left inguinal hernia containing fat without evidence of acute complication.  No destructive bony lesions are noted within pelvis. No pericecal inflammation.  Normal appendix is clearly visualized.  Delayed renal images shows bilateral renal symmetrical excretion.   IMPRESSION: 1.  Stable postsurgical changes distal abdominal aorta and bilateral common iliac artery. 2.  No small bowel obstruction. 3.  Bilateral small pleural effusion with bilateral basilar posterior atelectasis. 4.  Again noted extensive atherosclerotic vascular calcifications. 5.  Normal appendix.  No pericecal inflammation. 6.  Left colon and sigmoid colon diverticula are noted without evidence of acute diverticulitis. 7.  There is a Foley catheter within decompressed urinary bladder.  Mild enlarged prostate gland. 8.  Again noted small right inguinal hernia containing fat and small bowel without evidence of acute complication.   Original Report Authenticated By: Natasha Mead, M.D.    [place] Art/ven Flow Abd Pelv Doppler  01/25/2012  *RADIOLOGY REPORT*  Scrotal ultrasound and scrotal Doppler ultrasound reports are combined into a single dictation.   Original Report Authenticated By: Bretta Bang, M.D.        Subjective: Patient is  Feeling better, started on ditropan by urology.  Objective: Filed Vitals:   01/28/12 2101 01/28/12 2126 01/29/12 0527 01/29/12 0905  BP: 130/70  113/48   Pulse: 80  83   Temp: 100.2 F (37.9 C)  98.5 F (36.9 C)   TempSrc: Oral  Oral   Resp: 20 15 18    Height:      Weight:      SpO2: 95%  87% 96%    Intake/Output Summary (Last 24 hours) at 01/29/12 1544 Last data filed at 01/29/12 0527  Gross per 24 hour  Intake      0 ml  Output   1175 ml  Net  -1175 ml   Weight change:  Exam:   General:  Pt is alert, follows commands appropriately, not in acute distress  HEENT: No icterus, No thrush, [place]/AT  Cardiovascular: RRR, S1/S2, no rubs, no gallops  Respiratory: Bibasilar crackles but no wheezes or rhonchi. Good air movement.  Abdomen: Soft/+BS, non tender, non distended, no guarding  Scrotum: No erythema, positive testicular tenderness on palpation  Extremities: No edema, No lymphangitis, No petechiae, No rashes, no synovitis  Data Reviewed: Basic Metabolic Panel:  Lab 01/29/12 1610 01/28/12 0523 01/27/12 0455 01/26/12  0520 01/25/12 0745  NA 137 137 137 137 136  K 3.3* 3.7 3.8 3.8 3.5  CL 94* 96 97 95* 92*  CO2 31 33* 30 32 36*  GLUCOSE 73 71 104* 153* 107*  BUN 56* 51* 45* 33* 34*  CREATININE 1.96* 2.21* 2.07* 1.54* 1.57*  CALCIUM 9.4 9.3 9.1 9.3 9.8  MG -- -- -- -- --  PHOS -- -- -- -- --   Liver Function Tests:  Lab 01/26/12 0520 01/25/12 0745  AST 24 25  ALT 14 14  ALKPHOS 126* 117  BILITOT 0.5 0.6  PROT  6.6 6.7  ALBUMIN 3.3* 3.5   No results found for this basename: LIPASE:5,AMYLASE:5 in the last 168 hours No results found for this basename: AMMONIA:5 in the last 168 hours CBC:  Lab 01/28/12 0523 01/27/12 0455 01/26/12 0520 01/25/12 0745  WBC 8.2 7.6 6.7 8.8  NEUTROABS -- -- 4.7 6.3  HGB 10.2* 9.8* 11.6* 11.7*  HCT 31.8* 30.3* 37.8* 35.8*  MCV 95.5 96.2 98.4 95.5  PLT 165 165 181 178   Cardiac Enzymes: No results found for this basename: CKTOTAL:5,CKMB:5,CKMBINDEX:5,TROPONINI:5 in the last 168 hours BNP: No components found with this basename: POCBNP:5 CBG: No results found for this basename: GLUCAP:5 in the last 168 hours  Recent Results (from the past 240 hour(s))  URINE CULTURE     Status: Normal   Collection Time   01/25/12  8:21 AM      Component Value Range Status Comment   Specimen Description URINE, CATHETERIZED   Final    Special Requests NONE   Final    Culture  Setup Time 01/25/2012 14:06   Final    Colony Count NO GROWTH   Final    Culture NO GROWTH   Final    Report Status 01/26/2012 FINAL   Final      Scheduled Meds:    . allopurinol  300 mg Oral q1800  . ALPRAZolam  1 mg Oral QHS  . aspirin  81 mg Oral Daily  . diphenhydrAMINE  50 mg Oral QHS  . feeding supplement  237 mL Oral TID BM  . glipiZIDE  5 mg Oral Daily  . iron polysaccharides  150 mg Oral Daily  . isosorbide-hydrALAZINE  1 tablet Oral TID  . levalbuterol  2 puff Inhalation BID  . levofloxacin  250 mg Oral Q24H  . loratadine  10 mg Oral Daily  . nebivolol  2.5 mg Oral Daily  . oxyCODONE  10 mg Oral TID  . pantoprazole  40 mg Oral Daily  . polyethylene glycol  17 g Oral Daily  . ranolazine  500 mg Oral Daily  . senna-docusate  3 tablet Oral BID  . sertraline  100 mg Oral Daily  . simvastatin  20 mg Oral q1800  . Tamsulosin HCl  0.4 mg Oral Daily  . [DISCONTINUED] furosemide  20 mg Intravenous Q12H   Continuous Infusions:    Meredeth Ide, MD [facility] Pager  [phone]  If 7PM-7AM, please contact night-coverage [email or web address] Password [email or web address] 01/29/2012, 3:44 PM   LOS: 4 days

## 2012-01-29 NOTE — Progress Notes (Signed)
Patient ID: Dustin [name], male   DOB: [birth year], 78 y.o.   MRN: [id number]   Subjective: Patient reports the bladder spasms seem to be better on the Ditropan but have not resolved. Catheter continues to drain clear urine. Otherwise no significant clinical change.  Objective: Vital signs in last 24 hours: Temp:  [98.4 F (36.9 C)-100.2 F (37.9 C)] 98.5 F (36.9 C) (12/07 0527) Pulse Rate:  [73-83] 83  (12/07 0527) Resp:  [15-20] 18  (12/07 0527) BP: (113-139)/(48-84) 113/48 mmHg (12/07 0527) SpO2:  [87 %-96 %] 96 % (12/07 0905)  Intake/Output from previous day: 12/06 0701 - 12/07 0700 In: 420 [P.O.:420] Out: 1675 [Urine:1675] Intake/Output this shift:    Physical Exam:  Constitutional: Vital signs reviewed. WD WN in NAD   Pulmonary/Chest: Normal effort Abdominal: Soft. Non-tender, non-distended, bowel sounds are normal, no masses, organomegaly, or guarding present.  Genitourinary: No significant change. Extremities: No cyanosis or edema   Lab Results:  Basename 01/28/12 0523 01/27/12 0455  HGB 10.2* 9.8*  HCT 31.8* 30.3*   BMET  Basename 01/29/12 0650 01/28/12 0523  NA 137 137  K 3.3* 3.7  CL 94* 96  CO2 31 33*  GLUCOSE 73 71  BUN 56* 51*  CREATININE 1.96* 2.21*  CALCIUM 9.4 9.3   No results found for this basename: LABPT:3,INR:3 in the last 72 hours No results found for this basename: LABURIN:1 in the last 72 hours Results for orders placed during the hospital encounter of 01/25/12  URINE CULTURE     Status: Normal   Collection Time   01/25/12  8:21 AM      Component Value Range Status Comment   Specimen Description URINE, CATHETERIZED   Final    Special Requests NONE   Final    Culture  Setup Time 01/25/2012 14:06   Final    Colony Count NO GROWTH   Final    Culture NO GROWTH   Final    Report Status 01/26/2012 FINAL   Final     Studies/Results: Dg Chest 2 View  01/27/2012  *RADIOLOGY REPORT*  Clinical Data: Shortness of breath, cough, congestion   CHEST - 2 VIEW  Comparison: Portable chest x-ray of 07/30/2009  Findings: Moderate cardiomegaly is stable.  There does appear to be mild pulmonary vascular congestion present.  A defibrillator remains.  Median sternotomy sutures are noted.  IMPRESSION: Stable moderate cardiomegaly.  Question mild pulmonary vascular congestion.   Original Report Authenticated By: Dwyane Dee, M.D.     Assessment/Plan:   As outlined in the consultation note. I think we should try removing his Foley catheter tomorrow morning and monitoring his situation. His bladder spasms should resolve that the catheter can be removed. If his voiding is problematic than the Foley catheter can always be reinserted.   LOS: 4 days   Konstance Happel S 01/29/2012, 9:22 AM

## 2012-01-29 NOTE — Progress Notes (Addendum)
Room 1516 - Braheem Clauson - [facility] RN Visit-R.Daiana Vitiello RN  Related admission to [facility] diagnosis of CHF.  Pt is DNR code.    Pt alert & oriented, sittng up in lounge chair attempting to eat breakfast (scrambled eggs) with staff RN assistance.  Pt became quite choked attempting to clear his throat but unable to.  Repositioned pt to a 95 degree sitting position, encouraged smaller bites.  Pt took another bite after clearing his throat and again became choked and coughing to clear throat.  Suggested staff RN discuss with MD and see if SLP eval is warranted.  Son in law present in room and spent night with pt - who he says had an uneventful night.  Son in law states this am, pt awoke and had the bladder spasms -and pain - was medicated and within 30 min was comfortable.  Son in law states Urology orders include Foley removal tomorrow Sunday 12/8.  Staff RN reassured son in law there were options due to pt's increased weakness.   Patient's home medication list is on shadow chart.   Please call [facility] @ [phone]- ask for RN Liaison or after hours,ask for on-call RN with any hospice needs.    Thank you.  [name], RN  [facility]  Hospice Liaison

## 2012-01-30 LAB — BASIC METABOLIC PANEL
BUN: 56 mg/dL — ABNORMAL HIGH (ref 6–23)
Creatinine, Ser: 1.8 mg/dL — ABNORMAL HIGH (ref 0.50–1.35)
GFR calc Af Amer: 38 mL/min — ABNORMAL LOW (ref >90)
GFR calc non Af Amer: 33 mL/min — ABNORMAL LOW (ref >90)
Glucose, Bld: 153 mg/dL — ABNORMAL HIGH (ref 70–99)
Potassium: 3.6 mEq/L (ref 3.5–5.1)

## 2012-01-30 LAB — CBC
MCH: 30.6 pg (ref 26.0–34.0)
RBC: 3.46 MIL/uL — ABNORMAL LOW (ref 4.22–5.81)
RDW: 14.9 % (ref 11.5–15.5)
WBC: 6.7 10*3/uL (ref 4.0–10.5)

## 2012-01-30 MED ORDER — MORPHINE SULFATE (CONCENTRATE) 20 MG/ML PO SOLN
5.0000 mg | ORAL | Status: DC | PRN
  Administered 2012-02-01 (×2): 5 mg via ORAL
  Filled 2012-01-30 (×2): qty 1

## 2012-01-30 MED ORDER — CYCLOBENZAPRINE HCL 5 MG PO TABS
5.0000 mg | ORAL_TABLET | Freq: Three times a day (TID) | ORAL | Status: DC
  Administered 2012-01-30 (×3): 5 mg via ORAL
  Filled 2012-01-30 (×6): qty 1

## 2012-01-30 MED ORDER — GABAPENTIN 300 MG PO CAPS
300.0000 mg | ORAL_CAPSULE | Freq: Three times a day (TID) | ORAL | Status: DC
  Administered 2012-01-30 – 2012-01-31 (×4): 300 mg via ORAL
  Filled 2012-01-30 (×7): qty 1

## 2012-01-30 NOTE — Progress Notes (Signed)
Patient ID: Dustin [name], male   DOB: 03-15-1926, 78 y.o.   MRN: [id number]   Subjective: Patient reports having a good night. No obvious significant problems with bladder spasms. His Foley continues to be indwelling at this time.  Objective: Vital signs in last 24 hours: Temp:  [98.1 F (36.7 C)-98.3 F (36.8 C)] 98.3 F (36.8 C) (12/08 0600) Pulse Rate:  [59-60] 59  (12/08 0600) Resp:  [16] 16  (12/08 0600) BP: (110-124)/(52-56) 124/53 mmHg (12/08 0600) SpO2:  [91 %-96 %] 96 % (12/08 0600)  Intake/Output from previous day: 12/07 0701 - 12/08 0700 In: -  Out: 500 [Urine:500] Intake/Output this shift:    Physical Exam:  Constitutional: Vital signs reviewed. WD WN in NAD   No significant GU exam change.  Lab Results:  [facility] 01/28/12 0523  HGB 10.2*  HCT 31.8*   BMET  Basename 01/29/12 0650 01/28/12 0523  NA 137 137  K 3.3* 3.7  CL 94* 96  CO2 31 33*  GLUCOSE 73 71  BUN 56* 51*  CREATININE 1.96* 2.21*  CALCIUM 9.4 9.3   No results found for this basename: LABPT:3,INR:3 in the last 72 hours No results found for this basename: LABURIN:1 in the last 72 hours Results for orders placed during the hospital encounter of 01/25/12  URINE CULTURE     Status: Normal   Collection Time   01/25/12  8:21 AM      Component Value Range Status Comment   Specimen Description URINE, CATHETERIZED   Final    Special Requests NONE   Final    Culture  Setup Time 01/25/2012 14:06   Final    Colony Count NO GROWTH   Final    Culture NO GROWTH   Final    Report Status 01/26/2012 FINAL   Final     Studies/Results: No results found.  Assessment/Plan:   The patient is to have a voiding trial this morning. We will check on the status later today. Patient may need postvoid residual checked by bladder scan.   LOS: 5 days   Radwan Cowley S 01/30/2012, 7:08 AM

## 2012-01-30 NOTE — Progress Notes (Signed)
[facility] PROGRESS NOTE  Dustin [name] ZOX:[id number] DOB: 22-Jun-1926 DOA: 01/25/2012 PCP: Kimber Relic, MD   Brief history: 78 year old gentleman who began having confusion on Saturday night into Sunday morning. The patient currently receives home hospice at his ALF.  Hospice noted increased 7 pounds gain since July and patient was given extra doses of furosemide on Saturday and on Sunday. The patient also had bibasilar crackles. The patient had difficulty getting to the bathroom in time. Because the patient currently to the bathroom in time to urinate, a Foley catheter was placed on Monday by the hospice nurse. Since she was placed, there was notation of hematuria, likely from trauma. Unfortunately, the patient continued to worsen with his agitation on Monday. As a result, the hospice nurse went to visit him on Monday night and recommended that the patient go to the emergency department. There was no history of urinary retention. In emergency department, patient complaint of genital and inguinal pain which prompted scrotal ultrasound and diagnosis of epididymitis.   Assessment/[name]: Acute epididymitis -likely caused by Enterobacteriaceae, coliforms -On exam, appears to be mild -Continue levofloxacin - Scrotal support. - Urology following  Acute encephalopathy fluctuating Most likely due to UTI Continue with levaquin  CKD stage III Cr is improving. Continue to hold the lasix.  Hypoxia -This appears to have happened after the patient was given Dilaudid in ED -Patient is currently stable on nasal cannula -Patient normally is on 2 L at home--which he only intermittently wears  Ischemic cardiomyopathy -Ejection fraction 25-30% s/p AICD  Diabetes mellitus type 2 -Will discontinue glipizide at this time given worsening renal failure and increasing of the medication -Continue NovoLog sliding scale for now  Pelvic pain Mostly due to bladder spasms Also has degenerative disc  disease We'll start him on Neurontin 300 by mouth 3 times a day Flexeril 5 mg by mouth 3 times a day for the muscle spasms    Family Communication:   Daughter at beside, discussed with son and son in Social worker. Disposition [name]:   snf    Antibiotics:  Levofloxacin December 3>>>    Procedures/Studies: US Scrotum  01/25/2012  *RADIOLOGY REPORT*  Scrotal ultrasound; scrotal Doppler ultrasound  History:  Left-sided scrotal pain  Findings:  Real-time and Doppler interrogation of the scrotal contents was performed.  Both testes are normal in size and contour.  There is no intratesticular mass.  Color flow is seen in both testes.  Both testes demonstrate low resistance wave forms.  The peak systolic velocity in the right testis is  4.2 cm/sec with an end- diastolic velocity of 1.3 cm/sec.  The peak systolic velocity in the left testis is 4.8 cm/sec an end diastolic velocity of 1.4 cm/sec.  Venous outflow is demonstrated bilaterally.  The left epididymis appears somewhat edematous and hyperemic.  The right epididymis appears normal. There are no extratesticular masses on either side.  There is a small hydrocele on the left.  There is no appreciable hydrocele on the right.  There is no scrotal abscess or wall thickening.  There are small varicoceles bilaterally.  Conclusion:  Evidence of left epididymitis.  Small varicoceles. Small left hydrocele.  No intratesticular mass or torsion.   Original Report Authenticated By: Bretta Bang, M.D.    Ct Abdomen Pelvis W Contrast  01/25/2012  *RADIOLOGY REPORT*  Clinical Data: Abdominal pain, hematuria, recent AAA repair  CT ABDOMEN AND PELVIS WITH CONTRAST  Technique:  Multidetector CT imaging of the abdomen and pelvis was performed following the standard  protocol during bolus administration of intravenous contrast.  Contrast: 80mL OMNIPAQUE IOHEXOL 300 MG/ML  SOLN the  Comparison: 11/06/2011  Findings: Cardiomegaly again noted.  Cardiac pacemaker leads are  partially visualized.  Bilateral trace pleural effusion with bilateral basilar posterior atelectasis.  Trace perisplenic ascites.  No calcified gallstones are noted within gallbladder.  Enhanced liver is unremarkable.  Partially atrophic pancreas again noted. The adrenal glands are unremarkable.  Kidneys are symmetrical in size and enhancement.  No hydronephrosis or hydroureter.  Extensive atherosclerotic calcifications SMA origin, celiac trunk origin, splenic artery abdominal aorta and the iliac arteries again noted. Stable postsurgical changes distal abdominal aorta.  No small bowel obstruction.  No ascites or free air.  No adenopathy.  Stable postsurgical changes bilateral common iliac artery.  Left colon diverticula are noted without evidence of acute diverticulitis.  Multiple sigmoid colon diverticula are noted without evidence of acute diverticulitis.  Again noted right inguinal hernia containing fat and short segment of small bowel without evidence for acute complication.  There is a Foley catheter within urinary bladder.  The urinary bladder is decompressed. Small amount of air in anterior aspect urinary bladder is probable post instrumentation.  Mild enlarged prostate gland.  Sagittal images of the spine shows stable osteopenia and mild degenerative changes.  Small left inguinal hernia containing fat without evidence of acute complication.  No destructive bony lesions are noted within pelvis. No pericecal inflammation.  Normal appendix is clearly visualized.  Delayed renal images shows bilateral renal symmetrical excretion.   IMPRESSION: 1.  Stable postsurgical changes distal abdominal aorta and bilateral common iliac artery. 2.  No small bowel obstruction. 3.  Bilateral small pleural effusion with bilateral basilar posterior atelectasis. 4.  Again noted extensive atherosclerotic vascular calcifications. 5.  Normal appendix.  No pericecal inflammation. 6.  Left colon and sigmoid colon diverticula are noted  without evidence of acute diverticulitis. 7.  There is a Foley catheter within decompressed urinary bladder. Mild enlarged prostate gland. 8.  Again noted small right inguinal hernia containing fat and small bowel without evidence of acute complication.   Original Report Authenticated By: Natasha Mead, M.D.    [place] Art/ven Flow Abd Pelv Doppler  01/25/2012  *RADIOLOGY REPORT*  Scrotal ultrasound and scrotal Doppler ultrasound reports are combined into a single dictation.   Original Report Authenticated By: Bretta Bang, M.D.        Subjective: Patient is   still having bladder spasms.Foley catheter to be out today   Objective: Filed Vitals:   01/29/12 0905 01/29/12 1628 01/29/12 2121 01/30/12 0600  BP:  112/56 110/52 124/53  Pulse:   60 59  Temp:   98.1 F (36.7 C) 98.3 F (36.8 C)  TempSrc:   Oral Oral  Resp:   16 16  Height:      Weight:      SpO2: 96%  91% 96%    Intake/Output Summary (Last 24 hours) at 01/30/12 1100 Last data filed at 01/30/12 0600  Gross per 24 hour  Intake      0 ml  Output    500 ml  Net   -500 ml   Weight change:  Exam:   General:  Pt is alert, follows commands appropriately, not in acute distress  HEENT: No icterus, No thrush, [place]/AT  Cardiovascular: RRR, S1/S2, no rubs, no gallops  Respiratory: Bibasilar crackles but no wheezes or rhonchi. Good air movement.  Abdomen: Soft/+BS, non tender, non distended, no guarding  Scrotum: No erythema, positive testicular tenderness on  palpation  Extremities: No edema, No lymphangitis, No petechiae, No rashes, no synovitis  Data Reviewed: Basic Metabolic Panel:  Lab 01/30/12 1610 01/29/12 0650 01/28/12 0523 01/27/12 0455 01/26/12 0520  NA 139 137 137 137 137  K 3.6 3.3* 3.7 3.8 3.8  CL 95* 94* 96 97 95*  CO2 35* 31 33* 30 32  GLUCOSE 153* 73 71 104* 153*  BUN 56* 56* 51* 45* 33*  CREATININE 1.80* 1.96* 2.21* 2.07* 1.54*  CALCIUM 9.6 9.4 9.3 9.1 9.3  MG -- -- -- -- --  PHOS -- -- -- -- --    Liver Function Tests:  Lab 01/26/12 0520 01/25/12 0745  AST 24 25  ALT 14 14  ALKPHOS 126* 117  BILITOT 0.5 0.6  PROT 6.6 6.7  ALBUMIN 3.3* 3.5   No results found for this basename: LIPASE:5,AMYLASE:5 in the last 168 hours No results found for this basename: AMMONIA:5 in the last 168 hours CBC:  Lab 01/30/12 0945 01/28/12 0523 01/27/12 0455 01/26/12 0520 01/25/12 0745  WBC 6.7 8.2 7.6 6.7 8.8  NEUTROABS -- -- -- 4.7 6.3  HGB 10.6* 10.2* 9.8* 11.6* 11.7*  HCT 32.9* 31.8* 30.3* 37.8* 35.8*  MCV 95.1 95.5 96.2 98.4 95.5  PLT 167 165 165 181 178   Cardiac Enzymes: No results found for this basename: CKTOTAL:5,CKMB:5,CKMBINDEX:5,TROPONINI:5 in the last 168 hours BNP: No components found with this basename: POCBNP:5 CBG: No results found for this basename: GLUCAP:5 in the last 168 hours  Recent Results (from the past 240 hour(s))  URINE CULTURE     Status: Normal   Collection Time   01/25/12  8:21 AM      Component Value Range Status Comment   Specimen Description URINE, CATHETERIZED   Final    Special Requests NONE   Final    Culture  Setup Time 01/25/2012 14:06   Final    Colony Count NO GROWTH   Final    Culture NO GROWTH   Final    Report Status 01/26/2012 FINAL   Final      Scheduled Meds:    . allopurinol  300 mg Oral q1800  . ALPRAZolam  1 mg Oral QHS  . aspirin  81 mg Oral Daily  . cyclobenzaprine  5 mg Oral TID  . diphenhydrAMINE  50 mg Oral QHS  . feeding supplement  237 mL Oral TID BM  . gabapentin  300 mg Oral TID PC  . glipiZIDE  5 mg Oral Daily  . iron polysaccharides  150 mg Oral Daily  . isosorbide-hydrALAZINE  1 tablet Oral TID  . levalbuterol  2 puff Inhalation BID  . levofloxacin  250 mg Oral Q24H  . loratadine  10 mg Oral Daily  . nebivolol  2.5 mg Oral Daily  . oxyCODONE  10 mg Oral TID  . pantoprazole  40 mg Oral Daily  . polyethylene glycol  17 g Oral Daily  . ranolazine  500 mg Oral Daily  . senna-docusate  3 tablet Oral BID  .  sertraline  100 mg Oral Daily  . simvastatin  20 mg Oral q1800  . Tamsulosin HCl  0.4 mg Oral Daily   Continuous Infusions:    Meredeth Ide, MD [facility] Pager [phone]  If 7PM-7AM, please contact night-coverage [email or web address] Password [email or web address] 01/30/2012, 11:00 AM   LOS: 5 days

## 2012-01-30 NOTE — Progress Notes (Signed)
Room 1326 - Dustin [name] - [facility] RN Visit-R.Mliss Wedin RN  Related admission to [facility] diagnosis of CHF.  Pt is DNR code.  Pt alert, continues with some confusion, sitting upright in bed, with complaints of pain in the lower abdomen.  Per dtr Dustin [name] present, pt did have BM and feels much better.  Pt has chronic back pain and attempts to reposition in the bed often.  Foley removed and pt using urinal successfully.  Dtr Dustin [name] has list of SNF's to visit in the am to make a decision on discharge facility.  Long discussion on revoking [facility] and calling when rehab complete.    Patient's home medication list is on shadow chart.   Please call [facility] @ [phone]- ask for RN Liaison or after hours,ask for on-call RN with any hospice needs.    Thank you.  [name], RN  [facility]  Hospice Liaison

## 2012-01-31 MED ORDER — DIPHENHYDRAMINE HCL 25 MG PO CAPS: 25.0000 mg | ORAL_CAPSULE | Freq: Four times a day (QID) | ORAL | Status: DC | PRN

## 2012-01-31 MED ORDER — FUROSEMIDE 40 MG PO TABS
40.0000 mg | ORAL_TABLET | Freq: Every day | ORAL | Status: DC
  Administered 2012-01-31 – 2012-02-02 (×3): 40 mg via ORAL
  Filled 2012-01-31 (×4): qty 1

## 2012-01-31 MED ORDER — RESOURCE THICKENUP CLEAR PO POWD
ORAL | Status: DC | PRN
  Filled 2012-01-31: qty 125

## 2012-01-31 NOTE — Progress Notes (Signed)
[facility] [place]: Sw note:   Pt in bed, eyes opened, he did recognize this Sw. Pt was able to say "hello" to Sw. He was able to grasp Sw hand, but unable to answer some questions like "how are you?" or "are you having any pain?". Son-in-law at bedside stated that Pt's pain was managed right now and he remains sleepy most of the time. Per family Pt's two dtrs are visiting area nursing homes today as the plan remains for Pt to go to SNF at discharge from hospital. Sw provided emotional support, active/reflective listening during visit. Sw will continue to follow for support during hospital stay.  Lorain Childes, LCSW, ACHP-SW

## 2012-01-31 NOTE — Progress Notes (Signed)
Pt lying in bed awake with CG feeding him lunch.  Pt excessively chewing pureed foods.  Takes a long time for him to swallow.  Daughters are reported to be touring different SNF's  Today.  He denied any current pain.  Breathing reported to feel a little better.   Reviewed chart.  Last VS were 98.5, 67, 24, 122/84.  Dysphagia 3 diet has been ordered.   Discharge Plan is for the pt to go to a SNF.  This is a hospice related admission.  Please contact [facility] at [phone] with any Pt movements and or discharge concerns.   Elijah Birk RN, [facility]

## 2012-01-31 NOTE — Progress Notes (Signed)
[facility] PROGRESS NOTE  Higinio Plan ZOX:[id number] DOB: 1926/10/01 DOA: 01/25/2012 PCP: Kimber Relic, MD   Brief history: 78 year old gentleman who began having confusion on Saturday night into Sunday morning. The patient currently receives home hospice at his ALF.  Hospice noted increased 7 pounds gain since July and patient was given extra doses of furosemide on Saturday and on Sunday. The patient also had bibasilar crackles. The patient had difficulty getting to the bathroom in time. Because the patient currently to the bathroom in time to urinate, a Foley catheter was placed on Monday by the hospice nurse. Since she was placed, there was notation of hematuria, likely from trauma. Unfortunately, the patient continued to worsen with his agitation on Monday. As a result, the hospice nurse went to visit him on Monday night and recommended that the patient go to the emergency department. There was no history of urinary retention. In emergency department, patient complaint of genital and inguinal pain which prompted scrotal ultrasound and diagnosis of epididymitis.   Assessment/Plan:  Acute epididymitis -likely caused by Enterobacteriaceae, coliforms -On exam, appears to be mild -Continue levofloxacin - Scrotal support. - Urology following  Acute encephalopathy fluctuating Most likely due to UTI Continue with levaquin  CKD stage III Cr is improving.   Hypoxia -This appears to have happened after the patient was given Dilaudid in ED -Patient is currently stable on nasal cannula -Patient normally is on 2 L at home--which he only intermittently wears  Ischemic cardiomyopathy -Ejection fraction 25-30% s/p AICD Will start back on lasix 40 mg po daily  Diabetes mellitus type 2 -Will discontinue glipizide at this time given worsening renal failure and increasing of the medication -Continue NovoLog sliding scale for now  Pelvic pain Mostly due to bladder spasms Also has  degenerative disc disease We'll start him on Neurontin 300 by mouth 3 times a day  Will d/c flexeril for excessive somnolence    Family Communication:   Daughter at beside, discussed with son and son in Social worker. Disposition Plan:   snf    Antibiotics:  Levofloxacin December 3>>>    Procedures/Studies: US Scrotum  01/25/2012  *RADIOLOGY REPORT*  Scrotal ultrasound; scrotal Doppler ultrasound  History:  Left-sided scrotal pain  Findings:  Real-time and Doppler interrogation of the scrotal contents was performed.  Both testes are normal in size and contour.  There is no intratesticular mass.  Color flow is seen in both testes.  Both testes demonstrate low resistance wave forms.  The peak systolic velocity in the right testis is  4.2 cm/sec with an end- diastolic velocity of 1.3 cm/sec.  The peak systolic velocity in the left testis is 4.8 cm/sec an end diastolic velocity of 1.4 cm/sec.  Venous outflow is demonstrated bilaterally.  The left epididymis appears somewhat edematous and hyperemic.  The right epididymis appears normal. There are no extratesticular masses on either side.  There is a small hydrocele on the left.  There is no appreciable hydrocele on the right.  There is no scrotal abscess or wall thickening.  There are small varicoceles bilaterally.  Conclusion:  Evidence of left epididymitis.  Small varicoceles. Small left hydrocele.  No intratesticular mass or torsion.   Original Report Authenticated By: Bretta Bang, M.D.    Ct Abdomen Pelvis W Contrast  01/25/2012  *RADIOLOGY REPORT*  Clinical Data: Abdominal pain, hematuria, recent AAA repair  CT ABDOMEN AND PELVIS WITH CONTRAST  Technique:  Multidetector CT imaging of the abdomen and pelvis was performed following the standard  protocol during bolus administration of intravenous contrast.  Contrast: 80mL OMNIPAQUE IOHEXOL 300 MG/ML  SOLN the  Comparison: 11/06/2011  Findings: Cardiomegaly again noted.  Cardiac pacemaker leads are  partially visualized.  Bilateral trace pleural effusion with bilateral basilar posterior atelectasis.  Trace perisplenic ascites.  No calcified gallstones are noted within gallbladder.  Enhanced liver is unremarkable.  Partially atrophic pancreas again noted. The adrenal glands are unremarkable.  Kidneys are symmetrical in size and enhancement.  No hydronephrosis or hydroureter.  Extensive atherosclerotic calcifications SMA origin, celiac trunk origin, splenic artery abdominal aorta and the iliac arteries again noted. Stable postsurgical changes distal abdominal aorta.  No small bowel obstruction.  No ascites or free air.  No adenopathy.  Stable postsurgical changes bilateral common iliac artery.  Left colon diverticula are noted without evidence of acute diverticulitis.  Multiple sigmoid colon diverticula are noted without evidence of acute diverticulitis.  Again noted right inguinal hernia containing fat and short segment of small bowel without evidence for acute complication.  There is a Foley catheter within urinary bladder.  The urinary bladder is decompressed. Small amount of air in anterior aspect urinary bladder is probable post instrumentation.  Mild enlarged prostate gland.  Sagittal images of the spine shows stable osteopenia and mild degenerative changes.  Small left inguinal hernia containing fat without evidence of acute complication.  No destructive bony lesions are noted within pelvis. No pericecal inflammation.  Normal appendix is clearly visualized.  Delayed renal images shows bilateral renal symmetrical excretion.   IMPRESSION: 1.  Stable postsurgical changes distal abdominal aorta and bilateral common iliac artery. 2.  No small bowel obstruction. 3.  Bilateral small pleural effusion with bilateral basilar posterior atelectasis. 4.  Again noted extensive atherosclerotic vascular calcifications. 5.  Normal appendix.  No pericecal inflammation. 6.  Left colon and sigmoid colon diverticula are noted  without evidence of acute diverticulitis. 7.  There is a Foley catheter within decompressed urinary bladder. Mild enlarged prostate gland. 8.  Again noted small right inguinal hernia containing fat and small bowel without evidence of acute complication.   Original Report Authenticated By: Natasha Mead, M.D.    [place] Art/ven Flow Abd Pelv Doppler  01/25/2012  *RADIOLOGY REPORT*  Scrotal ultrasound and scrotal Doppler ultrasound reports are combined into a single dictation.   Original Report Authenticated By: Bretta Bang, M.D.        Subjective: Patient was short of breath last night, very somnolent at this time.  Objective: Filed Vitals:   01/30/12 0600 01/30/12 2030 01/30/12 2112 01/31/12 0513  BP: 124/53 129/78  122/84  Pulse: 59 65  67  Temp: 98.3 F (36.8 C) 98.1 F (36.7 C)  98.5 F (36.9 C)  TempSrc: Oral Oral  Oral  Resp: 16 16  24   Height:      Weight:      SpO2: 96% 96% 92% 91%   No intake or output data in the 24 hours ending 01/31/12 0802 Weight change:  Exam:   General:  Pt is alert, follows commands appropriately, not in acute distress  HEENT: No icterus, No thrush, [place]/AT  Cardiovascular: RRR, S1/S2, no rubs, no gallops  Respiratory: Bibasilar crackles but no wheezes or rhonchi. Good air movement.  Abdomen: Soft/+BS, non tender, non distended, no guarding  Scrotum: No erythema, positive testicular tenderness on palpation  Extremities: No edema, No lymphangitis, No petechiae, No rashes, no synovitis  Data Reviewed: Basic Metabolic Panel:  Lab 01/30/12 3086 01/29/12 0650 01/28/12 0523 01/27/12 0455 01/26/12 5784  NA 139 137 137 137 137  K 3.6 3.3* 3.7 3.8 3.8  CL 95* 94* 96 97 95*  CO2 35* 31 33* 30 32  GLUCOSE 153* 73 71 104* 153*  BUN 56* 56* 51* 45* 33*  CREATININE 1.80* 1.96* 2.21* 2.07* 1.54*  CALCIUM 9.6 9.4 9.3 9.1 9.3  MG -- -- -- -- --  PHOS -- -- -- -- --   Liver Function Tests:  Lab 01/26/12 0520 01/25/12 0745  AST 24 25  ALT 14 14   ALKPHOS 126* 117  BILITOT 0.5 0.6  PROT 6.6 6.7  ALBUMIN 3.3* 3.5   No results found for this basename: LIPASE:5,AMYLASE:5 in the last 168 hours No results found for this basename: AMMONIA:5 in the last 168 hours CBC:  Lab 01/30/12 0945 01/28/12 0523 01/27/12 0455 01/26/12 0520 01/25/12 0745  WBC 6.7 8.2 7.6 6.7 8.8  NEUTROABS -- -- -- 4.7 6.3  HGB 10.6* 10.2* 9.8* 11.6* 11.7*  HCT 32.9* 31.8* 30.3* 37.8* 35.8*  MCV 95.1 95.5 96.2 98.4 95.5  PLT 167 165 165 181 178    Recent Results (from the past 240 hour(s))  URINE CULTURE     Status: Normal   Collection Time   01/25/12  8:21 AM      Component Value Range Status Comment   Specimen Description URINE, CATHETERIZED   Final    Special Requests NONE   Final    Culture  Setup Time 01/25/2012 14:06   Final    Colony Count NO GROWTH   Final    Culture NO GROWTH   Final    Report Status 01/26/2012 FINAL   Final      Scheduled Meds:    . allopurinol  300 mg Oral q1800  . ALPRAZolam  1 mg Oral QHS  . aspirin  81 mg Oral Daily  . diphenhydrAMINE  50 mg Oral QHS  . feeding supplement  237 mL Oral TID BM  . furosemide  40 mg Oral Daily  . gabapentin  300 mg Oral TID PC  . glipiZIDE  5 mg Oral Daily  . iron polysaccharides  150 mg Oral Daily  . isosorbide-hydrALAZINE  1 tablet Oral TID  . levalbuterol  2 puff Inhalation BID  . levofloxacin  250 mg Oral Q24H  . loratadine  10 mg Oral Daily  . nebivolol  2.5 mg Oral Daily  . oxyCODONE  10 mg Oral TID  . pantoprazole  40 mg Oral Daily  . polyethylene glycol  17 g Oral Daily  . ranolazine  500 mg Oral Daily  . senna-docusate  3 tablet Oral BID  . sertraline  100 mg Oral Daily  . simvastatin  20 mg Oral q1800  . Tamsulosin HCl  0.4 mg Oral Daily  . [DISCONTINUED] cyclobenzaprine  5 mg Oral TID   Continuous Infusions:    Meredeth Ide, MD [facility] Pager [phone]  If 7PM-7AM, please contact night-coverage [email or web address] Password [email or web address] 01/31/2012, 8:02 AM    LOS: 6 days

## 2012-01-31 NOTE — Progress Notes (Signed)
Clinical Social Worker continuing to follow to assist with disposition planning. Clinical Social Worker met with pt at bedside and pt reports that pt daughter visiting facilities. Clinical Social Worker spoke with pt daughter, Donnald Garre who confirmed pt family is visiting facilities, but has not yet made decision about SNF. Clinical Social Worker provided this Engineer, civil (consulting) information and encouraged pt daughter to notify this Visual merchandiser when decision made. Clinical Social Worker to continue to follow and facilitate pt discharge needs when pt medically stable for discharge.  Jacklynn Lewis, MSW, LCSWA  Clinical Social Work [phone]

## 2012-01-31 NOTE — Progress Notes (Signed)
Daughter concerned about rash to patients head and face.  States has gotten more pronounced.  States that she remembered that patient was prescribed flomax and neurotin before and both were d/c'd.  She could not remember the reason but stated that she would follow up and call to inform about this in the AM.  RN called the MD to notyify.  MD d/c'd the medications and ordered benadryl 25mg  po.  Will continue to monitor patient frequently.

## 2012-01-31 NOTE — Evaluation (Signed)
Clinical/Bedside Swallow Evaluation Patient Details  Name: Dustin [name] MRN: [id number] Date of Birth: 05/20/1926  Today's Date: 01/31/2012 Time: 4098-1191 SLP Time Calculation (min): 62 min  Past Medical History:  Past Medical History  Diagnosis Date  . IHD (ischemic heart disease)      -  non-ST elevation MI in July 2009. and NSTEMI Jan 2010 in setting of RLL CAP. 99% diagonal  disease s/p stent July of 2009. Cath Jan 2010 showed  occulusion of SVG to RCA   LVEF 45%. rec med rx   - Echo 10/29/08 EF 25%  . PAD (peripheral artery disease)   . Hyperlipidemia   . Hypertension   . Obstructive sleep apnea      Refuses to wear his CPAP.  Not on oxygen   . Vocal cord dysfunction     with Botox injection at [facility] around 2005  per patient.     - Patient's swallowing difficulties may be related to vocal    cord dysfunction, recommended to be followed up at [facility]  . Stroke     History of left brain cerebrovascular accident in 1998 without   . Dysphagia       -swallowing study nl oropharyngeal swallow, brief pause  at top of the esophagus which clears with liquid wash or small bites.    Rec  regular diet with thin liquids and continue management of    esophageal dysmotility.   - Barium swallow 10/31/08  mild dilation upper esosphagus, moderate slowing, can't r/o mass  . Anemia         03/21/2008 hgb 10.9, MCV 86, Stool occult blood postiive -> On IV Iron   . COPD (chronic obstructive pulmonary disease)     - Hypoxemia, former smoker-  FEV1 1.86 (72%) , ratio 68% (07/26/08) - FEV1 1.61 (62%) , ratio 74    ( 10/29/08) with no insp or exp truncation on f/v loop   Past Surgical History:  Past Surgical History  Procedure Date  . Coronary artery bypass graft   . Abdominal aortic aneurysm repair   . Aortobifemoral bypass graft in the past   . Icd insertion     ICD Med[employer]onic   HPI:  78 yo male adm to [facility] with CHF on 01/25/2012.  PMH + for incluisve body myositis (per  daughter [name]).  Pt resides in Independent Living and was able to wheel himself to dining room for lunch around Thanksgiving day.  Son in Social worker, [place], reports pt with worsening cough during intake over the last few months - causing pt to choke when he eats.  Open mouth posture noted when pt awake, which daughter states has been present over the last few weeks.  Pt has undergone an MBS June 2011- no results located.  Pt CXR 12/5 Stable moderate cardiomegaly.  Question mild pulmonary vascularture congestion.     Assessment / Plan / Recommendation Clinical Impression  Pt presents with clinical indications of a profound oropharyngeal dysphagia and known esophageal deficits with suspected aspiration of all liquids provided.  Per daughter, pt has diagnosis of inclusive body myositis, suspect dysphagia is as a result of this condition, known esophageal issues and CVA, exacerbated by current medical illness.    When thin water was administered, pt with wet "gurgling" sound, ? UES dysfunction????, with suspected regurgitation with overt cough with discomfort - face turning red.  After several strong reflexive coughs and expectoration of viscous secretions-respiratory status recovered.  If MD  indicates, SLP questions if GI may offer help for this pt's suspected multifactorial dysphagia??   See notes above from previous vocal fold, esophageal and oropharyngeal evaluations/tx.  In this SLPs opinion, pt will likely aspirate regardless of consistency and dysphagia may prevent adequate po which may impair his functional rehabilitation.    Note pt is being followed by hospice.  Daughters Kathie Rhodes and [employer] spoken to via phone before evaluation completed and stated they desired for pt to continue soft diet with tips in place to decr amount aspirated- not elminiate it.  Uncertain if family and pt comprehend profound level of dysphagia.  Pt is only able to consume a few bites before he starting overtly coughing- which will  impair his ability to consume adequate po and protect airway.    Do not recommend MBS, unless family and MD desire , as do no anticipate would change pt's outcomes.  SLP to follow up for family education x1.  Thanks for the consult.      Aspiration Risk  Severe    Diet Recommendation Dysphagia 3 (Mechanical Soft);Nectar-thick liquid (with accepted aspiration risks!  COMFORT ONLY!)   Liquid Administration via: Straw;Cup Medication Administration: Crushed with puree Supervision: Staff feed patient;Full supervision/cueing for compensatory strategies Compensations: Slow rate;Small sips/bites;Multiple dry swallows after each bite/sip (frequent breaks when pt cough; small meals) Postural Changes and/or Swallow Maneuvers: Seated upright 90 degrees;Upright 30-60 min after meal    Other  Recommendations Recommended Consults: Consider GI evaluation Oral Care Recommendations: Oral care before and after PO Other Recommendations: Order thickener from pharmacy;Clarify dietary restrictions;Have oral suction available   Follow Up Recommendations       Frequency and Duration min 1 x/week  1 week   Pertinent Vitals/Pain Afebrile, intake 50-80%    SLP Swallow Goals Patient will utilize recommended strategies during swallow to increase swallowing safety with: Total assistance   Swallow Study Prior Functional Status   pt fed self prior to admission per [name]-now with profound deficits    General Date of Onset: 01/31/12 HPI: 78 yo male adm to [facility] with CHF on 01/25/2012.  PMH + for incluisve body myositis (per daughter [name]).  Pt resides in Independent Living and was able to wheel himself to dining room for lunch around Thanksgiving day.  Son in Social worker, Painter, reports pt with worsening cough during intake over the last few months - causing pt to choke when he eats.  Open mouth posture noted when pt awake, which daughter states has been present over the last few weeks.  Pt has undergone an MBS June 2011- no  results located.  Pt CXR 12/5 Stable moderate cardiomegaly.  Question mild pulmonary vascularture congestion.   Type of Study: Bedside swallow evaluation Previous Swallow Assessment: MBS 07/2009- no results Diet Prior to this Study: Dysphagia 3 (soft);Nectar-thick liquids Temperature Spikes Noted: No Respiratory Status: Supplemental O2 delivered via (comment) History of Recent Intubation: No Behavior/Cognition: Lethargic;Distractible;Decreased sustained attention;Confused Oral Cavity - Dentition: Dentures, top (lower-front present) Self-Feeding Abilities: Total assist Patient Positioning: Upright in bed Baseline Vocal Quality: Low vocal intensity;Clear Volitional Cough: Strong;Weak (strong reflexive cough, weak volitional) Volitional Swallow: Able to elicit    Oral/Motor/Sensory Function Overall Oral Motor/Sensory Function: Other (comment) (profound weakness:  ) Lingual ROM: Reduced left (deviate to left slight with protrusion)   Ice Chips Ice chips: Not tested   Thin Liquid Thin Liquid: Impaired Presentation: Cup;Straw;Spoon Oral Phase Impairments: Impaired anterior to posterior transit;Reduced lingual movement/coordination Oral Phase Functional Implications: Prolonged oral transit Pharyngeal  Phase  Impairments: Throat Clearing - Delayed;Suspected delayed Swallow;Multiple swallows;Cough - Delayed;Wet Vocal Quality Other Comments: wet "gurgling" sound, ? UES dysfunction????,  overt cough with discomfort - face turning red- after several strong reflexive coughs and expectoration of viscous secretions-respiratory status recovered    Nectar Thick Nectar Thick Liquid: Impaired Presentation: Straw;Cup;Spoon Oral Phase Impairments: Reduced lingual movement/coordination;Impaired anterior to posterior transit Pharyngeal Phase Impairments: Suspected delayed Swallow;Multiple swallows;Throat Clearing - Delayed;Cough - Delayed   Honey Thick Honey Thick Liquid: Not tested   Puree Puree:  Impaired Presentation: Spoon Oral Phase Impairments: Impaired anterior to posterior transit;Reduced lingual movement/coordination Oral Phase Functional Implications: Prolonged oral transit Pharyngeal Phase Impairments: Suspected delayed Swallow;Multiple swallows Other Comments: several swallows observed ? pharyngeal stasis - no overt clinical indicators of aspiration observed   Solid   GO    Solid: Impaired Oral Phase Impairments: Reduced lingual movement/coordination;Impaired anterior to posterior transit Oral Phase Functional Implications: Oral residue;Oral holding (pt needed cues to swallow) Pharyngeal Phase Impairments: Suspected delayed Swallow;Multiple swallows       Donavan Burnet, MS [facility] SLP [phone]

## 2012-01-31 NOTE — Progress Notes (Signed)
Physical Therapy Treatment Patient Details Name: Dustin [name] MRN: [id number] DOB: [birth year] Today's Date: 01/31/2012 Time: 3244-0102 PT Time Calculation (min): 28 min  PT Assessment / Plan / Recommendation Comments on Treatment Session  Supine to sit wit +2 total assist, pt sat on EOB x 5 minutes with mod A due to posterior lean. Transfer not attempted due to poor sitting balance. Performed bed mobility with assist. Pt grossly quite weak. Recommend mechanical lift for transfers.     Follow Up Recommendations  SNF     Does the patient have the potential to tolerate intense rehabilitation     Barriers to Discharge        Equipment Recommendations  None recommended by PT    Recommendations for Other Services OT consult  Frequency Min 3X/week   Plan Discharge plan remains appropriate    Precautions / Restrictions Precautions Precautions: Fall Restrictions Weight Bearing Restrictions: No   Pertinent Vitals/Pain *verbalized pain R foot, RN aware**    Mobility  Bed Mobility Bed Mobility: Supine to Sit;Sit to Supine;Rolling Left;Rolling Right Rolling Right: 1: +2 Total assist Rolling Right: Patient Percentage: 40% Rolling Left: 3: Mod assist Supine to Sit: 1: +2 Total assist Supine to Sit: Patient Percentage: 30% Sit to Supine: 1: +2 Total assist Sit to Supine: Patient Percentage: 30% Details for Bed Mobility Assistance: Increased time. Assist for trunk to upright and bil LEs off bed. Multimodal cues for safety, technique, hand placement  Transfers Transfers: Not assessed Details for Transfer Assistance: pt with heavy posterior lean in sitting at EOB, unsafe to attempt transfer Ambulation/Gait Ambulation/Gait Assistance: Not tested (comment)    Exercises     PT Diagnosis:    PT Problem List:   PT Treatment Interventions:     PT Goals Acute Rehab PT Goals Pt will go Supine/Side to Sit: with min assist PT Goal: Supine/Side to Sit - Progress: Not progressing Pt will  go Sit to Supine/Side: with min assist PT Goal: Sit to Supine/Side - Progress: Not progressing Pt will go Sit to Stand: with min assist PT Goal: Sit to Stand - Progress: Not progressing Pt will go Stand to Sit: with min assist Pt will Transfer Bed to Chair/Chair to Bed: with min assist  Visit Information  Last PT Received On: 01/31/12 Assistance Needed: +2    Subjective Data  Subjective: Where's my jacket? Patient Stated Goal: none stated   Cognition  Overall Cognitive Status: Impaired Area of Impairment: Memory;Following commands;Safety/judgement;Awareness of errors;Awareness of deficits Arousal/Alertness: Awake/alert Orientation Level: Disoriented to;Place;Time;Situation Behavior During Session: Anxious Current Attention Level: Sustained Following Commands: Follows one step commands with increased time Safety/Judgement: Decreased safety judgement for tasks assessed Awareness of Errors: Assistance required to correct errors made;Assistance required to identify errors made    Balance  Balance Balance Assessed: Yes Static Sitting Balance Static Sitting - Balance Support: Bilateral upper extremity supported;Feet supported Static Sitting - Level of Assistance: 3: Mod assist Static Sitting - Comment/# of Minutes: 5 minutes, mod assist to maintain neutral 2* posterior lean  End of Session PT - End of Session Activity Tolerance: Patient limited by fatigue Patient left: in bed;with call bell/phone within reach;with family/visitor present Nurse Communication: Mobility status   GP     Ralene Bathe Kistler 01/31/2012, 2:08 PM [phone]

## 2012-02-01 MED ORDER — ALPRAZOLAM 1 MG PO TABS
1.0000 mg | ORAL_TABLET | Freq: Three times a day (TID) | ORAL | Status: DC | PRN
  Administered 2012-02-01: 1 mg via ORAL
  Filled 2012-02-01: qty 1

## 2012-02-01 NOTE — Progress Notes (Signed)
Pt lying in bed with head elevated.  Daughter at side.  Pt folding and pulling at covers.  Daughter reported having gone to two SNF's yesterday.  They want to go to others but the daughter from [place] had to go back and she does not think anyone can even be here tomorrow.  They are undecided on facilities.  Encouraged to go back to visit unannounced  if they are considering one over the other.  Pt continues to have periods of increased confusion.  Family concerned that he maybe has had a stroke or a UTI.  Caregiver reported that the pt has complained of pain in his private area, abdomen and legs a couple of times today.  Assigned nurse has given some morphine.   Reviewed chart.  Discharge is pending families decision on SNF.  This is a hospice related admission.  Please contact [facility] with any pt movements.  Pam PetersonRN, [facility]

## 2012-02-01 NOTE — Progress Notes (Signed)
[facility]: Sw note:   Met with Dustin [name] in the hallway, she stated that family is visiting snf's in the area and are working with hospital Sw on placement for Dustin [name]. Dustin [name] stated that Dustin [name] continues to be confused at times, repeating questions in conversation and not even making sense during conversation at times. Dustin [name] stated that family remains hopeful that Dustin [name] can go to snf rehab and then return home. This Sw provided active/reflective listening and emotional support during visit. Lorain Childes, LCSW, ACHP-SW

## 2012-02-01 NOTE — Progress Notes (Signed)
Speech Language Pathology Dysphagia Treatment Patient Details Name: Dustin [name] MRN: [id number] DOB: 03-15-1926 Today's Date: 02/01/2012 Time: 4098-1191 SLP Time Calculation (min): 44 min  Assessment / Plan / Recommendation Clinical Impression  Pt seen to assess tolerance of po diet and to educate family/pt.  Much improved LOA and swallow function noted today, pt actually helped to hold his cup to self-feed liquid.  Observed pt consuming medications with RN (crushed with pureed), nectar thick juice, ice, and thin water via cup.  Delayed swallow continues but no clinical indicators of aspiration noted today!  Pt's speech was 90% intelligible throughout 75% of session- until he became sleepy.  He was given pain medications at beginning of therapy, therefore suspect medications accounted for this later lethargy.    Educated wife and daughter to tips to maximize pt's airway protection with is multifactorial dysphagia and clinical reasoning for current diet.  Chronicity of pt's dysphagia and importance to only feed pt when fully alert reviewed as well.  Spouse acknowledges pt was coughing while eating prior to admission with recent progression.    As pt with much improvement today, recommend to allow thin water and ice chips between meals after oral care for comfort- as pt expressed desire for water.  Continue nectar thick liquid with meal to improve pt comfort with po.  Pt and spouse agreeable to plan.    Per spouse, pt may d'c tomorrow, recommend short term SLP follow up at [facility] for dysphagia management.      Diet Recommendation  Initiate / Change Diet: Dysphagia 3 (mechanical soft);Nectar-thick liquid (thin water and ice between meals after oral care)    SLP Plan Continue with current plan of care   Pertinent Vitals/Pain Afebrile, decreased   Swallowing Goals  SLP Swallowing Goals Swallow Study Goal #2 - Progress: Progressing toward goal  General Temperature Spikes Noted: No Respiratory  Status: Supplemental O2 delivered via (comment) Behavior/Cognition: Alert;Cooperative (pt lethargic at end of session,received pain med at beginnin) Oral Cavity - Dentition: Dentures, top (lower present) Patient Positioning: Upright in bed  Oral Cavity - Oral Hygiene     Dysphagia Treatment Treatment focused on: Skilled observation of diet tolerance Patient observed directly with PO's: Yes Type of PO's observed: Dysphagia 1 (puree);Thin liquids;Nectar-thick liquids Feeding: Total assist Liquids provided via: Teaspoon;Cup Pharyngeal Phase Signs & Symptoms: Suspected delayed swallow initiation Type of cueing: Tactile;Verbal Amount of cueing: Moderate (to take small bites/sips and to swallow)   GO     Donavan Burnet, MS [facility] SLP [phone]

## 2012-02-01 NOTE — Progress Notes (Signed)
Clinical Social Worker and RNCM met with pt daughter, Delice Bison in serenity room to discuss disposition planning. Pt daughter stated that family was undecided about SNF and were hopeful for another day or two to explore facilities. Clinical Social Worker discussed with pt daughter that pt insurance contacted this Clinical Social Worker and stated that pt would need to be discharge tomorrow, Wednesday 12/11 as pt will no longer meet medical criteria to remain in the hospital. Pt daughter became emotional and Clinical Social Worker provided support. Clinical Social Worker provided list of bed offers again as pt daughter reports that pt sister has list in [place]. Pt daughter plans to discuss with sister and make a decision. Pt daughter did ask Clinical Social Worker to contact [facility] in regard to bed availability. Clinical Social Worker contacted [facility] and left message for admission coordinator. Clinical Social Worker provided pt daughter this Clinical Social Worker contact information and Clinical Social Worker will follow up with pt daughter in regard to decision in order for pt to be discharged to [facility] tomorrow 12/11.   Jacklynn Lewis, MSW, LCSWA  Clinical Social Work [phone]

## 2012-02-01 NOTE — Progress Notes (Signed)
[facility] PROGRESS NOTE  Dustin [name] Plan UJW:[id number] DOB: 03-Oct-1926 DOA: 01/25/2012 PCP: Kimber Relic, MD   Brief history: 78 year old gentleman who began having confusion on Saturday night into Sunday morning. The patient currently receives home hospice at his ALF.  Hospice noted increased 7 pounds gain since July and patient was given extra doses of furosemide on Saturday and on Sunday. The patient also had bibasilar crackles. The patient had difficulty getting to the bathroom in time. Because the patient had difficult time  to urinate, a Foley catheter was placed on Monday by the hospice nurse. Since it was placed, there was notation of hematuria, likely from trauma. Unfortunately, the patient continued to worsen with his agitation on Monday. As a result, the hospice nurse went to visit him on Monday night and recommended that the patient go to the emergency department. There was no history of urinary retention. In emergency department, patient complaint of genital and inguinal pain which prompted scrotal ultrasound and diagnosis of epididymitis.  Patient was started on Levaquin but continued to have testicular pain so a urology consult was obtained. At this time patient has been started on date protime q. 8 hours when necessary by urology. Patient also has CHF his Lasix initially was held due to worsening renal insufficiency. But patient developed wheezing so we started him back on Lasix. At this time patient is stable the pleasantly confused due to underlying dementia. Swallow elevation has been done and patient has been started on modified dysphagia 3 diet with thickened liquids.  Assessment/Plan:  Acute epididymitis -likely caused by Enterobacteriaceae, coliforms -On exam, appears to be mild -Continue levofloxacin day 7/14  - S him him him him crotal support. - Urology following  Acute encephalopathy fluctuating Most likely due to UTI Continue with levaquin  CKD stage  III Cr is stable  Dysphagia Patient started on modified dysphagia 3 diet after the swallow evaluation  Dementia Currently stable  Ischemic cardiomyopathy -Ejection fraction 25-30% s/p AICD Will start back on lasix 40 mg po daily Continue BiDil and bystolic  Diabetes mellitus type 2 -Will discontinue glipizide at this time given worsening renal failure and increasin alert and he and he him without g of the medication -Continue NovoLog sliding scale for now  Pelvic pain Mostly due to bladder spasms Also has degenerative disc disease We'll start him on Neurontin 300 by mouth 3 times a day  Will d/c flexeril for excessive somnolence  Rash Most likely due to Neurontin and Flomax which have been discontinued  Family Communication:   Daughter at beside, discussed with son and son in Social worker. Disposition Plan:   snf once that becomes available    Antibiotics:  Levofloxacin December 3>>>  Consults Urology  Procedures/Studies: US Scrotum  01/25/2012  *RADIOLOGY REPORT*  Scrotal ultrasound; scrotal Doppler ultrasound  History:  Left-sided scrotal pain  Findings:  Real-time and Doppler interrogation of the scrotal contents was performed.  Both testes are normal in size and contour.  There is no intratesticular mass.  Color flow is seen in both testes.  Both testes demonstrate low resistance wave forms.  The peak systolic velocity in the right testis is  4.2 cm/sec with an end- diastolic velocity of 1.3 cm/sec.  The peak systolic velocity in the left testis is 4.8 cm/sec an end diastolic velocity of 1.4 cm/sec.  Venous outflow is demonstrated bilaterally.  The left epididymis appears somewhat edematous and hyperemic.  The right epididymis appears normal. There are no extratesticular masses on either side.  There is a small hydrocele on the left.  There is no appreciable hydrocele on the right.  There is no scrotal abscess or wall thickening.  There are small varicoceles bilaterally.   Conclusion:  Evidence of left epididymitis.  Small varicoceles. Small left hydrocele.  No intratesticular mass or torsion.   Original Report Authenticated By: Bretta Bang, M.D.    Ct Abdomen Pelvis W Contrast  01/25/2012  *RADIOLOGY REPORT*  Clinical Data: Abdominal pain, hematuria, recent AAA repair  CT ABDOMEN AND PELVIS WITH CONTRAST  Technique:  Multidetector CT imaging of the abdomen and pelvis was performed following the standard protocol during bolus administration of intravenous contrast.  Contrast: 80mL OMNIPAQUE IOHEXOL 300 MG/ML  SOLN the  Comparison: 11/06/2011  Findings: Cardiomegaly again noted.  Cardiac pacemaker leads are partially visualized.  Bilateral trace pleural effusion with bilateral basilar posterior atelectasis.  Trace perisplenic ascites.  No calcified gallstones are noted within gallbladder.  Enhanced liver is unremarkable.  Partially atrophic pancreas again noted. The adrenal glands are unremarkable.  Kidneys are symmetrical in size and enhancement.  No hydronephrosis or hydroureter.  Extensive atherosclerotic calcifications SMA origin, celiac trunk origin, splenic artery abdominal aorta and the iliac arteries again noted. Stable postsurgical changes distal abdominal aorta.  No small bowel obstruction.  No ascites or free air.  No adenopathy.  Stable postsurgical changes bilateral common iliac artery.  Left colon diverticula are noted without evidence of acute diverticulitis.  Multiple sigmoid colon diverticula are noted without evidence of acute diverticulitis.  Again noted right inguinal hernia containing fat and short segment of small bowel without evidence for acute complication.  There is a Foley catheter within urinary bladder.  The urinary bladder is decompressed. Small amount of air in anterior aspect urinary bladder is probable post instrumentation.  Mild enlarged prostate gland.  Sagittal images of the spine shows stable osteopenia and mild degenerative changes.  Small  left inguinal hernia containing fat without evidence of acute complication.  No destructive bony lesions are noted within pelvis. No pericecal inflammation.  Normal appendix is clearly visualized.  Delayed renal images shows bilateral renal symmetrical excretion.   IMPRESSION: 1.  Stable postsurgical changes distal abdominal aorta and bilateral common iliac artery. 2.  No small bowel obstruction. 3.  Bilateral small pleural effusion with bilateral basilar posterior atelectasis. 4.  Again noted extensive atherosclerotic vascular calcifications. 5.  Normal appendix.  No pericecal inflammation. 6.  Left colon and sigmoid colon diverticula are noted without evidence of acute diverticulitis. 7.  There is a Foley catheter within decompressed urinary bladder. Mild enlarged prostate gland. 8.  Again noted small right inguinal hernia containing fat and small bowel without evidence of acute complication.   Original Report Authenticated By: Natasha Mead, M.D.    [place] Art/ven Flow Abd Pelv Doppler  01/25/2012  *RADIOLOGY REPORT*  Scrotal ultrasound and scrotal Doppler ultrasound reports are combined into a single dictation.   Original Report Authenticated By: Bretta Bang, M.D.        Subjective: Patient developed a rash on the face and scalp, which has not improved after stopping the Neurontin and Flomax.  Objective: Filed Vitals:   01/31/12 0513 01/31/12 1401 01/31/12 2200 02/01/12 0558  BP: 122/84  110/50 130/54  Pulse: 67   96  Temp: 98.5 F (36.9 C)   98.3 F (36.8 C)  TempSrc: Oral   Oral  Resp: 24   20  Height:      Weight:      SpO2: 91% 84%  93%    Intake/Output Summary (Last 24 hours) at 02/01/12 0800 Last data filed at 01/31/12 1900  Gross per 24 hour  Intake     30 ml  Output    425 ml  Net   -395 ml   Weight change:  Exam:   General:  Pt is alert, follows commands appropriately, not in acute distress  HEENT: Faint maculopapular rash noted on the cheeks and scalp, no  itching  Cardiovascular: RRR, S1/S2, no rubs, no gallops  Respiratory: Bibasilar crackles but no wheezes or rhonchi. Good air movement.  Abdomen: Soft/+BS, non tender, non distended, no guarding  Scrotum: No erythema, positive testicular tenderness on palpation  Extremities: No edema, No lymphangitis, No petechiae, No rashes, no synovitis  Data Reviewed: Basic Metabolic Panel:  Lab 01/30/12 1610 01/29/12 0650 01/28/12 0523 01/27/12 0455 01/26/12 0520  NA 139 137 137 137 137  K 3.6 3.3* 3.7 3.8 3.8  CL 95* 94* 96 97 95*  CO2 35* 31 33* 30 32  GLUCOSE 153* 73 71 104* 153*  BUN 56* 56* 51* 45* 33*  CREATININE 1.80* 1.96* 2.21* 2.07* 1.54*  CALCIUM 9.6 9.4 9.3 9.1 9.3  MG -- -- -- -- --  PHOS -- -- -- -- --   Liver Function Tests:  Lab 01/26/12 0520  AST 24  ALT 14  ALKPHOS 126*  BILITOT 0.5  PROT 6.6  ALBUMIN 3.3*   No results found for this basename: LIPASE:5,AMYLASE:5 in the last 168 hours No results found for this basename: AMMONIA:5 in the last 168 hours CBC:  Lab 01/30/12 0945 01/28/12 0523 01/27/12 0455 01/26/12 0520  WBC 6.7 8.2 7.6 6.7  NEUTROABS -- -- -- 4.7  HGB 10.6* 10.2* 9.8* 11.6*  HCT 32.9* 31.8* 30.3* 37.8*  MCV 95.1 95.5 96.2 98.4  PLT 167 165 165 181    Recent Results (from the past 240 hour(s))  URINE CULTURE     Status: Normal   Collection Time   01/25/12  8:21 AM      Component Value Range Status Comment   Specimen Description URINE, CATHETERIZED   Final    Special Requests NONE   Final    Culture  Setup Time 01/25/2012 14:06   Final    Colony Count NO GROWTH   Final    Culture NO GROWTH   Final    Report Status 01/26/2012 FINAL   Final      Scheduled Meds:    . allopurinol  300 mg Oral q1800  . ALPRAZolam  1 mg Oral QHS  . aspirin  81 mg Oral Daily  . diphenhydrAMINE  50 mg Oral QHS  . feeding supplement  237 mL Oral TID BM  . furosemide  40 mg Oral Daily  . glipiZIDE  5 mg Oral Daily  . iron polysaccharides  150 mg Oral Daily   . isosorbide-hydrALAZINE  1 tablet Oral TID  . levalbuterol  2 puff Inhalation BID  . levofloxacin  250 mg Oral Q24H  . loratadine  10 mg Oral Daily  . nebivolol  2.5 mg Oral Daily  . oxyCODONE  10 mg Oral TID  . pantoprazole  40 mg Oral Daily  . polyethylene glycol  17 g Oral Daily  . ranolazine  500 mg Oral Daily  . senna-docusate  3 tablet Oral BID  . sertraline  100 mg Oral Daily  . simvastatin  20 mg Oral q1800  . [DISCONTINUED] cyclobenzaprine  5 mg Oral TID  . [DISCONTINUED]  gabapentin  300 mg Oral TID PC  . [DISCONTINUED] Tamsulosin HCl  0.4 mg Oral Daily   Continuous Infusions:    Meredeth Ide, MD [facility] Pager [phone]  If 7PM-7AM, please contact night-coverage [email or web address] Password [email or web address] 02/01/2012, 8:00 AM   LOS: 7 days

## 2012-02-02 DIAGNOSIS — J45909 Unspecified asthma, uncomplicated: Secondary | ICD-10-CM

## 2012-02-02 MED ORDER — ALPRAZOLAM 1 MG PO TABS: 1.0000 mg | ORAL_TABLET | Freq: Every day | ORAL | Status: DC

## 2012-02-02 MED ORDER — LEVOFLOXACIN 250 MG PO TABS: 250.0000 mg | ORAL_TABLET | ORAL | Status: DC

## 2012-02-02 MED ORDER — ALPRAZOLAM 1 MG PO TABS: 1.0000 mg | ORAL_TABLET | Freq: Three times a day (TID) | ORAL | Status: DC | PRN

## 2012-02-02 MED ORDER — OXYBUTYNIN CHLORIDE 5 MG PO TABS: 5.0000 mg | ORAL_TABLET | Freq: Three times a day (TID) | ORAL | Status: AC | PRN

## 2012-02-02 MED ORDER — MORPHINE SULFATE (CONCENTRATE) 20 MG/ML PO SOLN: 5.0000 mg | ORAL | Status: DC | PRN

## 2012-02-02 MED ORDER — FUROSEMIDE 40 MG PO TABS: 40.0000 mg | ORAL_TABLET | Freq: Every day | ORAL | Status: DC

## 2012-02-02 MED ORDER — OXYCODONE HCL 10 MG PO TABS: 10.0000 mg | ORAL_TABLET | Freq: Three times a day (TID) | ORAL | Status: DC

## 2012-02-02 NOTE — Progress Notes (Signed)
[facility]AR arrived @ 1730.

## 2012-02-02 NOTE — Plan of Care (Signed)
Problem: Discharge Progression Outcomes Goal: Discharge plan in place and appropriate Outcome: Completed/Met Date Met:  02/02/12 DC to [facility].

## 2012-02-02 NOTE — Progress Notes (Signed)
Clinical Social Worker facilitated pt discharge needs to [facility] including contacting facility, faxing pt discharge information via [facility], discussing with pt daughters, providing RN contact phone number to call report, confirmed with [facility] SW, Lorain Childes that revocation of hospice benefits had been completed, and arranging ambulance transportation for pt to [facility]. No further social work needs identified at this time. Clinical Social Worker signing off.   Jacklynn Lewis, MSW, LCSWA  Clinical Social Work [phone]

## 2012-02-02 NOTE — Discharge Summary (Signed)
Physician Discharge Summary  Dustin [name] RUE:[id number] DOB: 1926-03-01 DOA: 01/25/2012  PCP: Kimber Relic, MD  Admit date: 01/25/2012 Discharge date: 02/02/2012  Recommendations for Outpatient Follow-up:  1. Pt will need to follow up with PCP in 2-3 weeks post discharge 2. Please obtain BMP to evaluate electrolytes and kidney function 3. Please also check CBC to evaluate Hg and Hct levels 4. Please note that pt was discharged on Levaquin to complete the therapy for 7 more days, this will make it 14 days therapy 5. Please note that the dose of Lasix was decreased from 80 mg PO QD to 40 mg PO QD due to acute renal failure 6. Dose of lasix can be readjusted as needed  Discharge Diagnoses: Acute epididymitis   Active Problems:  CARDIOMYOPATHY, ISCHEMIC  Atrial fibrillation  Epididymitis, left  Generalized weakness  CKD (chronic kidney disease) stage 3, GFR 30-59 ml/min  Acute encephalopathy  Discharge Condition: Stable  Diet recommendation: Modified dysphagia 3 diet with thickened liquids.   Brief history:  78 year old gentleman who began having confusion on Saturday night into Sunday morning. The patient currently receives home hospice at his ALF. Hospice noted increased 7 pounds gain since July and patient was given extra doses of furosemide on Saturday and on Sunday. The patient also had bibasilar crackles. The patient had difficulty getting to the bathroom in time. Because the patient had difficult time to urinate, a Foley catheter was placed on Monday by the hospice nurse. Since it was placed, there was notation of hematuria, likely from trauma. Unfortunately, the patient continued to worsen with his agitation on Monday. As a result, the hospice nurse went to visit him on Monday night and recommended that the patient go to the emergency department. There was no history of urinary retention. In emergency department, patient complaint of genital and inguinal pain which prompted  scrotal ultrasound and diagnosis of epididymitis.  Patient was started on Levaquin but continued to have testicular pain so a urology consult was obtained. Patient also has CHF his Lasix initially was held due to worsening renal insufficiency. But patient developed wheezing so we started him back on Lasix. At this time patient is stable the pleasantly confused due to underlying dementia. Swallow elevation has been done and patient has been started on modified dysphagia 3 diet with thickened liquids.   Assessment/Plan:  Acute epididymitis   Likely caused by Enterobacteriaceae, coliforms   On exam, appears to be mild   Continue levofloxacin day 8/14   Urology following during the hospital stay Acute encephalopathy   fluctuating   Most likely due to UTI   Continue with levaquin day 8/14 CKD stage III   Cr is stable and even trending down  Dysphagia   Patient started on modified dysphagia 3 diet after the swallow evaluation  Dementia   Currently stable  Ischemic cardiomyopathy   Ejection fraction 25-30% s/p AICD   Continue lasix 40 mg po daily   Continue BiDil and bystolic  Diabetes mellitus type 2   Will continue Glipizide upon discharge  Pelvic pain   Mostly due to bladder spasms   Also has degenerative disc disease  Rash   Most likely due to Neurontin and Flomax which have been discontinued   Antibiotics:   Levofloxacin December 3>>> 02/09/2012 Consults   Urology   Procedures/Studies:  US Scrotum 01/25/2012  Evidence of left epididymitis. Small varicoceles. Small left hydrocele. No intratesticular mass or torsion.   Ct Abdomen Pelvis W Contrast  01/25/2012 1. Stable  postsurgical changes distal abdominal aorta and bilateral common iliac artery.  2. No small bowel obstruction.  3. Bilateral small pleural effusion with bilateral basilar posterior atelectasis.  4. Again noted extensive atherosclerotic vascular calcifications.  5. Normal appendix. No  pericecal inflammation.  6. Left colon and sigmoid colon diverticula are noted without evidence of acute diverticulitis.  7. There is a Foley catheter within decompressed urinary bladder. Mild enlarged prostate gland.  8. Again noted small right inguinal hernia containing fat and small bowel without evidence of acute complication.   Discharge Exam: Filed Vitals:   02/02/12 0518  BP: 99/49  Pulse: 81  Temp: 98.6 F (37 C)  Resp: 16   Filed Vitals:   01/31/12 2200 02/01/12 0558 02/01/12 0932 02/02/12 0518  BP: 110/50 130/54  99/49  Pulse:  96 72 81  Temp:  98.3 F (36.8 C)  98.6 F (37 C)  TempSrc:  Oral    Resp:  20 18 16   Height:      Weight:      SpO2:  93% 92% 91%    General: Pt is alert, follows some commands appropriately, not in acute distress Cardiovascular: Regular rate and rhythm, S1/S2 +, no murmurs, no rubs, no gallops Respiratory: Clear to auscultation bilaterally, decreased breath sounds at bases Abdominal: Soft, non tender, non distended, bowel sounds +, no guarding Extremities: no edema, no cyanosis, pulses palpable bilaterally DP and PT Neuro: Grossly nonfocal  Discharge Instructions  Discharge Orders    Future Appointments: Provider: Department: Dept Phone: Center:   04/03/2012 1:05 PM [facility] Main Office [place]) [id number]     Future Orders Please Complete By Expires   Diet - low sodium heart healthy      Increase activity slowly          Medication List     As of 02/02/2012 10:20 AM    STOP taking these medications         metolazone 5 MG tablet   Commonly known as: ZAROXOLYN      oxycodone 5 MG capsule   Commonly known as: OXY-IR      TAKE these medications         allopurinol 300 MG tablet   Commonly known as: ZYLOPRIM   Take 300 mg by mouth every evening.      ALPRAZolam 1 MG tablet   Commonly known as: XANAX   Take 1 tablet (1 mg total) by mouth at bedtime. And 3 times daily  as needed for anxiety      aspirin 81 MG tablet   Take 81 mg by mouth every morning.      benzonatate 200 MG capsule   Commonly known as: TESSALON   Take 200 mg by mouth 3 (three) times daily as needed. For cough      cetirizine 10 MG chewable tablet   Commonly known as: ZYRTEC   Chew 10 mg by mouth every morning.      cholecalciferol 1000 UNITS tablet   Commonly known as: VITAMIN D   Take 2,000 Units by mouth every evening.      diphenhydrAMINE 25 MG tablet   Commonly known as: BENADRYL   Take 50 mg by mouth at bedtime.      furosemide 40 MG tablet   Commonly known as: LASIX   Take 1 tablet (40 mg total) by mouth daily.      glipiZIDE 5 MG tablet   Commonly known as: GLUCOTROL  Take 5 mg by mouth every morning.      guaiFENesin 600 MG 12 hr tablet   Commonly known as: MUCINEX   Take 600-1,200 mg by mouth every 12 (twelve) hours as needed. For cough      iron polysaccharides 150 MG capsule   Commonly known as: NIFEREX   Take 150 mg by mouth every morning.      isosorbide-hydrALAZINE 20-37.5 MG per tablet   Commonly known as: BIDIL   Take 1 tablet by mouth 3 (three) times daily.      levofloxacin 250 MG tablet   Commonly known as: LEVAQUIN   Take 1 tablet (250 mg total) by mouth daily.      morphine 20 MG/ML concentrated solution   Commonly known as: ROXANOL   Take 0.25 mLs (5 mg total) by mouth every 2 (two) hours as needed.      nebivolol 5 MG tablet   Commonly known as: BYSTOLIC   Take 2.5 mg by mouth every morning. Take 1/2 (half) tab qd      nitroGLYCERIN 0.4 MG SL tablet   Commonly known as: NITROSTAT   Place 0.4 mg under the tongue every 5 (five) minutes x 3 doses as needed. For chest pain      NON FORMULARY   OXYGEN    As directed      omeprazole 20 MG capsule   Commonly known as: PRILOSEC   Take 20-40 mg by mouth 2 (two) times daily. Take 2 capsules in the morning and 1 capsule in the evenings      oxybutynin 5 MG tablet   Commonly known  as: DITROPAN   Take 1 tablet (5 mg total) by mouth every 8 (eight) hours as needed.      Oxycodone HCl 10 MG Tabs   Take 1 tablet (10 mg total) by mouth 3 (three) times daily.      pravastatin 40 MG tablet   Commonly known as: PRAVACHOL   Take 40 mg by mouth every evening.      ranolazine 500 MG 12 hr tablet   Commonly known as: RANEXA   Take 500 mg by mouth every morning.      senna-docusate 8.6-50 MG per tablet   Commonly known as: Senokot-S   Take 3 tablets by mouth 2 (two) times daily.      sertraline 100 MG tablet   Commonly known as: ZOLOFT   Take 100 mg by mouth every morning.      XOPENEX HFA 45 MCG/ACT inhaler   Generic drug: levalbuterol   Inhale 1-2 puffs into the lungs every 4 (four) hours as needed. For shortness of breath           Follow-up Information    Follow up with GREEN, Lenon Curt, MD. In 2 weeks.   Contact information:   [address] Jeanella Anton [place] [address] [phone]           The results of significant diagnostics from this hospitalization (including imaging, microbiology, ancillary and laboratory) are listed below for reference.     Microbiology: Recent Results (from the past 240 hour(s))  URINE CULTURE     Status: Normal   Collection Time   01/25/12  8:21 AM      Component Value Range Status Comment   Specimen Description URINE, CATHETERIZED   Final    Special Requests NONE   Final    Culture  Setup Time 01/25/2012 14:06   Final    Colony  Count NO GROWTH   Final    Culture NO GROWTH   Final    Report Status 01/26/2012 FINAL   Final      Labs: Basic Metabolic Panel:  Lab 01/30/12 4696 01/29/12 0650 01/28/12 0523 01/27/12 0455  NA 139 137 137 137  K 3.6 3.3* 3.7 3.8  CL 95* 94* 96 97  CO2 35* 31 33* 30  GLUCOSE 153* 73 71 104*  BUN 56* 56* 51* 45*  CREATININE 1.80* 1.96* 2.21* 2.07*  CALCIUM 9.6 9.4 9.3 9.1  MG -- -- -- --  PHOS -- -- -- --   CBC:  Lab 01/30/12 0945 01/28/12 0523 01/27/12 0455  WBC 6.7 8.2 7.6   NEUTROABS -- -- --  HGB 10.6* 10.2* 9.8*  HCT 32.9* 31.8* 30.3*  MCV 95.1 95.5 96.2  PLT 167 165 165    SIGNED: Time coordinating discharge: Over 30 minutes  Debbora Presto, MD  [facility] 02/02/2012, 10:20 AM Pager [phone]  If 7PM-7AM, please contact night-coverage [email or web address] Password [email or web address]

## 2012-02-02 NOTE — Progress Notes (Signed)
Clinical Social Worker spoke with pt daughter, Delice Bison who confirmed choice of [facility]. Clinical Social Worker spoke with [facility] who confirmed bed availability for today and asked that family come to facility to complete paperwork at 2:30pm. Clinical Social Worker discussed with pt daughter who is agreeable. MD notified. Clinical Social Worker to facilitate pt discharge needs to [facility] this afternoon.  Jacklynn Lewis, MSW, LCSWA  Clinical Social Work [phone]

## 2012-02-02 NOTE — Progress Notes (Signed)
Room 1326 - Dustin [name] - [facility] RN Visit-R.[name] RN  Related admission to [facility] diagnosis of CHF.  Pt is DNR code with OOF DNR on shadow chart.    Pt lying in bed, sleeping soundly and appears comfortable.  Dtr [name] present - states pt has severe pain in feet due to gout - Allipurinol has been administered in the hospital and is on the discharge med list.  Dtr states they have accepted bed at [facility] for rehab.  She discussed her plan for patient: rehab at [facility] and if this goes well, having pt return home with full time private paid caregivers and have [facility] home services restarted.  Dtr is aware how to contact [facility] when this plan plays out.  Family very appreciative of patient's services from [facility].  Patient's home medication list is on shadow chart.   Please call [facility] @ [phone]- ask for RN Liaison or after hours,ask for on-call RN with any hospice needs.    Thank you.  [name], RN  [facility]  Hospice Liaison

## 2012-02-02 NOTE — Progress Notes (Signed)
Pt assessment unchanged from this morning; pt stable for DC to [facility] per MD orders.  DC instructions/medications reviewed with pt dtr Matissa Knowels.  Report called to [facility] @ 1610.  [facility] notified for transport @ 1600; informed it wd be at least 1 hour before pickup.

## 2012-04-03 ENCOUNTER — Encounter: Payer: Self-pay | Admitting: Internal Medicine

## 2012-04-03 ENCOUNTER — Other Ambulatory Visit: Payer: Self-pay | Admitting: Internal Medicine

## 2012-04-03 ENCOUNTER — Ambulatory Visit (INDEPENDENT_AMBULATORY_CARE_PROVIDER_SITE_OTHER): Admitting: *Deleted

## 2012-04-03 DIAGNOSIS — I5022 Chronic systolic (congestive) heart failure: Secondary | ICD-10-CM

## 2012-04-03 DIAGNOSIS — I2589 Other forms of chronic ischemic heart disease: Secondary | ICD-10-CM

## 2012-04-03 DIAGNOSIS — Z95810 Presence of automatic (implantable) cardiac defibrillator: Secondary | ICD-10-CM

## 2012-04-06 LAB — REMOTE ICD DEVICE
BAMS-0001: 170 {beats}/min
BATTERY VOLTAGE: 3.0026 V
FVT: 0
PACEART VT: 0
RV LEAD AMPLITUDE: 12.6 mv
TZAT-0001ATACH: 3
TZAT-0002ATACH: NEGATIVE
TZAT-0002ATACH: NEGATIVE
TZAT-0002ATACH: NEGATIVE
TZAT-0005SLOWVT: 88 pct
TZAT-0005SLOWVT: 91 pct
TZAT-0011SLOWVT: 10 ms
TZAT-0011SLOWVT: 10 ms
TZAT-0012ATACH: 150 ms
TZAT-0012SLOWVT: 200 ms
TZAT-0012SLOWVT: 200 ms
TZAT-0018ATACH: NEGATIVE
TZAT-0019ATACH: 6 V
TZAT-0019SLOWVT: 8 V
TZAT-0019SLOWVT: 8 V
TZAT-0020FASTVT: 1.5 ms
TZON-0003ATACH: 350 ms
TZON-0003SLOWVT: 340 ms
TZON-0005SLOWVT: 12
TZST-0001ATACH: 6
TZST-0001FASTVT: 2
TZST-0001FASTVT: 3
TZST-0001SLOWVT: 4
TZST-0001SLOWVT: 6
TZST-0002ATACH: NEGATIVE
TZST-0002ATACH: NEGATIVE
TZST-0002FASTVT: NEGATIVE
TZST-0002FASTVT: NEGATIVE
TZST-0003SLOWVT: 15 J
TZST-0003SLOWVT: 35 J
TZST-0003SLOWVT: 35 J

## 2012-04-26 ENCOUNTER — Encounter: Payer: Self-pay | Admitting: *Deleted

## 2012-05-12 ENCOUNTER — Other Ambulatory Visit: Payer: Self-pay | Admitting: *Deleted

## 2012-05-12 MED ORDER — ALBUTEROL SULFATE (2.5 MG/3ML) 0.083% IN NEBU: INHALATION_SOLUTION | RESPIRATORY_TRACT | Status: DC

## 2012-05-12 MED ORDER — RANOLAZINE ER 500 MG PO TB12: 500.0000 mg | ORAL_TABLET | Freq: Every morning | ORAL | Status: DC

## 2012-05-15 ENCOUNTER — Other Ambulatory Visit: Payer: Self-pay | Admitting: *Deleted

## 2012-05-15 MED ORDER — ALBUTEROL SULFATE (2.5 MG/3ML) 0.083% IN NEBU: INHALATION_SOLUTION | RESPIRATORY_TRACT | Status: AC

## 2012-06-01 ENCOUNTER — Encounter (HOSPITAL_COMMUNITY): Payer: Self-pay | Admitting: Emergency Medicine

## 2012-06-01 ENCOUNTER — Inpatient Hospital Stay (HOSPITAL_COMMUNITY)
Admission: EM | Admit: 2012-06-01 | Discharge: 2012-06-12 | DRG: 292 | Disposition: A | Discharge status: Skilled Nursing Facility | Attending: Cardiovascular Disease | Admitting: Cardiovascular Disease

## 2012-06-01 ENCOUNTER — Other Ambulatory Visit: Payer: Self-pay

## 2012-06-01 ENCOUNTER — Emergency Department (HOSPITAL_COMMUNITY)

## 2012-06-01 DIAGNOSIS — J4489 Other specified chronic obstructive pulmonary disease: Secondary | ICD-10-CM | POA: Diagnosis present

## 2012-06-01 DIAGNOSIS — E119 Type 2 diabetes mellitus without complications: Secondary | ICD-10-CM | POA: Diagnosis present

## 2012-06-01 DIAGNOSIS — G4733 Obstructive sleep apnea (adult) (pediatric): Secondary | ICD-10-CM | POA: Diagnosis present

## 2012-06-01 DIAGNOSIS — Z515 Encounter for palliative care: Secondary | ICD-10-CM

## 2012-06-01 DIAGNOSIS — I5043 Acute on chronic combined systolic (congestive) and diastolic (congestive) heart failure: Principal | ICD-10-CM | POA: Diagnosis present

## 2012-06-01 DIAGNOSIS — K5900 Constipation, unspecified: Secondary | ICD-10-CM | POA: Diagnosis not present

## 2012-06-01 DIAGNOSIS — I739 Peripheral vascular disease, unspecified: Secondary | ICD-10-CM | POA: Diagnosis present

## 2012-06-01 DIAGNOSIS — I2510 Atherosclerotic heart disease of native coronary artery without angina pectoris: Secondary | ICD-10-CM | POA: Diagnosis present

## 2012-06-01 DIAGNOSIS — I5022 Chronic systolic (congestive) heart failure: Secondary | ICD-10-CM | POA: Diagnosis present

## 2012-06-01 DIAGNOSIS — Z951 Presence of aortocoronary bypass graft: Secondary | ICD-10-CM

## 2012-06-01 DIAGNOSIS — I658 Occlusion and stenosis of other precerebral arteries: Secondary | ICD-10-CM | POA: Diagnosis present

## 2012-06-01 DIAGNOSIS — Z95810 Presence of automatic (implantable) cardiac defibrillator: Secondary | ICD-10-CM

## 2012-06-01 DIAGNOSIS — R0602 Shortness of breath: Secondary | ICD-10-CM

## 2012-06-01 DIAGNOSIS — R609 Edema, unspecified: Secondary | ICD-10-CM | POA: Diagnosis present

## 2012-06-01 DIAGNOSIS — N183 Chronic kidney disease, stage 3 unspecified: Secondary | ICD-10-CM | POA: Diagnosis present

## 2012-06-01 DIAGNOSIS — Y832 Surgical operation with anastomosis, bypass or graft as the cause of abnormal reaction of the patient, or of later complication, without mention of misadventure at the time of the procedure: Secondary | ICD-10-CM | POA: Diagnosis present

## 2012-06-01 DIAGNOSIS — Z7982 Long term (current) use of aspirin: Secondary | ICD-10-CM

## 2012-06-01 DIAGNOSIS — Z87820 Personal history of traumatic brain injury: Secondary | ICD-10-CM

## 2012-06-01 DIAGNOSIS — R0609 Other forms of dyspnea: Secondary | ICD-10-CM | POA: Diagnosis not present

## 2012-06-01 DIAGNOSIS — R1310 Dysphagia, unspecified: Secondary | ICD-10-CM | POA: Diagnosis present

## 2012-06-01 DIAGNOSIS — I509 Heart failure, unspecified: Secondary | ICD-10-CM | POA: Diagnosis present

## 2012-06-01 DIAGNOSIS — Z66 Do not resuscitate: Secondary | ICD-10-CM | POA: Diagnosis present

## 2012-06-01 DIAGNOSIS — R0989 Other specified symptoms and signs involving the circulatory and respiratory systems: Secondary | ICD-10-CM | POA: Diagnosis present

## 2012-06-01 DIAGNOSIS — E876 Hypokalemia: Secondary | ICD-10-CM | POA: Diagnosis not present

## 2012-06-01 DIAGNOSIS — T82897A Other specified complication of cardiac prosthetic devices, implants and grafts, initial encounter: Secondary | ICD-10-CM | POA: Diagnosis present

## 2012-06-01 DIAGNOSIS — Z79899 Other long term (current) drug therapy: Secondary | ICD-10-CM

## 2012-06-01 DIAGNOSIS — Z87891 Personal history of nicotine dependence: Secondary | ICD-10-CM

## 2012-06-01 DIAGNOSIS — C9201 Acute myeloblastic leukemia, in remission: Secondary | ICD-10-CM | POA: Diagnosis present

## 2012-06-01 DIAGNOSIS — I252 Old myocardial infarction: Secondary | ICD-10-CM

## 2012-06-01 DIAGNOSIS — Z882 Allergy status to sulfonamides status: Secondary | ICD-10-CM

## 2012-06-01 DIAGNOSIS — Z794 Long term (current) use of insulin: Secondary | ICD-10-CM

## 2012-06-01 DIAGNOSIS — J449 Chronic obstructive pulmonary disease, unspecified: Secondary | ICD-10-CM | POA: Diagnosis present

## 2012-06-01 DIAGNOSIS — I472 Ventricular tachycardia, unspecified: Secondary | ICD-10-CM | POA: Diagnosis not present

## 2012-06-01 DIAGNOSIS — E785 Hyperlipidemia, unspecified: Secondary | ICD-10-CM | POA: Diagnosis present

## 2012-06-01 DIAGNOSIS — Z8249 Family history of ischemic heart disease and other diseases of the circulatory system: Secondary | ICD-10-CM

## 2012-06-01 DIAGNOSIS — I10 Essential (primary) hypertension: Secondary | ICD-10-CM | POA: Diagnosis present

## 2012-06-01 DIAGNOSIS — Z885 Allergy status to narcotic agent status: Secondary | ICD-10-CM

## 2012-06-01 DIAGNOSIS — R0601 Orthopnea: Secondary | ICD-10-CM | POA: Diagnosis present

## 2012-06-01 DIAGNOSIS — I4729 Other ventricular tachycardia: Secondary | ICD-10-CM | POA: Diagnosis not present

## 2012-06-01 DIAGNOSIS — I6529 Occlusion and stenosis of unspecified carotid artery: Secondary | ICD-10-CM | POA: Diagnosis present

## 2012-06-01 DIAGNOSIS — I25810 Atherosclerosis of coronary artery bypass graft(s) without angina pectoris: Secondary | ICD-10-CM | POA: Diagnosis present

## 2012-06-01 DIAGNOSIS — I2589 Other forms of chronic ischemic heart disease: Secondary | ICD-10-CM | POA: Diagnosis present

## 2012-06-01 DIAGNOSIS — I129 Hypertensive chronic kidney disease with stage 1 through stage 4 chronic kidney disease, or unspecified chronic kidney disease: Secondary | ICD-10-CM | POA: Diagnosis present

## 2012-06-01 DIAGNOSIS — K649 Unspecified hemorrhoids: Secondary | ICD-10-CM | POA: Diagnosis not present

## 2012-06-01 DIAGNOSIS — I2789 Other specified pulmonary heart diseases: Secondary | ICD-10-CM | POA: Diagnosis present

## 2012-06-01 HISTORY — DX: Acute myocardial infarction, unspecified: I21.9

## 2012-06-01 HISTORY — DX: Acute myeloblastic leukemia, in remission: C92.01

## 2012-06-01 HISTORY — DX: Atherosclerotic heart disease of native coronary artery without angina pectoris: I25.10

## 2012-06-01 HISTORY — DX: Inclusion body myositis (IBM): G72.41

## 2012-06-01 HISTORY — DX: Presence of automatic (implantable) cardiac defibrillator: Z95.810

## 2012-06-01 HISTORY — DX: Shortness of breath: R06.02

## 2012-06-01 HISTORY — DX: Malignant (primary) neoplasm, unspecified: C80.1

## 2012-06-01 HISTORY — DX: Cardiac arrhythmia, unspecified: I49.9

## 2012-06-01 HISTORY — DX: Type 2 diabetes mellitus without complications: E11.9

## 2012-06-01 HISTORY — DX: Gastro-esophageal reflux disease without esophagitis: K21.9

## 2012-06-01 HISTORY — DX: Heart failure, unspecified: I50.9

## 2012-06-01 HISTORY — DX: Encounter for palliative care: Z51.5

## 2012-06-01 HISTORY — DX: Angina pectoris, unspecified: I20.9

## 2012-06-01 LAB — POCT I-STAT TROPONIN I: Troponin i, poc: 0.04 ng/mL (ref 0.00–0.08)

## 2012-06-01 LAB — BASIC METABOLIC PANEL
GFR calc Af Amer: 49 mL/min — ABNORMAL LOW (ref >90)
GFR calc non Af Amer: 42 mL/min — ABNORMAL LOW (ref >90)
Glucose, Bld: 83 mg/dL (ref 70–99)
Potassium: 3.8 mEq/L (ref 3.5–5.1)
Sodium: 142 mEq/L (ref 135–145)

## 2012-06-01 LAB — CBC
Hemoglobin: 11.2 g/dL — ABNORMAL LOW (ref 13.0–17.0)
MCHC: 32.9 g/dL (ref 30.0–36.0)
RDW: 16.2 % — ABNORMAL HIGH (ref 11.5–15.5)

## 2012-06-01 MED ORDER — ONDANSETRON HCL 4 MG/2ML IJ SOLN
4.0000 mg | Freq: Once | INTRAMUSCULAR | Status: AC
  Administered 2012-06-01: 4 mg via INTRAVENOUS
  Filled 2012-06-01: qty 2

## 2012-06-01 MED ORDER — FUROSEMIDE 10 MG/ML IJ SOLN
40.0000 mg | Freq: Once | INTRAMUSCULAR | Status: AC
  Administered 2012-06-01: 40 mg via INTRAVENOUS
  Filled 2012-06-01: qty 4

## 2012-06-01 MED ORDER — MORPHINE SULFATE 4 MG/ML IJ SOLN
4.0000 mg | Freq: Once | INTRAMUSCULAR | Status: AC
  Administered 2012-06-01: 4 mg via INTRAVENOUS
  Filled 2012-06-01: qty 1

## 2012-06-01 MED ORDER — NITROGLYCERIN 2 % TD OINT
1.0000 [in_us] | TOPICAL_OINTMENT | Freq: Four times a day (QID) | TRANSDERMAL | Status: DC
  Administered 2012-06-01 – 2012-06-05 (×14): 1 [in_us] via TOPICAL
  Filled 2012-06-01: qty 1
  Filled 2012-06-01: qty 30

## 2012-06-01 NOTE — ED Provider Notes (Signed)
History     CSN: [id number]  Arrival date & time 06/01/12  2006   First MD Initiated Contact with Patient 06/01/12 2041      Chief Complaint  Patient presents with  . Shortness of Breath    (Consider location/radiation/quality/duration/timing/severity/associated sxs/prior treatment) Patient is a 79 y.o. male presenting with shortness of breath. The history is provided by the patient and a relative.  Shortness of Breath Associated symptoms: cough   Associated symptoms: no abdominal pain, no fever, no headaches, no neck pain and no rash   pt with hx ischemic cm, chf, presents c/o worsening sob, bilateral leg and abd swelling, orthopnea for the past few days. Slowly worse. Felt tight in chest for past day, constant. No associated nv or diaphoresis. No fever or chills. Non productive cough. No unilateral leg pain or swelling. No pleuritic cp. States compliant w normal meds, unaware of recent change in meds. States urinating but not as much as normal. Unaware of wt change, but feels has gained some wt.   Past Medical History  Diagnosis Date  . IHD (ischemic heart disease)      -  non-ST elevation MI in July 2009. and NSTEMI Jan 2010 in setting of RLL CAP. 99% diagonal  disease s/p stent July of 2009. Cath Jan 2010 showed  occulusion of SVG to RCA   LVEF 45%. rec med rx   - Echo 10/29/08 EF 25%  . PAD (peripheral artery disease)   . Hyperlipidemia   . Hypertension   . Obstructive sleep apnea      Refuses to wear his CPAP.  Not on oxygen   . Vocal cord dysfunction     with Botox injection at [facility] around 2005  per patient.     - Patient's swallowing difficulties may be related to vocal    cord dysfunction, recommended to be followed up at [facility]  . Stroke     History of left brain cerebrovascular accident in 1998 without   . Dysphagia       -swallowing study nl oropharyngeal swallow, brief pause  at top of the esophagus which clears with liquid wash or  small bites.    Rec  regular diet with thin liquids and continue management of    esophageal dysmotility.   - Barium swallow 10/31/08  mild dilation upper esosphagus, moderate slowing, can't r/o mass  . Anemia         03/21/2008 hgb 10.9, MCV 86, Stool occult blood postiive -> On IV Iron   . COPD (chronic obstructive pulmonary disease)     - Hypoxemia, former smoker-  FEV1 1.86 (72%) , ratio 68% (07/26/08) - FEV1 1.61 (62%) , ratio 74    ( 10/29/08) with no insp or exp truncation on f/v loop    Past Surgical History  Procedure Laterality Date  . Coronary artery bypass graft    . Abdominal aortic aneurysm repair    . Aortobifemoral bypass graft in the past    . Icd insertion      ICD [employer]onic    Family History  Problem Relation Age of Onset  . COPD Sister   . Heart disease Sister   . Alzheimer's disease Sister   . Lung cancer Sister   . Heart disease Brother   . Alzheimer's disease Brother   . Alzheimer's disease Mother     History  Substance Use Topics  . Smoking status: Former Smoker -- 0.50 packs/day for  15 years    Types: Cigarettes    Quit date: 02/23/1980  . Smokeless tobacco: Current User    Types: Chew  . Alcohol Use: Yes      Review of Systems  Constitutional: Negative for fever and chills.  HENT: Negative for neck pain.   Eyes: Negative for redness.  Respiratory: Positive for cough and shortness of breath.   Cardiovascular: Positive for leg swelling.  Gastrointestinal: Negative for abdominal pain.  Endocrine: Negative for polyuria.  Genitourinary: Negative for flank pain.  Musculoskeletal: Negative for back pain.  Skin: Negative for rash.  Neurological: Negative for headaches.  Hematological: Does not bruise/bleed easily.  Psychiatric/Behavioral: Negative for confusion.    Allergies  Codeine; Inderal; and Sulfamethoxazole w-trimethoprim  Home Medications   Current Outpatient Rx  Name  Route  Sig  Dispense  Refill  . albuterol (PROVENTIL) (2.5 MG/3ML)  0.083% nebulizer solution      Inhale 1 unit dose vial nebulizer  4  times a day as needed for shortness of breathe.   30 mL   3   . allopurinol (ZYLOPRIM) 300 MG tablet   Oral   Take 300 mg by mouth every evening.          Marland Kitchen ALPRAZolam (XANAX) 1 MG tablet   Oral   Take 1 tablet (1 mg total) by mouth at bedtime.   45 tablet   0   . ALPRAZolam (XANAX) 1 MG tablet   Oral   Take 1 tablet (1 mg total) by mouth 3 (three) times daily as needed for anxiety.   45 tablet   0   . aspirin 81 MG tablet   Oral   Take 81 mg by mouth every morning.          . benzonatate (TESSALON) 200 MG capsule   Oral   Take 200 mg by mouth 3 (three) times daily as needed. For cough         . cetirizine (ZYRTEC) 10 MG chewable tablet   Oral   Chew 10 mg by mouth every morning.          . cholecalciferol (VITAMIN D) 1000 UNITS tablet   Oral   Take 2,000 Units by mouth every evening.         . diphenhydrAMINE (BENADRYL) 25 MG tablet   Oral   Take 50 mg by mouth at bedtime.         . furosemide (LASIX) 40 MG tablet   Oral   Take 1 tablet (40 mg total) by mouth daily.   30 tablet   0   . glipiZIDE (GLUCOTROL) 5 MG tablet   Oral   Take 5 mg by mouth every morning.          Marland Kitchen guaiFENesin (MUCINEX) 600 MG 12 hr tablet   Oral   Take 600-1,200 mg by mouth every 12 (twelve) hours as needed. For cough         . iron polysaccharides (NIFEREX) 150 MG capsule   Oral   Take 150 mg by mouth every morning.          . isosorbide-hydrALAZINE (BIDIL) 20-37.5 MG per tablet   Oral   Take 1 tablet by mouth 3 (three) times daily.          Marland Kitchen levalbuterol (XOPENEX HFA) 45 MCG/ACT inhaler   Inhalation   Inhale 1-2 puffs into the lungs every 4 (four) hours as needed. For shortness of breath         .  levofloxacin (LEVAQUIN) 250 MG tablet   Oral   Take 1 tablet (250 mg total) by mouth daily.   7 tablet   0   . morphine (ROXANOL) 20 MG/ML concentrated solution   Oral   Take 0.25  mLs (5 mg total) by mouth every 2 (two) hours as needed.   30 mL   0   . nebivolol (BYSTOLIC) 5 MG tablet   Oral   Take 2.5 mg by mouth every morning. Take 1/2 (half) tab qd         . nitroGLYCERIN (NITROSTAT) 0.4 MG SL tablet   Sublingual   Place 0.4 mg under the tongue every 5 (five) minutes x 3 doses as needed. For chest pain         . NON FORMULARY      OXYGEN  As directed         . omeprazole (PRILOSEC) 20 MG capsule   Oral   Take 20-40 mg by mouth 2 (two) times daily. Take 2 capsules in the morning and 1 capsule in the evenings         . oxybutynin (DITROPAN) 5 MG tablet   Oral   Take 1 tablet (5 mg total) by mouth every 8 (eight) hours as needed.   45 tablet   0   . oxyCODONE 10 MG TABS   Oral   Take 1 tablet (10 mg total) by mouth 3 (three) times daily.   45 tablet   0   . pravastatin (PRAVACHOL) 40 MG tablet   Oral   Take 40 mg by mouth every evening.          . ranolazine (RANEXA) 500 MG 12 hr tablet   Oral   Take 1 tablet (500 mg total) by mouth every morning.   30 tablet   5   . senna-docusate (SENOKOT-S) 8.6-50 MG per tablet   Oral   Take 3 tablets by mouth 2 (two) times daily.         . sertraline (ZOLOFT) 100 MG tablet   Oral   Take 100 mg by mouth every morning.            BP 138/69  Temp(Src) 99.4 F (37.4 C) (Oral)  Resp 22  SpO2 92%  Physical Exam  Nursing note and vitals reviewed. Constitutional: He appears well-developed and well-nourished. No distress.  HENT:  Nose: Nose normal.  Mouth/Throat: Oropharynx is clear and moist.  Eyes: Conjunctivae are normal.  Neck: Neck supple. No tracheal deviation present.  Cardiovascular: Normal rate, regular rhythm, normal heart sounds and intact distal pulses.   Pulmonary/Chest: Effort normal and breath sounds normal. No accessory muscle usage. No respiratory distress.  Abdominal: Soft. Bowel sounds are normal. He exhibits no distension. There is no tenderness.   Musculoskeletal: Normal range of motion. He exhibits edema.  Bilateral leg edema to thigh  Neurological: He is alert.  Skin: Skin is warm and dry.  Psychiatric: He has a normal mood and affect.    ED Course  Procedures (including critical care time)  Results for orders placed during the hospital encounter of 06/01/12  CBC      Result Value Range   WBC 6.1  4.0 - 10.5 K/uL   RBC 3.70 (*) 4.22 - 5.81 MIL/uL   Hemoglobin 11.2 (*) 13.0 - 17.0 g/dL   HCT 16.1 (*) 09.6 - 04.5 %   MCV 91.9  78.0 - 100.0 fL   MCH 30.3  26.0 - 34.0 pg  MCHC 32.9  30.0 - 36.0 g/dL   RDW 16.1 (*) 09.6 - 04.5 %   Platelets 151  150 - 400 K/uL  BASIC METABOLIC PANEL      Result Value Range   Sodium 142  135 - 145 mEq/L   Potassium 3.8  3.5 - 5.1 mEq/L   Chloride 104  96 - 112 mEq/L   CO2 33 (*) 19 - 32 mEq/L   Glucose, Bld 83  70 - 99 mg/dL   BUN 26 (*) 6 - 23 mg/dL   Creatinine, Ser 4.09 (*) 0.50 - 1.35 mg/dL   Calcium 9.6  8.4 - 81.1 mg/dL   GFR calc non Af Amer 42 (*) >90 mL/min   GFR calc Af Amer 49 (*) >90 mL/min  PRO B NATRIURETIC PEPTIDE      Result Value Range   Pro B Natriuretic peptide (BNP) 11748.0 (*) 0 - 450 pg/mL  POCT I-STAT TROPONIN I      Result Value Range   Troponin i, poc 0.04  0.00 - 0.08 ng/mL   Comment 3            Dg Chest Port 1 View  06/01/2012  *RADIOLOGY REPORT*  Clinical Data: Chest pain and shortness of breath.  PORTABLE CHEST - 1 VIEW  Comparison: Two-view chest 01/27/2012.  Findings: The heart is enlarged.  There is mild pulmonary vascular congestion.  No focal airspace consolidation is evident.  The visualized soft tissues and bony thorax are unremarkable.  IMPRESSION:  1.  Stable cardiomegaly. 2.  Mild pulmonary vascular congestion.   Original Report Authenticated By: Marin Roberts, M.D.        MDM  Iv ns. Continuous pulse ox and monitor. Labs. Ecg.  Reviewed nursing notes and prior charts for additional history.    Date: 06/01/2012  Rate: 81   Rhythm: paced  QRS Axis: left  Intervals: paced  ST/T Wave abnormalities: nonspecific ST/T changes  Conduction Disutrbances:paced  Narrative Interpretation:   Old EKG Reviewed: unchanged  Lasix iv.  Given dyspnea, edema, orthopnea, vascular congestion, will admit for chf exacerbation.  Discussed w [facility].         Suzi Roots, MD 06/01/12 [phone]

## 2012-06-01 NOTE — ED Notes (Signed)
Admitting MD in room with pt. 

## 2012-06-01 NOTE — ED Notes (Signed)
BIB [facility]. Transport from home. Called out for increasing SOB. CHF Hx. Also abdominal distension with discomfort. 2L home O2. Pacemaker/defib in situ (demand pacing). Hospice patient related with CHF. EMS transport: 6L [place], Left hand 20g. Nitro x1 (patient takes daily nitro), SOB, Rales in lower bilateral bases

## 2012-06-02 ENCOUNTER — Encounter (HOSPITAL_COMMUNITY): Payer: Self-pay | Admitting: *Deleted

## 2012-06-02 DIAGNOSIS — E785 Hyperlipidemia, unspecified: Secondary | ICD-10-CM | POA: Diagnosis present

## 2012-06-02 DIAGNOSIS — I25810 Atherosclerosis of coronary artery bypass graft(s) without angina pectoris: Secondary | ICD-10-CM | POA: Insufficient documentation

## 2012-06-02 DIAGNOSIS — I5043 Acute on chronic combined systolic (congestive) and diastolic (congestive) heart failure: Secondary | ICD-10-CM | POA: Diagnosis present

## 2012-06-02 DIAGNOSIS — Z95810 Presence of automatic (implantable) cardiac defibrillator: Secondary | ICD-10-CM

## 2012-06-02 DIAGNOSIS — I509 Heart failure, unspecified: Secondary | ICD-10-CM

## 2012-06-02 DIAGNOSIS — E119 Type 2 diabetes mellitus without complications: Secondary | ICD-10-CM | POA: Diagnosis present

## 2012-06-02 DIAGNOSIS — I10 Essential (primary) hypertension: Secondary | ICD-10-CM | POA: Diagnosis present

## 2012-06-02 DIAGNOSIS — R0602 Shortness of breath: Secondary | ICD-10-CM

## 2012-06-02 LAB — URINE CULTURE

## 2012-06-02 LAB — BASIC METABOLIC PANEL
CO2: 31 mEq/L (ref 19–32)
Chloride: 104 mEq/L (ref 96–112)
Creatinine, Ser: 1.34 mg/dL (ref 0.50–1.35)
Potassium: 3.3 mEq/L — ABNORMAL LOW (ref 3.5–5.1)
Sodium: 141 mEq/L (ref 135–145)

## 2012-06-02 LAB — GLUCOSE, CAPILLARY
Glucose-Capillary: 94 mg/dL (ref 70–99)
Glucose-Capillary: 92 mg/dL (ref 70–99)
Glucose-Capillary: 148 mg/dL — ABNORMAL HIGH (ref 70–99)
Glucose-Capillary: 121 mg/dL — ABNORMAL HIGH (ref 70–99)

## 2012-06-02 LAB — URINALYSIS, MICROSCOPIC ONLY
Glucose, UA: NEGATIVE mg/dL
Leukocytes, UA: NEGATIVE
Specific Gravity, Urine: 1.011 (ref 1.005–1.030)
pH: 5 (ref 5.0–8.0)

## 2012-06-02 LAB — TSH: TSH: 2.076 u[IU]/mL (ref 0.350–4.500)

## 2012-06-02 LAB — HEMOGLOBIN A1C: Mean Plasma Glucose: 128 mg/dL — ABNORMAL HIGH (ref <117)

## 2012-06-02 MED ORDER — ALBUTEROL SULFATE (5 MG/ML) 0.5% IN NEBU
2.5000 mg | INHALATION_SOLUTION | RESPIRATORY_TRACT | Status: DC | PRN
  Administered 2012-06-03: 2.5 mg via RESPIRATORY_TRACT
  Filled 2012-06-02: qty 0.5

## 2012-06-02 MED ORDER — ASPIRIN EC 81 MG PO TBEC
81.0000 mg | DELAYED_RELEASE_TABLET | Freq: Every day | ORAL | Status: DC
  Administered 2012-06-02 – 2012-06-12 (×11): 81 mg via ORAL
  Filled 2012-06-02 (×11): qty 1

## 2012-06-02 MED ORDER — VITAMIN D3 25 MCG (1000 UNIT) PO TABS
2000.0000 [IU] | ORAL_TABLET | Freq: Every evening | ORAL | Status: DC
  Administered 2012-06-02 – 2012-06-11 (×10): 2000 [IU] via ORAL
  Filled 2012-06-02 (×11): qty 2

## 2012-06-02 MED ORDER — ALLOPURINOL 300 MG PO TABS
300.0000 mg | ORAL_TABLET | Freq: Every evening | ORAL | Status: DC
  Administered 2012-06-02 – 2012-06-11 (×10): 300 mg via ORAL
  Filled 2012-06-02 (×11): qty 1

## 2012-06-02 MED ORDER — ISOSORB DINITRATE-HYDRALAZINE 20-37.5 MG PO TABS
1.0000 | ORAL_TABLET | Freq: Three times a day (TID) | ORAL | Status: DC
  Administered 2012-06-02 (×3): 1 via ORAL
  Filled 2012-06-02 (×6): qty 1

## 2012-06-02 MED ORDER — RANOLAZINE ER 500 MG PO TB12
500.0000 mg | ORAL_TABLET | Freq: Every morning | ORAL | Status: DC
  Administered 2012-06-02 – 2012-06-12 (×11): 500 mg via ORAL
  Filled 2012-06-02 (×11): qty 1

## 2012-06-02 MED ORDER — DIPHENHYDRAMINE HCL 25 MG PO TABS
50.0000 mg | ORAL_TABLET | Freq: Every day | ORAL | Status: DC
Start: 2012-06-02 — End: 2012-06-02

## 2012-06-02 MED ORDER — ENOXAPARIN SODIUM 30 MG/0.3ML ~~LOC~~ SOLN
30.0000 mg | Freq: Every day | SUBCUTANEOUS | Status: DC
  Administered 2012-06-02 – 2012-06-12 (×11): 30 mg via SUBCUTANEOUS
  Filled 2012-06-02 (×11): qty 0.3

## 2012-06-02 MED ORDER — PANTOPRAZOLE SODIUM 40 MG PO TBEC
40.0000 mg | DELAYED_RELEASE_TABLET | Freq: Every day | ORAL | Status: DC
  Administered 2012-06-02 – 2012-06-12 (×11): 40 mg via ORAL
  Filled 2012-06-02 (×10): qty 1

## 2012-06-02 MED ORDER — INSULIN ASPART 100 UNIT/ML ~~LOC~~ SOLN
0.0000 [IU] | Freq: Three times a day (TID) | SUBCUTANEOUS | Status: DC
  Administered 2012-06-02 – 2012-06-03 (×4): 1 [IU] via SUBCUTANEOUS
  Administered 2012-06-04 (×2): 2 [IU] via SUBCUTANEOUS
  Administered 2012-06-05 (×2): 1 [IU] via SUBCUTANEOUS
  Administered 2012-06-06: 2 [IU] via SUBCUTANEOUS
  Administered 2012-06-06: 1 [IU] via SUBCUTANEOUS
  Administered 2012-06-06: 2 [IU] via SUBCUTANEOUS
  Administered 2012-06-07: 1 [IU] via SUBCUTANEOUS
  Administered 2012-06-07: 3 [IU] via SUBCUTANEOUS
  Administered 2012-06-07: 1 [IU] via SUBCUTANEOUS
  Administered 2012-06-08: 2 [IU] via SUBCUTANEOUS
  Administered 2012-06-08: 1 [IU] via SUBCUTANEOUS
  Administered 2012-06-08 – 2012-06-09 (×3): 2 [IU] via SUBCUTANEOUS
  Administered 2012-06-09: 1 [IU] via SUBCUTANEOUS
  Administered 2012-06-10: 3 [IU] via SUBCUTANEOUS
  Administered 2012-06-10: 1 [IU] via SUBCUTANEOUS
  Administered 2012-06-10 – 2012-06-11 (×3): 2 [IU] via SUBCUTANEOUS
  Administered 2012-06-11: 1 [IU] via SUBCUTANEOUS
  Administered 2012-06-12: 2 [IU] via SUBCUTANEOUS
  Administered 2012-06-12: 1 [IU] via SUBCUTANEOUS

## 2012-06-02 MED ORDER — POLYSACCHARIDE IRON COMPLEX 150 MG PO CAPS
150.0000 mg | ORAL_CAPSULE | Freq: Every morning | ORAL | Status: DC
  Administered 2012-06-02 – 2012-06-12 (×11): 150 mg via ORAL
  Filled 2012-06-02 (×11): qty 1

## 2012-06-02 MED ORDER — ALPRAZOLAM 0.5 MG PO TABS
1.0000 mg | ORAL_TABLET | Freq: Every day | ORAL | Status: DC
  Administered 2012-06-02 – 2012-06-11 (×10): 1 mg via ORAL
  Filled 2012-06-02 (×5): qty 2
  Filled 2012-06-02: qty 1
  Filled 2012-06-02 (×4): qty 2
  Filled 2012-06-02: qty 1

## 2012-06-02 MED ORDER — ONDANSETRON HCL 4 MG/2ML IJ SOLN: 4.0000 mg | Freq: Four times a day (QID) | INTRAMUSCULAR | Status: DC | PRN

## 2012-06-02 MED ORDER — ZOLPIDEM TARTRATE 5 MG PO TABS
5.0000 mg | ORAL_TABLET | Freq: Every day | ORAL | Status: DC
  Administered 2012-06-02 – 2012-06-06 (×4): 5 mg via ORAL
  Filled 2012-06-02 (×6): qty 1

## 2012-06-02 MED ORDER — SODIUM CHLORIDE 0.9 % IJ SOLN
3.0000 mL | Freq: Two times a day (BID) | INTRAMUSCULAR | Status: DC
  Administered 2012-06-02 – 2012-06-12 (×21): 3 mL via INTRAVENOUS

## 2012-06-02 MED ORDER — NEBIVOLOL HCL 2.5 MG PO TABS
2.5000 mg | ORAL_TABLET | Freq: Every morning | ORAL | Status: DC
  Administered 2012-06-02 – 2012-06-12 (×11): 2.5 mg via ORAL
  Filled 2012-06-02 (×11): qty 1

## 2012-06-02 MED ORDER — FUROSEMIDE 10 MG/ML IJ SOLN
40.0000 mg | Freq: Two times a day (BID) | INTRAMUSCULAR | Status: DC
  Administered 2012-06-02 – 2012-06-06 (×10): 40 mg via INTRAVENOUS
  Filled 2012-06-02 (×13): qty 4

## 2012-06-02 MED ORDER — MORPHINE SULFATE 2 MG/ML IJ SOLN
1.0000 mg | INTRAMUSCULAR | Status: DC | PRN
  Filled 2012-06-02: qty 1

## 2012-06-02 MED ORDER — IPRATROPIUM BROMIDE 0.02 % IN SOLN
0.5000 mg | RESPIRATORY_TRACT | Status: DC | PRN
  Administered 2012-06-03: 0.5 mg via RESPIRATORY_TRACT
  Filled 2012-06-02: qty 2.5

## 2012-06-02 MED ORDER — ASPIRIN 81 MG PO TABS: 81.0000 mg | ORAL_TABLET | Freq: Every day | ORAL | Status: DC

## 2012-06-02 MED ORDER — SIMVASTATIN 20 MG PO TABS
20.0000 mg | ORAL_TABLET | Freq: Every day | ORAL | Status: DC
  Administered 2012-06-02 – 2012-06-11 (×10): 20 mg via ORAL
  Filled 2012-06-02 (×11): qty 1

## 2012-06-02 MED ORDER — SENNOSIDES-DOCUSATE SODIUM 8.6-50 MG PO TABS
3.0000 | ORAL_TABLET | Freq: Two times a day (BID) | ORAL | Status: DC
  Administered 2012-06-02 – 2012-06-12 (×21): 3 via ORAL
  Filled 2012-06-02 (×22): qty 3

## 2012-06-02 MED ORDER — SODIUM CHLORIDE 0.9 % IV SOLN: 250.0000 mL | INTRAVENOUS | Status: DC | PRN

## 2012-06-02 MED ORDER — SODIUM CHLORIDE 0.9 % IJ SOLN
3.0000 mL | INTRAMUSCULAR | Status: DC | PRN
  Administered 2012-06-05: 3 mL via INTRAVENOUS

## 2012-06-02 MED ORDER — OXYCODONE HCL 5 MG PO TABS
10.0000 mg | ORAL_TABLET | Freq: Three times a day (TID) | ORAL | Status: DC
  Administered 2012-06-02 – 2012-06-12 (×30): 10 mg via ORAL
  Filled 2012-06-02 (×30): qty 2

## 2012-06-02 MED ORDER — NITROGLYCERIN 0.4 MG SL SUBL: 0.4000 mg | SUBLINGUAL_TABLET | SUBLINGUAL | Status: DC | PRN

## 2012-06-02 MED ORDER — POTASSIUM CHLORIDE CRYS ER 20 MEQ PO TBCR
40.0000 meq | EXTENDED_RELEASE_TABLET | Freq: Once | ORAL | Status: AC
  Administered 2012-06-02: 40 meq via ORAL
  Filled 2012-06-02: qty 2

## 2012-06-02 MED ORDER — SERTRALINE HCL 100 MG PO TABS
100.0000 mg | ORAL_TABLET | Freq: Every morning | ORAL | Status: DC
  Administered 2012-06-02 – 2012-06-12 (×11): 100 mg via ORAL
  Filled 2012-06-02 (×11): qty 1

## 2012-06-02 MED ORDER — ACETAMINOPHEN 325 MG PO TABS
650.0000 mg | ORAL_TABLET | ORAL | Status: DC | PRN
  Administered 2012-06-08: 650 mg via ORAL
  Filled 2012-06-02: qty 2

## 2012-06-02 NOTE — Progress Notes (Signed)
  Echocardiogram 2D Echocardiogram has been performed.  Ellender Hose A 06/02/2012, 3:14 PM

## 2012-06-02 NOTE — Progress Notes (Signed)
Room 4706 - Trino Castrillo - [facility] RN Visit-R.Shawne Bulow RN  Related admission to [facility] diagnosis of CHF.  Pt is DNR code.    Pt lying in bed, sound asleep and snoring, appears comfortable.  Son in Advertising account planner present at bedside. Patient's home medication list is on shadow chart.   Son in law shared pt had undergone 2-3 days of increasing SOB, lower extremity edema and expanding abdominal girth, chest pain.  Son shares pt is feeling much better, has episodes of low back pain discomfort.   Please call [facility] @ [phone]- ask for RN Liaison or after hours,ask for on-call RN with any hospice needs.   Thank you.  [name], RN  [facility]  Hospice Liaison

## 2012-06-02 NOTE — H&P (Addendum)
[facility] History and Physical  Dustin [name] WUJ:[id number] DOB: 1926/10/04 DOA: 06/01/2012   PCP: Kimber Relic, MD   Chief Complaint: shortness of breath  HPI:  79 year old male with a history of ischemic cardiomyopathy with ejection fraction 25-30% s/p AICD, peripheral artery disease, hyperlipidemia, and COPD presents with two-day history of worsening shortness of breath. The patient endorses compliance with his furosemide. In the past 48 hours, the patient states that he has gained 7 pounds. He is also noted increasing orthopnea associated with increasing leg edema and increasing abdominal girth. He denies any fevers, chills, nausea, vomiting, diarrhea, dizziness, syncope. He denies any abdominal pain or dysuria. He does complain of increasing shortness of breath particularly with exertion. He also complaints of chest tightness for the past 24-48 hours.the patient was recently admitted to the hospital from 01/25/2012 to 02/02/2012 for acute epididymitis, encephalopathy, and CHF exacerbation.  Patient endorses compliance with all his meds including lasix 20mg  tid. In the emergency department, the patient received 40 mg of intravenous Lasix with improvement of his shortness of breath. Chest x-ray was found to have pulmonary vascular congestion. ProBNP was 11,748. CBC was essentially unremarkable. BUN 26, creatinine 1.47.  Assessment/Plan: Acute on chronic systolic and diastolic CHF -07/24/2009 EF 25-30% -cardiologist is Dr. Allyson Sabal -Continue furosemide 40 mg IV every 12 hours -Continue Bi-Dil -Continue Bystolic -Daily weights, fluid restriction -Echocardiogram -Check TSH Chest discomfort -EKG -Cycle troponins CKD stage III -Patient's creatinine has varied widely over the past 6 months, but appears at baseline creatinine is 1.5-1.8 Diabetes mellitus type 2 -Hemoglobin A1c -NovoLog sliding scale -Hold glipizide COPD -Aerosolized albuterol and Atrovent when necessary CODE  STATUS -Verified with the patient's wife-->DNR -patient currently receives home hospice care       Past Medical History  Diagnosis Date  . IHD (ischemic heart disease)      -  non-ST elevation MI in July 2009. and NSTEMI Jan 2010 in setting of RLL CAP. 99% diagonal  disease s/p stent July of 2009. Cath Jan 2010 showed  occulusion of SVG to RCA   LVEF 45%. rec med rx   - Echo 10/29/08 EF 25%  . PAD (peripheral artery disease)   . Hyperlipidemia   . Hypertension   . Obstructive sleep apnea      Refuses to wear his CPAP.  Not on oxygen   . Vocal cord dysfunction     with Botox injection at [facility] around 2005  per patient.     - Patient's swallowing difficulties may be related to vocal    cord dysfunction, recommended to be followed up at [facility]  . Stroke     History of left brain cerebrovascular accident in 1998 without   . Dysphagia       -swallowing study nl oropharyngeal swallow, brief pause  at top of the esophagus which clears with liquid wash or small bites.    Rec  regular diet with thin liquids and continue management of    esophageal dysmotility.   - Barium swallow 10/31/08  mild dilation upper esosphagus, moderate slowing, can't r/o mass  . Anemia         03/21/2008 hgb 10.9, MCV 86, Stool occult blood postiive -> On IV Iron   . COPD (chronic obstructive pulmonary disease)     - Hypoxemia, former smoker-  FEV1 1.86 (72%) , ratio 68% (07/26/08) - FEV1 1.61 (62%) , ratio 74    ( 10/29/08) with no insp or  exp truncation on f/v loop   Past Surgical History  Procedure Laterality Date  . Coronary artery bypass graft    . Abdominal aortic aneurysm repair    . Aortobifemoral bypass graft in the past    . Icd insertion      ICD [employer]   Social History:  reports that he quit smoking about 32 years ago. His smoking use included Cigarettes. He has a 7.5 pack-year smoking history. His smokeless tobacco use includes Chew. He reports that  drinks alcohol. He  reports that he does not use illicit drugs.   Family History  Problem Relation Age of Onset  . COPD Sister   . Heart disease Sister   . Alzheimer's disease Sister   . Lung cancer Sister   . Heart disease Brother   . Alzheimer's disease Brother   . Alzheimer's disease Mother      Allergies  Allergen Reactions  . Codeine     REACTION: "makes me crazy"  . Inderal (Propranolol) Other (See Comments)    unknown  . Sulfamethoxazole W-Trimethoprim       Prior to Admission medications   Medication Sig Start Date End Date Taking? Authorizing Provider  albuterol (PROVENTIL) (2.5 MG/3ML) 0.083% nebulizer solution Inhale 1 unit dose vial nebulizer  4  times a day as needed for shortness of breathe. 05/15/12  Yes Kimber Relic, MD  allopurinol (ZYLOPRIM) 300 MG tablet Take 300 mg by mouth every evening.    Yes Historical Provider, MD  ALPRAZolam Prudy Feeler) 1 MG tablet Take 1 tablet (1 mg total) by mouth at bedtime. 02/02/12  Yes Dorothea Ogle, MD  aspirin 81 MG tablet Take 81 mg by mouth every morning.    Yes Historical Provider, MD  benzonatate (TESSALON) 200 MG capsule Take 200 mg by mouth 2 (two) times daily. For cough   Yes Historical Provider, MD  cetirizine (ZYRTEC) 10 MG chewable tablet Chew 10 mg by mouth every morning.    Yes Historical Provider, MD  cholecalciferol (VITAMIN D) 1000 UNITS tablet Take 2,000 Units by mouth every evening.   Yes Historical Provider, MD  diphenhydrAMINE (BENADRYL) 25 MG tablet Take 50 mg by mouth at bedtime.   Yes Historical Provider, MD  furosemide (LASIX) 20 MG tablet Take 20 mg by mouth 3 (three) times daily.   Yes Historical Provider, MD  glipiZIDE (GLUCOTROL) 5 MG tablet Take 5 mg by mouth every morning.    Yes Historical Provider, MD  guaiFENesin (MUCINEX) 600 MG 12 hr tablet Take 600-1,200 mg by mouth every 12 (twelve) hours as needed. For cough   Yes Historical Provider, MD  iron polysaccharides (NIFEREX) 150 MG capsule Take 150 mg by mouth every  morning.    Yes Historical Provider, MD  isosorbide-hydrALAZINE (BIDIL) 20-37.5 MG per tablet Take 1 tablet by mouth 3 (three) times daily.    Yes Historical Provider, MD  levalbuterol [facility] HFA) 45 MCG/ACT inhaler Inhale 1-2 puffs into the lungs every 4 (four) hours as needed. For shortness of breath   Yes Historical Provider, MD  morphine (ROXANOL) 20 MG/ML concentrated solution Take 5 mg by mouth every 2 (two) hours as needed. For pain 02/02/12  Yes Dorothea Ogle, MD  nebivolol (BYSTOLIC) 5 MG tablet Take 2.5 mg by mouth every morning. Take 1/2 (half) tab qd   Yes Historical Provider, MD  omeprazole (PRILOSEC) 20 MG capsule Take 20-40 mg by mouth 2 (two) times daily. Take 2 capsules in the morning and 1 capsule  in the evenings   Yes Historical Provider, MD  oxybutynin (DITROPAN) 5 MG tablet Take 1 tablet (5 mg total) by mouth every 8 (eight) hours as needed. 02/02/12  Yes Dorothea Ogle, MD  oxyCODONE 10 MG TABS Take 1 tablet (10 mg total) by mouth 3 (three) times daily. 02/02/12  Yes Dorothea Ogle, MD  pravastatin (PRAVACHOL) 40 MG tablet Take 40 mg by mouth every evening.    Yes Historical Provider, MD  ranolazine (RANEXA) 500 MG 12 hr tablet Take 1 tablet (500 mg total) by mouth every morning. 05/12/12  Yes Man X Mast, NP  senna-docusate (SENOKOT-S) 8.6-50 MG per tablet Take 3 tablets by mouth 2 (two) times daily.   Yes Historical Provider, MD  sertraline (ZOLOFT) 100 MG tablet Take 100 mg by mouth every morning.    Yes Historical Provider, MD  nitroGLYCERIN (NITROSTAT) 0.4 MG SL tablet Place 0.4 mg under the tongue every 5 (five) minutes x 3 doses as needed. For chest pain    Historical Provider, MD    Review of Systems:  Constitutional:  No weight loss, night sweats, Fevers, chills, fatigue.  Head&Eyes: No headache.  No vision loss.  No eye pain or scotoma ENT:  No Difficulty swallowing,Tooth/dental problems,Sore throat,  No ear ache, post nasal drip,  Cardio-vascular:  No dizziness,  palpitations  GI:  No  abdominal pain, nausea, vomiting, diarrhea, loss of appetite, hematochezia, melena, heartburn, indigestion, Resp:   No cough. No coughing up of blood .No wheezing.No chest wall deformity  Skin:  no rash or lesions.  GU:  no dysuria, change in color of urine, no urgency or frequency. No flank pain.  Musculoskeletal:   No decreased range of motion. No back pain.  Psych:  No change in mood or affect. No depression or anxiety. Neurologic: No headache, no dysesthesia, no focal weakness, no vision loss. No syncope  Physical Exam: Filed Vitals:   06/01/12 2300 06/01/12 2315 06/01/12 2330 06/01/12 2345  BP: 142/66 132/59 139/73 122/65  Pulse: 63 71 83 77  Temp:      TempSrc:      Resp: 18 17 19 18   SpO2: 97% 96% 92% 93%   General:  A&O x 2, NAD, nontoxic, pleasant/cooperative Head/Eye: No conjunctival hemorrhage, no icterus, [place]/AT, No nystagmus ENT:  No icterus,  No thrush, good dentition, no pharyngeal exudate Neck:  No masses, no lymphadenpathy, no bruits CV:  RRR, no rub, no gallop Lung:  Bibasilar crackles. No wheezes or rhonchi. Abdomen: soft/NT, +BS, nondistended, no peritoneal signs Ext: No cyanosis, No rashes, No petechiae, No lymphangitis, 1+ edema   Labs on Admission:  Basic Metabolic Panel:  Recent Labs Lab 06/01/12 2048  NA 142  K 3.8  CL 104  CO2 33*  GLUCOSE 83  BUN 26*  CREATININE 1.47*  CALCIUM 9.6   Liver Function Tests: No results found for this basename: AST, ALT, ALKPHOS, BILITOT, PROT, ALBUMIN,  in the last 168 hours No results found for this basename: LIPASE, AMYLASE,  in the last 168 hours No results found for this basename: AMMONIA,  in the last 168 hours CBC:  Recent Labs Lab 06/01/12 2048  WBC 6.1  HGB 11.2*  HCT 34.0*  MCV 91.9  PLT 151   Cardiac Enzymes: No results found for this basename: CKTOTAL, CKMB, CKMBINDEX, TROPONINI,  in the last 168 hours BNP: No components found with this basename: POCBNP,   CBG: No results found for this basename: GLUCAP,  in the last  168 hours  Radiological Exams on Admission: Dg Chest Port 1 View  06/01/2012  *RADIOLOGY REPORT*  Clinical Data: Chest pain and shortness of breath.  PORTABLE CHEST - 1 VIEW  Comparison: Two-view chest 01/27/2012.  Findings: The heart is enlarged.  There is mild pulmonary vascular congestion.  No focal airspace consolidation is evident.  The visualized soft tissues and bony thorax are unremarkable.  IMPRESSION:  1.  Stable cardiomegaly. 2.  Mild pulmonary vascular congestion.   Original Report Authenticated By: Marin Roberts, M.D.     EKG: Independently reviewed. pending    Time spent:70 minutes Code Status:   DNR Family Communication:   Family at bedside   Tashan Kreitzer, DO  [facility] Pager [phone]  If 7PM-7AM, please contact night-coverage [email or web address] Password [facility] 06/02/2012, 12:19 AM

## 2012-06-02 NOTE — Progress Notes (Signed)
06/02/12 0120 Patient arrived to unit by stretcher with son at bedside. Patient arrived on oxygen 3 L [place]. IV saline locked. Alert and oriented x 4. He showed no signs of distress. Was assisted by staff with transport from stretcher to bed. Pt.couldn't stand because of generalized weakness.

## 2012-06-02 NOTE — Progress Notes (Signed)
Utilization Review Completed Emira Eubanks J. Brennyn Ortlieb, RN, BSN, NCM [phone]  

## 2012-06-02 NOTE — Consult Note (Signed)
Reason for Consult: CHF Referring Physician: TRH     HPI: The patient is an 79 y/o male, followed by Dr. Allyson Sabal. He has a history of CAD, s/p CABG in 1995 and redo in 2004. He has an ischemic cardiomyopathy with an EF of 25-30%, s/p AICD by Dr. Ladona Ridgel. His history is also significant for HTN, insulin dependent DM, HLD, PAD, COPD, pulmonary HTN, severe MR and bilateral ICA stenosis. He presented to [facility] on 06/01/12 with complaints of worsening shortness of breath, over the course of two days, accompanied by orthopnea, ~7lb weight gain, LEE, increased abdominal girth and chest tightness. He reports both medical and dietary compliance. He endorses chills and non-productive cough, but no fever, n/v. In the ED, he was found to have an elevated BNP of 11K. CXR showed mild pulmonary vascular congestion.  He was given 40 mg IV lasix. He was admitted by [facility]. He is now on BID dosing of IV Lasix. He reports mild improvement in breathing but continues to endorse substernal chest tightness. Troponin negative x 1.  No EKG thus far.     Past Medical History  Diagnosis Date  . IHD (ischemic heart disease)      -  non-ST elevation MI in July 2009. and NSTEMI Jan 2010 in setting of RLL CAP. 99% diagonal  disease s/p stent July of 2009. Cath Jan 2010 showed  occulusion of SVG to RCA   LVEF 45%. rec med rx   - Echo 10/29/08 EF 25%  . PAD (peripheral artery disease)   . Hyperlipidemia   . Hypertension   . Obstructive sleep apnea      Refuses to wear his CPAP.  Not on oxygen   . Vocal cord dysfunction     with Botox injection at [facility] around 2005  per patient.     - Patient's swallowing difficulties may be related to vocal    cord dysfunction, recommended to be [facility] up at [facility]  . Stroke     History of left brain cerebrovascular accident in 1998 without   . Dysphagia       -swallowing study nl oropharyngeal swallow, brief pause  at top of the esophagus which clears with liquid  wash or small bites.    Rec  regular diet with thin liquids and continue management of    esophageal dysmotility.   - Barium swallow 10/31/08  mild dilation upper esosphagus, moderate slowing, can't r/o mass  . Anemia         03/21/2008 hgb 10.9, MCV 86, Stool occult blood postiive -> On IV Iron   . COPD (chronic obstructive pulmonary disease)     - Hypoxemia, former smoker-  FEV1 1.86 (72%) , ratio 68% (07/26/08) - FEV1 1.61 (62%) , ratio 74    ( 10/29/08) with no insp or exp truncation on f/v loop    Past Surgical History  Procedure Laterality Date  . Coronary artery bypass graft    . Abdominal aortic aneurysm repair    . Aortobifemoral bypass graft in the past    . Icd insertion      ICD [employer]    Family History  Problem Relation Age of Onset  . COPD Sister   . Heart disease Sister   . Alzheimer's disease Sister   . Lung cancer Sister   . Heart disease Brother   . Alzheimer's disease Brother   . Alzheimer's disease Mother     Social History:  reports  that he quit smoking about 32 years ago. His smoking use included Cigarettes. He has a 7.5 pack-year smoking history. His smokeless tobacco use includes Chew. He reports that  drinks alcohol. He reports that he does not use illicit drugs.  Allergies:  Allergies  Allergen Reactions  . Codeine     REACTION: "makes me crazy"  . Inderal (Propranolol) Other (See Comments)    unknown  . Sulfamethoxazole W-Trimethoprim     Medications:  Prior to Admission:  Prescriptions prior to admission  Medication Sig Dispense Refill  . albuterol (PROVENTIL) (2.5 MG/3ML) 0.083% nebulizer solution Inhale 1 unit dose vial nebulizer  4  times a day as needed for shortness of breathe.  30 mL  3  . allopurinol (ZYLOPRIM) 300 MG tablet Take 300 mg by mouth every evening.       Marland Kitchen ALPRAZolam (XANAX) 1 MG tablet Take 1 tablet (1 mg total) by mouth at bedtime.  45 tablet  0  . aspirin 81 MG tablet Take 81 mg by mouth every morning.       . benzonatate  (TESSALON) 200 MG capsule Take 200 mg by mouth 2 (two) times daily. For cough      . cetirizine (ZYRTEC) 10 MG chewable tablet Chew 10 mg by mouth every morning.       . cholecalciferol (VITAMIN D) 1000 UNITS tablet Take 2,000 Units by mouth every evening.      . diphenhydrAMINE (BENADRYL) 25 MG tablet Take 50 mg by mouth at bedtime.      . furosemide (LASIX) 20 MG tablet Take 20 mg by mouth 3 (three) times daily.      Marland Kitchen glipiZIDE (GLUCOTROL) 5 MG tablet Take 5 mg by mouth every morning.       Marland Kitchen guaiFENesin (MUCINEX) 600 MG 12 hr tablet Take 600-1,200 mg by mouth every 12 (twelve) hours as needed. For cough      . iron polysaccharides (NIFEREX) 150 MG capsule Take 150 mg by mouth every morning.       . isosorbide-hydrALAZINE (BIDIL) 20-37.5 MG per tablet Take 1 tablet by mouth 3 (three) times daily.       Marland Kitchen levalbuterol (XOPENEX HFA) 45 MCG/ACT inhaler Inhale 1-2 puffs into the lungs every 4 (four) hours as needed. For shortness of breath      . morphine (ROXANOL) 20 MG/ML concentrated solution Take 5 mg by mouth every 2 (two) hours as needed. For pain      . nebivolol (BYSTOLIC) 5 MG tablet Take 2.5 mg by mouth every morning. Take 1/2 (half) tab qd      . omeprazole (PRILOSEC) 20 MG capsule Take 20-40 mg by mouth 2 (two) times daily. Take 2 capsules in the morning and 1 capsule in the evenings      . oxybutynin (DITROPAN) 5 MG tablet Take 1 tablet (5 mg total) by mouth every 8 (eight) hours as needed.  45 tablet  0  . oxyCODONE 10 MG TABS Take 1 tablet (10 mg total) by mouth 3 (three) times daily.  45 tablet  0  . pravastatin (PRAVACHOL) 40 MG tablet Take 40 mg by mouth every evening.       . ranolazine (RANEXA) 500 MG 12 hr tablet Take 1 tablet (500 mg total) by mouth every morning.  30 tablet  5  . senna-docusate (SENOKOT-S) 8.6-50 MG per tablet Take 3 tablets by mouth 2 (two) times daily.      . sertraline (ZOLOFT) 100 MG tablet Take  100 mg by mouth every morning.       . nitroGLYCERIN  (NITROSTAT) 0.4 MG SL tablet Place 0.4 mg under the tongue every 5 (five) minutes x 3 doses as needed. For chest pain        Results for orders placed during the hospital encounter of 06/01/12 (from the past 48 hour(s))  CBC     Status: Abnormal   Collection Time    06/01/12  8:48 PM      Result Value Range   WBC 6.1  4.0 - 10.5 K/uL   RBC 3.70 (*) 4.22 - 5.81 MIL/uL   Hemoglobin 11.2 (*) 13.0 - 17.0 g/dL   HCT 29.5 (*) 28.4 - 13.2 %   MCV 91.9  78.0 - 100.0 fL   MCH 30.3  26.0 - 34.0 pg   MCHC 32.9  30.0 - 36.0 g/dL   RDW 44.0 (*) 10.2 - 72.5 %   Platelets 151  150 - 400 K/uL  BASIC METABOLIC PANEL     Status: Abnormal   Collection Time    06/01/12  8:48 PM      Result Value Range   Sodium 142  135 - 145 mEq/L   Potassium 3.8  3.5 - 5.1 mEq/L   Chloride 104  96 - 112 mEq/L   CO2 33 (*) 19 - 32 mEq/L   Glucose, Bld 83  70 - 99 mg/dL   BUN 26 (*) 6 - 23 mg/dL   Creatinine, Ser 3.66 (*) 0.50 - 1.35 mg/dL   Calcium 9.6  8.4 - 44.0 mg/dL   GFR calc non Af Amer 42 (*) >90 mL/min   GFR calc Af Amer 49 (*) >90 mL/min   Comment:            The eGFR has been calculated     using the CKD EPI equation.     This calculation has not been     validated in all clinical     situations.     eGFR's persistently     <90 mL/min signify     possible Chronic Kidney Disease.  PRO B NATRIURETIC PEPTIDE     Status: Abnormal   Collection Time    06/01/12  8:48 PM      Result Value Range   Pro B Natriuretic peptide (BNP) 11748.0 (*) 0 - 450 pg/mL  POCT I-STAT TROPONIN I     Status: None   Collection Time    06/01/12  9:02 PM      Result Value Range   Troponin i, poc 0.04  0.00 - 0.08 ng/mL   Comment 3            Comment: Due to the release kinetics of cTnI,     a negative result within the first hours     of the onset of symptoms does not rule out     myocardial infarction with certainty.     If myocardial infarction is still suspected,     repeat the test at appropriate intervals.   TROPONIN I     Status: None   Collection Time    06/02/12  1:37 AM      Result Value Range   Troponin I <0.30  <0.30 ng/mL   Comment:            Due to the release kinetics of cTnI,     a negative result within the first hours     of the onset of symptoms  does not rule out     myocardial infarction with certainty.     If myocardial infarction is still suspected,     repeat the test at appropriate intervals.  GLUCOSE, CAPILLARY     Status: None   Collection Time    06/02/12  1:41 AM      Result Value Range   Glucose-Capillary 94  70 - 99 mg/dL  URINALYSIS, MICROSCOPIC ONLY     Status: Abnormal   Collection Time    06/02/12  1:50 AM      Result Value Range   Color, Urine YELLOW  YELLOW   APPearance CLEAR  CLEAR   Specific Gravity, Urine 1.011  1.005 - 1.030   pH 5.0  5.0 - 8.0   Glucose, UA NEGATIVE  NEGATIVE mg/dL   Hgb urine dipstick NEGATIVE  NEGATIVE   Bilirubin Urine NEGATIVE  NEGATIVE   Ketones, ur NEGATIVE  NEGATIVE mg/dL   Protein, ur NEGATIVE  NEGATIVE mg/dL   Urobilinogen, UA 0.2  0.0 - 1.0 mg/dL   Nitrite NEGATIVE  NEGATIVE   Leukocytes, UA NEGATIVE  NEGATIVE   WBC, UA 0-2  <3 WBC/hpf   RBC / HPF 0-2  <3 RBC/hpf   Casts HYALINE CASTS (*) NEGATIVE  BASIC METABOLIC PANEL     Status: Abnormal   Collection Time    06/02/12  4:20 AM      Result Value Range   Sodium 141  135 - 145 mEq/L   Potassium 3.3 (*) 3.5 - 5.1 mEq/L   Chloride 104  96 - 112 mEq/L   CO2 31  19 - 32 mEq/L   Glucose, Bld 95  70 - 99 mg/dL   BUN 26 (*) 6 - 23 mg/dL   Creatinine, Ser 1.61  0.50 - 1.35 mg/dL   Calcium 9.0  8.4 - 09.6 mg/dL   GFR calc non Af Amer 47 (*) >90 mL/min   GFR calc Af Amer 55 (*) >90 mL/min   Comment:            The eGFR has been calculated     using the CKD EPI equation.     This calculation has not been     validated in all clinical     situations.     eGFR's persistently     <90 mL/min signify     possible Chronic Kidney Disease.  GLUCOSE, CAPILLARY      Status: None   Collection Time    06/02/12  5:57 AM      Result Value Range   Glucose-Capillary 92  70 - 99 mg/dL   Comment 1 Documented in Chart     Comment 2 Notify RN      Dg Chest Port 1 View  06/01/2012  *RADIOLOGY REPORT*  Clinical Data: Chest pain and shortness of breath.  PORTABLE CHEST - 1 VIEW  Comparison: Two-view chest 01/27/2012.  Findings: The heart is enlarged.  There is mild pulmonary vascular congestion.  No focal airspace consolidation is evident.  The visualized soft tissues and bony thorax are unremarkable.  IMPRESSION:  1.  Stable cardiomegaly. 2.  Mild pulmonary vascular congestion.   Original Report Authenticated By: Marin Roberts, M.D.     Review of Systems  Constitutional: Positive for chills. Negative for fever and diaphoresis.  Respiratory: Positive for cough and shortness of breath. Negative for sputum production.   Cardiovascular: Positive for chest pain (tightness), orthopnea, leg swelling and PND. Negative for claudication.  Gastrointestinal: Positive for  abdominal pain. Negative for nausea, vomiting, diarrhea, constipation, blood in stool and melena.  Genitourinary: Negative for hematuria.  Neurological: Negative for dizziness and loss of consciousness.   Blood pressure 126/62, pulse 74, temperature 97.4 F (36.3 C), temperature source Oral, resp. rate 18, height 5\' 9"  (1.753 m), weight 170 lb 4.8 oz (77.248 kg), SpO2 93.00%. Physical Exam  Constitutional: He is oriented to person, place, and time. He appears well-developed and well-nourished. He appears distressed (shaking chills, no respiratory distress).  HENT:  Head: Normocephalic and atraumatic.  Eyes: Conjunctivae and EOM are normal. Pupils are equal, round, and reactive to light.  Neck: Normal range of motion. JVD present. Carotid bruit is present (bilateral). No thyromegaly present.  Cardiovascular: Normal rate, regular rhythm, normal heart sounds and intact distal pulses.  Exam reveals no  gallop and no friction rub.   No murmur heard. Pulses:      Radial pulses are 2+ on the right side, and 2+ on the left side.       Dorsalis pedis pulses are 2+ on the right side, and 2+ on the left side.  Respiratory: Effort normal. No respiratory distress. He has no wheezes. He has rales (mild bibasilar rales).  GI: Soft. Bowel sounds are normal. He exhibits distension (mild distension). He exhibits no mass. There is no tenderness.  Musculoskeletal: He exhibits edema (trace - 1+ lower extremity).  Lymphadenopathy:    He has no cervical adenopathy.  Neurological: He is alert and oriented to person, place, and time.  Skin: Skin is warm and dry. He is not diaphoretic.  Psychiatric: He has a normal mood and affect. His behavior is normal.    Assessment/Plan: Principal Problem:   Systolic and diastolic CHF, acute on chronic Active Problems:   CARDIOMYOPATHY, ISCHEMIC - EF < 35% , has AICD   Chronic systolic heart failure   COPD   CKD (chronic kidney disease) stage 3, GFR 30-59 ml/min   HLD (hyperlipidemia)   Diabetes   HTN (hypertension)   CAD - Hx of CABG in 1995 with redo in 2004  Plan: Agree with BID dosing with IV lasix. Continue for diuresis as long a renal function allows. SCr. today is stable at 1.34. Continue with strict I's/O's and daily weights. Will also need to monitor electrolytes. He is mildly hypokalemic today with K+ of 3.3. Will supplement with 40 mEq of KCl. Will recheck BNP in 1-2 days. Can also consider rechecking CXR in 1-2 days. Pt continues to endorse chest tightness. Troponin was negative x 1 yesterday. Will recheck again today. There have been no EKGs this hospitalization. Will order a 12-lead. CP may be subsequent to CHF exacerbation. He is on a good HF/CAD medical regimen with BB, hydralazine + long acting nitrate, ASA, Lasix, a statin and Ranexa. He is also on Lovenox for anticoagulation. Continue to treat CHF with diuretics for now. Will rule out ACS with serial  troponin's and EKGs. Per Dr. Ardyth Harps, telemetry suggested ?VTach. I have contacted the [employer] representative, Jana Half. She will interrogate ICD today and will place findings in pts chart. MD to follow with further recommendation.    Allayne Butcher PA-C 06/02/2012, 9:54 AM     Patient seen and examined. Agree with assessment and plan. Very pleasant 79 yo WM with h/o ISM, s/p initial CABG in 1995 and re-do 2005, s/p NSTEMI 2009 with syent placementto Diagonal vessel, and NSTEM 2010 with documented occluded SVG to RCA. He has an ICD followed by Dr. Ladona Ridgel. Admitted  now with several weeks of increasing dyspnea, PND, orthopnea  And evidence for CHF. BNP >11,000. No acute ECG changes. Will continue with IV diuresis, continue bidil but may be able to up-titrate. Will schedule for 2d-echo, and once more stable consider ischemic re-assessment with ? Cath vs risk stratification with nuclear imaging. Will follow.    Lennette Bihari, MD, [facility] 06/02/2012 11:57 AM

## 2012-06-02 NOTE — Progress Notes (Signed)
[name] Sw note: Sw met with son-in-law Dustin [name], Pt was resting during visit. Dustin [name] reported he felt like Pt was "getting more comfortable" since admission, per Dustin [name] "he still wakes up sometimes and it looks like he's uncomfortable." Plan per Dustin [name] [name] is for to return to [employer] (independent senior apt) with his wife at DC from hospital. Pt has been under [facility] care with Rn/Sw/chaplain/hospice aide support in the home. Pt is a DNR. Sw provided active/reflective listening and emotional support during visit. Will continue to follow for support during hospital stay.  [address] [place], [facility] [phone]

## 2012-06-02 NOTE — Care Management Note (Signed)
    Page 1 of 1   06/02/2012     3:10:36 PM   CARE MANAGEMENT NOTE 06/02/2012  Patient:  Dustin [name],Dustin [name]   Account Number:  [id number]  Date Initiated:  06/02/2012  Documentation initiated by:  Oletta Cohn  Subjective/Objective Assessment:   79 yo admitted with SOB/ PCP Juline Patch     Action/Plan:   Home with Spouse/ Home with Home Health   Anticipated DC Date:  06/05/2012   Anticipated DC Plan:  HOME W HOME HEALTH SERVICES      DC Planning Services  CM consult      Choice offered to / List presented to:             Status of service:   Medicare Important Message given?   (If response is "NO", the following Medicare IM given date fields will be blank) Date Medicare IM given:   Date Additional Medicare IM given:    Discharge Disposition:    Per UR Regulation:    If discussed at Long Length of Stay Meetings, dates discussed:    Comments:  06/02/12 1500.Marland KitchenMarland KitchenOletta Cohn, RN, BSN, [employer] [phone] Spoke with pt and wife regarding discharge planning.  Pt and wife decline HH services at this time as they have Home Hospice services.  CM to follow-up prior to d/c home.

## 2012-06-03 ENCOUNTER — Inpatient Hospital Stay (HOSPITAL_COMMUNITY)

## 2012-06-03 LAB — BASIC METABOLIC PANEL
BUN: 40 mg/dL — ABNORMAL HIGH (ref 6–23)
CO2: 32 mEq/L (ref 19–32)
Chloride: 101 mEq/L (ref 96–112)
Creatinine, Ser: 2 mg/dL — ABNORMAL HIGH (ref 0.50–1.35)

## 2012-06-03 LAB — GLUCOSE, CAPILLARY

## 2012-06-03 MED ORDER — LEVALBUTEROL HCL 0.63 MG/3ML IN NEBU
0.6300 mg | INHALATION_SOLUTION | Freq: Three times a day (TID) | RESPIRATORY_TRACT | Status: DC
  Administered 2012-06-03 – 2012-06-05 (×7): 0.63 mg via RESPIRATORY_TRACT
  Filled 2012-06-03 (×11): qty 3

## 2012-06-03 MED ORDER — ISOSORB DINITRATE-HYDRALAZINE 20-37.5 MG PO TABS
2.0000 | ORAL_TABLET | Freq: Two times a day (BID) | ORAL | Status: DC
  Administered 2012-06-03 (×2): 2 via ORAL
  Filled 2012-06-03 (×4): qty 2

## 2012-06-03 NOTE — Progress Notes (Signed)
[facility] Progress Note  Subjective:  Short of breath and coughing.  Objective:   Vital Signs in the last 24 hours: Temp:  [98.1 F (36.7 C)-98.6 F (37 C)] 98.1 F (36.7 C) (04/12 0612) Pulse Rate:  [60-69] 64 (04/12 0612) Resp:  [20] 20 (04/12 0612) BP: (95-109)/(43-58) 105/49 mmHg (04/12 0612) SpO2:  [95 %] 95 % (04/12 0612) Weight:  [77 kg (169 lb 12.1 oz)] 77 kg (169 lb 12.1 oz) (04/12 0612)  Intake/Output from previous day: 04/11 0701 - 04/12 0700 In: 454 [P.O.:454] Out: 650 [Urine:650]  Scheduled: . allopurinol  300 mg Oral QPM  . ALPRAZolam  1 mg Oral QHS  . aspirin EC  81 mg Oral Daily  . cholecalciferol  2,000 Units Oral QPM  . enoxaparin (LOVENOX) injection  30 mg Subcutaneous Daily  . furosemide  40 mg Intravenous BID  . insulin aspart  0-9 Units Subcutaneous TID WC  . iron polysaccharides  150 mg Oral q morning - 10a  . isosorbide-hydrALAZINE  1 tablet Oral TID  . nebivolol  2.5 mg Oral q morning - 10a  . nitroGLYCERIN  1 inch Topical Q6H  . oxyCODONE  10 mg Oral TID  . pantoprazole  40 mg Oral Daily  . ranolazine  500 mg Oral q morning - 10a  . senna-docusate  3 tablet Oral BID  . sertraline  100 mg Oral q morning - 10a  . simvastatin  20 mg Oral q1800  . sodium chloride  3 mL Intravenous Q12H  . zolpidem  5 mg Oral QHS    Physical Exam:   General appearance: alert, cooperative and coughing Neck: supple, symmetrical, trachea midline, thyroid not enlarged, symmetric, no tenderness/mass/nodules and JVD ~8 cm Lungs: wheezes RLL Heart: regular rate and rhythm and 2/6 sem Abdomen: soft, non-tender; bowel sounds normal; no masses,  no organomegaly Extremities: 1+ pitting edema   Rate: 60  Rhythm:paced  Lab Results:    Recent Labs  06/02/12 0420 06/03/12 0600  NA 141 138  K 3.3* 4.3  CL 104 101  CO2 31 32  GLUCOSE 95 138*  BUN 26* 40*  CREATININE 1.34 2.00*   BNP (last 3 results)  Recent Labs   06/01/12 2048  PROBNP 11748.0*    Recent Labs  06/02/12 0137 06/02/12 0946  TROPONINI <0.30 <0.30   Hepatic Function Panel No results found for this basename: PROT, ALBUMIN, AST, ALT, ALKPHOS, BILITOT, BILIDIR, IBILI,  in the last 72 hours No results found for this basename: INR,  in the last 72 hours  Lipid Panel     Component Value Date/Time   CHOL  Value: 208        ATP III CLASSIFICATION:  <200     mg/dL   Desirable  284-132  mg/dL   Borderline High  >=440    mg/dL   High       * 1/0/2725 0136   TRIG 127 10/25/2008 0136   HDL 43 10/25/2008 0136   CHOLHDL 4.8 10/25/2008 0136   VLDL 25 10/25/2008 0136   LDLCALC  Value: 140        Total Cholesterol/HDL:CHD Risk Coronary Heart Disease Risk Table                     Men   Women  1/2 Average Risk   3.4   3.3  Average Risk       5.0   4.4  2 X Average  Risk   9.6   7.1  3 X Average Risk  23.4   11.0        Use the calculated Patient Ratio above and the CHD Risk Table to determine the patient's CHD Risk.        ATP III CLASSIFICATION (LDL):  <100     mg/dL   Optimal  161-096  mg/dL   Near or Above                    Optimal  130-159  mg/dL   Borderline  045-409  mg/dL   High  >811     mg/dL   Very High* 10/23/4780 9562     Imaging:  Dg Chest Port 1 View  06/01/2012  *RADIOLOGY REPORT*  Clinical Data: Chest pain and shortness of breath.  PORTABLE CHEST - 1 VIEW  Comparison: Two-view chest 01/27/2012.  Findings: The heart is enlarged.  There is mild pulmonary vascular congestion.  No focal airspace consolidation is evident.  The visualized soft tissues and bony thorax are unremarkable.  IMPRESSION:  1.  Stable cardiomegaly. 2.  Mild pulmonary vascular congestion.   Original Report Authenticated By: Marin Roberts, M.D.    Study Conclusions  - Left ventricle: The cavity size was mildly dilated. Wall thickness was increased in a pattern of mild LVH. The estimated ejection fraction was in the range of 20% to 30%. Diffuse hypokinesis. There is  severe hypokinesis to akinesis of the inferior and inferoseptal myocardium. Features are consistent with a pseudonormal left ventricular filling pattern, with concomitant abnormal relaxation and increased filling pressure (grade 2 diastolic dysfunction). Doppler parameters are consistent with both elevated ventricular end-diastolic filling pressure and elevated left atrial filling pressure. - Aortic valve: Trileaflet; mildly thickened leaflets with calcification of noncoronary cusp. Sclerosis without stenosis. Mild regurgitation. - Mitral valve: Moderately calcified annulus. Mild to moderate regurgitation. - Left atrium: The atrium was mildly dilated. - Right ventricle: Systolic pressure was increased. - Atrial septum: No defect or patent foramen ovale was identified. - Pulmonary arteries: PA peak pressure: 66mm Hg (S). Impressions:  - The right ventricular systolic pressure was increased consistent with severe pulmonary hypertension.     Assessment/Plan:   Principal Problem:   Systolic and diastolic CHF, acute on chronic Active Problems:   CARDIOMYOPATHY, ISCHEMIC - EF < 35% , has AICD   Chronic systolic heart failure   COPD   CKD (chronic kidney disease) stage 3, GFR 30-59 ml/min   HLD (hyperlipidemia)   Diabetes   HTN (hypertension)   CAD - Hx of CABG in 1995 with redo in 2004   Wheezing on exam Will give xopenex inhaler. I/O yesterday -196; -731 since admission. Cr increased to 2.0 today. Will change bidil from 1 tid to 2 bid for now with titration as tolerates to 2 tid. EF 20 to <30%.   Lennette Bihari, MD, [facility] 06/03/2012, 8:45 AM

## 2012-06-03 NOTE — Progress Notes (Signed)
Related admission, acute CHF exacerbation.  DNR on chart.  Found patient receiving a breathing treatment for some noted wheezing this morning.  Patient continues to have periods of shortness of breath, I&O closely monitored and breathing treatments as appropriate.  Continue current plan of care and anticipate discharge home with continued Hospice services.  Encourage a call to Hospice at [phone] with any needs.  April Costella Hatcher, RN, BSN Hospice

## 2012-06-04 LAB — CBC WITH DIFFERENTIAL/PLATELET
Basophils Absolute: 0 10*3/uL (ref 0.0–0.1)
Basophils Relative: 0 % (ref 0–1)
HCT: 30.8 % — ABNORMAL LOW (ref 39.0–52.0)
MCHC: 32.8 g/dL (ref 30.0–36.0)
Monocytes Absolute: 0.6 10*3/uL (ref 0.1–1.0)
Neutro Abs: 4 10*3/uL (ref 1.7–7.7)
Neutrophils Relative %: 67 % (ref 43–77)
RDW: 15.7 % — ABNORMAL HIGH (ref 11.5–15.5)

## 2012-06-04 LAB — BASIC METABOLIC PANEL
BUN: 49 mg/dL — ABNORMAL HIGH (ref 6–23)
Calcium: 9.3 mg/dL (ref 8.4–10.5)
Creatinine, Ser: 1.83 mg/dL — ABNORMAL HIGH (ref 0.50–1.35)
GFR calc Af Amer: 38 mL/min — ABNORMAL LOW (ref >90)
GFR calc non Af Amer: 32 mL/min — ABNORMAL LOW (ref >90)

## 2012-06-04 LAB — GLUCOSE, CAPILLARY
Glucose-Capillary: 120 mg/dL — ABNORMAL HIGH (ref 70–99)
Glucose-Capillary: 184 mg/dL — ABNORMAL HIGH (ref 70–99)
Glucose-Capillary: 155 mg/dL — ABNORMAL HIGH (ref 70–99)

## 2012-06-04 MED ORDER — ISOSORB DINITRATE-HYDRALAZINE 20-37.5 MG PO TABS
2.0000 | ORAL_TABLET | Freq: Three times a day (TID) | ORAL | Status: DC
  Administered 2012-06-05 – 2012-06-12 (×22): 2 via ORAL
  Filled 2012-06-04 (×24): qty 2

## 2012-06-04 NOTE — Progress Notes (Signed)
Related admission, CHF exacerbation.  DNR on chart.  Found patient resting, no family present.  FLACC score 0.  Reviewed chart and patient has reported no chest pain and less shortness of breath.  Continue current plan of care and anticipate discharge home with continued Hospice services.  Encourage a call to Hospice at [phone] with any needs.  April Costella Hatcher, RN, BSN Hospice

## 2012-06-04 NOTE — Progress Notes (Signed)
[facility] Progress Note  Subjective:  No chest pain, breathing better, less shortness of breath.  Objective:   Vital Signs in the last 24 hours: Temp:  [98.1 F (36.7 C)-98.7 F (37.1 C)] 98.1 F (36.7 C) (04/13 0516) Pulse Rate:  [56-61] 61 (04/13 0516) Resp:  [20] 20 (04/13 0516) BP: (105-115)/(46-59) 105/46 mmHg (04/13 0516) SpO2:  [91 %-97 %] 95 % (04/13 0723) Weight:  [77.157 kg (170 lb 1.6 oz)] 77.157 kg (170 lb 1.6 oz) (04/13 0516)  Intake/Output from previous day: 04/12 0701 - 04/13 0700 In: 1446 [P.O.:1440; I.V.:6] Out: 1925 [Urine:1925]  Scheduled: . allopurinol  300 mg Oral QPM  . ALPRAZolam  1 mg Oral QHS  . aspirin EC  81 mg Oral Daily  . cholecalciferol  2,000 Units Oral QPM  . enoxaparin (LOVENOX) injection  30 mg Subcutaneous Daily  . furosemide  40 mg Intravenous BID  . insulin aspart  0-9 Units Subcutaneous TID WC  . iron polysaccharides  150 mg Oral q morning - 10a  . isosorbide-hydrALAZINE  2 tablet Oral BID  . levalbuterol  0.63 mg Nebulization TID  . nebivolol  2.5 mg Oral q morning - 10a  . nitroGLYCERIN  1 inch Topical Q6H  . oxyCODONE  10 mg Oral TID  . pantoprazole  40 mg Oral Daily  . ranolazine  500 mg Oral q morning - 10a  . senna-docusate  3 tablet Oral BID  . sertraline  100 mg Oral q morning - 10a  . simvastatin  20 mg Oral q1800  . sodium chloride  3 mL Intravenous Q12H  . zolpidem  5 mg Oral QHS     Physical Exam:   General appearance: alert, cooperative and no distress Neck: no JVD, supple, symmetrical, trachea midline and thyroid not enlarged, symmetric, no tenderness/mass/nodules Lungs: no wheezing now, decreased BS at bases Heart: regular rate and rhythm, 1/6 sem Abdomen: soft, non-tender; bowel sounds normal; no masses,  no organomegaly Extremities: 1+ edema, better   Rate: 75  Rhythm: paced  Lab Results:    Recent Labs  06/03/12 0600 06/04/12 0550  NA 138 139  K 4.3 3.9  CL 101  99  CO2 32 29  GLUCOSE 138* 136*  BUN 40* 49*  CREATININE 2.00* 1.83*   BNP (last 3 results)  Recent Labs  06/01/12 2048 06/04/12 0550  PROBNP 11748.0* 10975.0*    Recent Labs  06/02/12 0137 06/02/12 0946  TROPONINI <0.30 <0.30   Hepatic Function Panel No results found for this basename: PROT, ALBUMIN, AST, ALT, ALKPHOS, BILITOT, BILIDIR, IBILI,  in the last 72 hours No results found for this basename: INR,  in the last 72 hours  Lipid Panel     Component Value Date/Time   CHOL  Value: 208        ATP III CLASSIFICATION:  <200     mg/dL   Desirable  102-725  mg/dL   Borderline High  >=366    mg/dL   High       * 05/26/345 0136   TRIG 127 10/25/2008 0136   HDL 43 10/25/2008 0136   CHOLHDL 4.8 10/25/2008 0136   VLDL 25 10/25/2008 0136   LDLCALC  Value: 140        Total Cholesterol/HDL:CHD Risk Coronary Heart Disease Risk Table                     Men   Women  1/2 Average Risk  3.4   3.3  Average Risk       5.0   4.4  2 X Average Risk   9.6   7.1  3 X Average Risk  23.4   11.0        Use the calculated Patient Ratio above and the CHD Risk Table to determine the patient's CHD Risk.        ATP III CLASSIFICATION (LDL):  <100     mg/dL   Optimal  161-096  mg/dL   Near or Above                    Optimal  130-159  mg/dL   Borderline  045-409  mg/dL   High  >811     mg/dL   Very High* 10/23/4780 9562     Imaging:  Dg Chest 2 View  06/03/2012  *RADIOLOGY REPORT*  Clinical Data: Shortness of breath.  Former smoker.  Current history of diabetes.  Prior history of CHF and leukemia.  Prior CABG.  CHEST - 2 VIEW  Comparison: Portable chest x-ray 06/01/2012.  Two-view chest x-ray 01/27/2012, 01/01/2009.  Findings: Prior sternotomy for CABG.  Cardiac silhouette enlarged but stable.  Indwelling biventricular pacing defibrillator unchanged and intact.  Pulmonary venous hypertension without overt edema, unchanged since examination 2 days ago.  Small bilateral pleural effusions, unchanged.  No new pulmonary  parenchymal abnormalities.  Degenerative changes involving the thoracic spine.  IMPRESSION: Stable small bilateral pleural effusions and pulmonary venous hypertension without overt edema since the examination 2 days ago. No new abnormalities.   Original Report Authenticated By: Hulan Saas, M.D.       Assessment/Plan:   Principal Problem:   Systolic and diastolic CHF, acute on chronic Active Problems:   CARDIOMYOPATHY, ISCHEMIC - EF < 35% , has AICD   Chronic systolic heart failure   COPD   CKD (chronic kidney disease) stage 3, GFR 30-59 ml/min   HLD (hyperlipidemia)   Diabetes   HTN (hypertension)   CAD - Hx of CABG in 1995 with redo in 2004  I/O -479 yesterday. More comfortable today. Breathing better. Cr slightly improved. Tomorrow will titrate bidil to 2 tabs tid.    Lennette Bihari, MD, [facility] 06/04/2012, 8:50 AM

## 2012-06-05 DIAGNOSIS — I739 Peripheral vascular disease, unspecified: Secondary | ICD-10-CM | POA: Insufficient documentation

## 2012-06-05 DIAGNOSIS — Z95810 Presence of automatic (implantable) cardiac defibrillator: Secondary | ICD-10-CM | POA: Diagnosis present

## 2012-06-05 LAB — BASIC METABOLIC PANEL
BUN: 54 mg/dL — ABNORMAL HIGH (ref 6–23)
Calcium: 9.3 mg/dL (ref 8.4–10.5)
Creatinine, Ser: 1.77 mg/dL — ABNORMAL HIGH (ref 0.50–1.35)
GFR calc Af Amer: 39 mL/min — ABNORMAL LOW (ref >90)
GFR calc non Af Amer: 34 mL/min — ABNORMAL LOW (ref >90)
Glucose, Bld: 114 mg/dL — ABNORMAL HIGH (ref 70–99)

## 2012-06-05 LAB — GLUCOSE, CAPILLARY
Glucose-Capillary: 121 mg/dL — ABNORMAL HIGH (ref 70–99)
Glucose-Capillary: 132 mg/dL — ABNORMAL HIGH (ref 70–99)

## 2012-06-05 MED ORDER — LEVALBUTEROL HCL 0.63 MG/3ML IN NEBU
0.6300 mg | INHALATION_SOLUTION | Freq: Two times a day (BID) | RESPIRATORY_TRACT | Status: DC
  Administered 2012-06-05 – 2012-06-10 (×10): 0.63 mg via RESPIRATORY_TRACT
  Filled 2012-06-05 (×16): qty 3

## 2012-06-05 NOTE — Progress Notes (Signed)
Room 4706 - Dustin [name] - [facility] RN Visit-R.Corrie Reder RN  Related admission to [facility] diagnosis of CHF.  Pt is DNR code.    Pt alert & oriented, lying upright in bed, with complaints of RLE pain.    No family present.  Patient's home medication list is on shadow chart.   Pt very conversational, feels some better, though tired.  PT here to work with patient.   Please call [facility] @ [phone]- ask for RN Liaison or after hours,ask for on-call RN with any hospice needs.   Thank you.  [name], RN  [facility]  Hospice Liaison

## 2012-06-05 NOTE — Progress Notes (Signed)
Subjective:  Better but not at baseline.  Objective:  Vital Signs in the last 24 hours: Temp:  [97.3 F (36.3 C)-98.2 F (36.8 C)] 98.2 F (36.8 C) (04/14 0457) Pulse Rate:  [58-71] 71 (04/14 0457) Resp:  [16-20] 16 (04/14 0457) BP: (111-128)/(44-58) 128/58 mmHg (04/14 0457) SpO2:  [93 %-99 %] 93 % (04/14 0457) Weight:  [77.202 kg (170 lb 3.2 oz)] 77.202 kg (170 lb 3.2 oz) (04/14 0457)  Intake/Output from previous day:  Intake/Output Summary (Last 24 hours) at 06/05/12 0836 Last data filed at 06/04/12 2233  Gross per 24 hour  Intake    440 ml  Output   1440 ml  Net  -1000 ml   . allopurinol  300 mg Oral QPM  . ALPRAZolam  1 mg Oral QHS  . aspirin EC  81 mg Oral Daily  . cholecalciferol  2,000 Units Oral QPM  . enoxaparin (LOVENOX) injection  30 mg Subcutaneous Daily  . furosemide  40 mg Intravenous BID  . insulin aspart  0-9 Units Subcutaneous TID WC  . iron polysaccharides  150 mg Oral q morning - 10a  . isosorbide-hydrALAZINE  2 tablet Oral TID  . levalbuterol  0.63 mg Nebulization BID  . nebivolol  2.5 mg Oral q morning - 10a  . oxyCODONE  10 mg Oral TID  . pantoprazole  40 mg Oral Daily  . ranolazine  500 mg Oral q morning - 10a  . senna-docusate  3 tablet Oral BID  . sertraline  100 mg Oral q morning - 10a  . simvastatin  20 mg Oral q1800  . sodium chloride  3 mL Intravenous Q12H  . zolpidem  5 mg Oral QHS   Physical Exam: General appearance: alert, cooperative and no distress Lungs: Velcro crackles 1/2 up bilat Heart: regular rate and rhythm BS+, abd soft Less edema LE   Rate: 72  Rhythm: Paced  Lab Results:  Recent Labs  06/04/12 0550  WBC 5.9  HGB 10.1*  PLT 137*    Recent Labs  06/04/12 0550 06/05/12 0545  NA 139 139  K 3.9 3.9  CL 99 99  CO2 29 32  GLUCOSE 136* 114*  BUN 49* 54*  CREATININE 1.83* 1.77*    Recent Labs  06/02/12 0946  TROPONINI <0.30   Hepatic Function Panel No results found for this basename: PROT, ALBUMIN,  AST, ALT, ALKPHOS, BILITOT, BILIDIR, IBILI,  in the last 72 hours No results found for this basename: CHOL,  in the last 72 hours No results found for this basename: INR,  in the last 72 hours  Imaging: Imaging results have been reviewed  Cardiac Studies:  Assessment/Plan:   Principal Problem:   Systolic and diastolic CHF, acute on chronic Active Problems:   CARDIOMYOPATHY, ISCHEMIC - EF 20-30% 2D 06/02/12   Chronic systolic heart failure   CKD (chronic kidney disease) stage 3, GFR 30-59 ml/min   CAD - Hx of CABG in 1995 with redo in 2004   COPD   HLD (hyperlipidemia)   Diabetes   HTN (hypertension)   ICD (implantable cardiac defibrillator) in place   PVD (peripheral vascular disease)- bilat ICA disease   Plan- He really hasn't diuresed much, his wgt is about the same. Will discuss with MD, ? Milrinone   Corine Shelter PA-C 06/05/2012, 8:36 AM   Patient seen and examined. I/O -760 yesterday and so far - 500 today; total -2155 net output since admission.  Tolerating titration of bidil now to 2 tabs  tid. Breathing better. Cr continues to slightly improve. If renal function further improves, consider very low dose aldosterone blockade.   Lennette Bihari, MD, [facility] 06/05/2012 10:42 AM

## 2012-06-05 NOTE — Plan of Care (Signed)
Problem: Phase II Progression Outcomes Goal: Begin discharge teaching Outcome: Completed/Met Date Met:  06/05/12 Reviewed "Living better with heart failure" with pt and his daughter and they watched heart failure video

## 2012-06-05 NOTE — Evaluation (Signed)
Physical Therapy Evaluation Patient Details Name: Dustin [name] MRN: [id number] DOB: 1926/04/24 Today's Date: 06/05/2012 Time: 4098-1191 PT Time Calculation (min): 25 min  PT Assessment / Plan / Recommendation Clinical Impression  Pt adm with CHF.  Needs skilled PT to maximize I and safety so pt can eventually return home with family.  Currently pt requiring assist for all mobility.  Unsure if family can provide 24 hour physical assistance.  If not pt will need ST-SNF again (pt was at [facility] after Dec hospitalization).    PT Assessment  Patient needs continued PT services    Follow Up Recommendations  SNF (unless family can provide 24 hour physical assist.)    Does the patient have the potential to tolerate intense rehabilitation      Barriers to Discharge        Equipment Recommendations  None recommended by PT    Recommendations for Other Services OT consult   Frequency Min 3X/week    Precautions / Restrictions Precautions Precautions: Fall   Pertinent Vitals/Pain N/A      Mobility  Bed Mobility Bed Mobility: Supine to Sit;Sitting - Scoot to Edge of Bed Supine to Sit: 1: +2 Total assist Supine to Sit: Patient Percentage: 60% Sitting - Scoot to Edge of Bed: 3: Mod assist Details for Bed Mobility Assistance: Assist to bring legs over and trunk up. Transfers Transfers: Sit to Stand;Stand to Sit Sit to Stand: 1: +2 Total assist;With upper extremity assist;From bed Sit to Stand: Patient Percentage: 60% Stand to Sit: 1: +2 Total assist;With upper extremity assist;With armrests;To bed;To chair/3-in-1 Stand to Sit: Patient Percentage: 60% Details for Transfer Assistance: Assist to bring hips up and to extend hips and trunk. Verbal cues for hand placement Ambulation/Gait Ambulation/Gait Assistance: 3: Mod assist Ambulation Distance (Feet): 5 Feet Assistive device: Rolling walker Ambulation/Gait Assistance Details: Verbal/tactile cues to extend hips, knees, and  trunk. Gait Pattern: Step-to pattern;Decreased step length - right;Decreased step length - left;Shuffle;Trunk flexed;Left flexed knee in stance;Right flexed knee in stance    Exercises     PT Diagnosis: Difficulty walking;Generalized weakness  PT Problem List: Decreased strength;Decreased activity tolerance;Decreased balance;Decreased mobility;Decreased knowledge of use of DME PT Treatment Interventions: DME instruction;Gait training;Patient/family education;Functional mobility training;Therapeutic activities;Therapeutic exercise;Balance training   PT Goals Acute Rehab PT Goals PT Goal Formulation: With patient Time For Goal Achievement: 06/12/12 Potential to Achieve Goals: Good Pt will go Supine/Side to Sit: with min assist PT Goal: Supine/Side to Sit - Progress: Goal set today Pt will go Sit to Supine/Side: with min assist PT Goal: Sit to Supine/Side - Progress: Goal set today Pt will go Sit to Stand: with min assist PT Goal: Sit to Stand - Progress: Goal set today Pt will go Stand to Sit: with min assist PT Goal: Stand to Sit - Progress: Goal set today Pt will Ambulate: 16 - 50 feet;with min assist;with least restrictive assistive device PT Goal: Ambulate - Progress: Goal set today  Visit Information  Last PT Received On: 06/05/12 Assistance Needed: +2    Subjective Data  Subjective: Pt stated that he had not been down to the dining room in his independent living facility in 3 weeks. Patient Stated Goal: Return home   Prior Functioning  Home Living Lives With: Spouse Available Help at Discharge: Family;Available 24 hours/day (wife can't provide much physical assist.) Type of Home: Apartment Home Access: Level entry Home Layout: One level Home Adaptive Equipment: Dan Humphreys - four wheeled;Wheelchair - manual Additional Comments: Previously a family member had been  able to come stay several weeks with pt to assist. Prior Function Level of Independence: Needs  assistance Comments: Believe was modified Independent with transfers and short distances with rollator Communication Communication: HOH    Cognition  Cognition Overall Cognitive Status: Appears within functional limits for tasks assessed/performed Arousal/Alertness: Awake/alert Orientation Level: Appears intact for tasks assessed Behavior During Session: [facility] for tasks performed    Extremity/Trunk Assessment Right Lower Extremity Assessment RLE ROM/Strength/Tone: Deficits RLE ROM/Strength/Tone Deficits: grossly 3/5 Left Lower Extremity Assessment LLE ROM/Strength/Tone: Deficits LLE ROM/Strength/Tone Deficits: grossly 3/5   Balance Balance Balance Assessed: Yes Static Standing Balance Static Standing - Balance Support: Bilateral upper extremity supported Static Standing - Level of Assistance: 3: Mod assist;4: Min assist  End of Session PT - End of Session Equipment Utilized During Treatment: Gait belt Activity Tolerance: Patient limited by fatigue Patient left: in chair;with call bell/phone within reach Nurse Communication: Mobility status  GP     Keylen Eckenrode 06/05/2012, 3:37 PM  [facility] PT [phone]

## 2012-06-05 NOTE — Progress Notes (Addendum)
Pharmacist Heart Failure Core Measure Documentation  Assessment: Dustin [name] has an EF documented as 20-30%  on 06/02/12 by 2D Echol.  Rationale: Heart failure patients with left ventricular systolic dysfunction (LVSD) and an EF < 40% should be prescribed an angiotensin converting enzyme inhibitor (ACEI) or angiotensin receptor blocker (ARB) at discharge unless a contraindication is documented in the medical record.  This patient is not currently on an ACEI or ARB for HF.  This note is being placed in the record in order to provide documentation that a contraindication to the use of these agents is present for this encounter.  ACE Inhibitor or Angiotensin Receptor Blocker is contraindicated (specify all that apply)  []   ACEI allergy AND ARB allergy []   Angioedema []   Moderate or severe aortic stenosis []   Hyperkalemia []   Hypotension []   Renal artery stenosis [x]   Worsening renal function, preexisting renal disease or dysfunction  Noah Delaine, RPh Clinical Pharmacist  06/05/2012 5:03 PM

## 2012-06-06 LAB — BASIC METABOLIC PANEL
BUN: 55 mg/dL — ABNORMAL HIGH (ref 6–23)
CO2: 35 mEq/L — ABNORMAL HIGH (ref 19–32)
Calcium: 9.3 mg/dL (ref 8.4–10.5)
Chloride: 98 mEq/L (ref 96–112)
Creatinine, Ser: 1.79 mg/dL — ABNORMAL HIGH (ref 0.50–1.35)
GFR calc Af Amer: 39 mL/min — ABNORMAL LOW (ref >90)
GFR calc non Af Amer: 33 mL/min — ABNORMAL LOW (ref >90)
Glucose, Bld: 139 mg/dL — ABNORMAL HIGH (ref 70–99)
Potassium: 3.4 mEq/L — ABNORMAL LOW (ref 3.5–5.1)
Sodium: 140 mEq/L (ref 135–145)

## 2012-06-06 LAB — GLUCOSE, CAPILLARY
Glucose-Capillary: 186 mg/dL — ABNORMAL HIGH (ref 70–99)
Glucose-Capillary: 191 mg/dL — ABNORMAL HIGH (ref 70–99)

## 2012-06-06 LAB — PRO B NATRIURETIC PEPTIDE: Pro B Natriuretic peptide (BNP): 8448 pg/mL — ABNORMAL HIGH (ref 0–450)

## 2012-06-06 MED ORDER — POTASSIUM CHLORIDE CRYS ER 20 MEQ PO TBCR
40.0000 meq | EXTENDED_RELEASE_TABLET | Freq: Once | ORAL | Status: AC
  Administered 2012-06-06: 40 meq via ORAL
  Filled 2012-06-06: qty 2

## 2012-06-06 MED ORDER — FUROSEMIDE 10 MG/ML IJ SOLN
40.0000 mg | Freq: Two times a day (BID) | INTRAMUSCULAR | Status: DC
  Administered 2012-06-06 – 2012-06-07 (×2): 40 mg via INTRAVENOUS
  Filled 2012-06-06 (×3): qty 4

## 2012-06-06 MED ORDER — FUROSEMIDE 10 MG/ML IJ SOLN: 40.0000 mg | Freq: Two times a day (BID) | INTRAMUSCULAR | Status: DC

## 2012-06-06 MED ORDER — SPIRONOLACTONE 12.5 MG HALF TABLET
12.5000 mg | ORAL_TABLET | Freq: Every day | ORAL | Status: DC
  Administered 2012-06-06 – 2012-06-07 (×2): 12.5 mg via ORAL
  Filled 2012-06-06 (×3): qty 1

## 2012-06-06 MED ORDER — HYDROCORTISONE 1 % EX CREA
TOPICAL_CREAM | Freq: Two times a day (BID) | CUTANEOUS | Status: DC
  Administered 2012-06-06 – 2012-06-12 (×12): via TOPICAL
  Filled 2012-06-06 (×2): qty 28

## 2012-06-06 MED ORDER — FUROSEMIDE 8 MG/ML PO SOLN
40.0000 mg | Freq: Once | ORAL | Status: DC
  Filled 2012-06-06: qty 5

## 2012-06-06 MED ORDER — FUROSEMIDE 8 MG/ML PO SOLN
40.0000 mg | Freq: Once | ORAL | Status: AC
  Administered 2012-06-06: 40 mg via ORAL
  Filled 2012-06-06: qty 5

## 2012-06-06 MED ORDER — FLEET ENEMA 7-19 GM/118ML RE ENEM
1.0000 | ENEMA | Freq: Every day | RECTAL | Status: DC | PRN
  Filled 2012-06-06: qty 1

## 2012-06-06 MED ORDER — POLYETHYLENE GLYCOL 3350 17 G PO PACK
17.0000 g | PACK | Freq: Two times a day (BID) | ORAL | Status: DC
  Administered 2012-06-06 – 2012-06-12 (×12): 17 g via ORAL
  Filled 2012-06-06 (×16): qty 1

## 2012-06-06 MED ORDER — POLYETHYLENE GLYCOL 3350 17 G PO PACK: 17.0000 g | PACK | Freq: Every day | ORAL | Status: DC

## 2012-06-06 MED ORDER — HYDROCORTISONE 2.5 % RE CREA
TOPICAL_CREAM | Freq: Three times a day (TID) | RECTAL | Status: DC | PRN
  Administered 2012-06-07 (×2): via RECTAL
  Filled 2012-06-06: qty 28.35

## 2012-06-06 NOTE — Progress Notes (Signed)
Subjective:   Objective: Vital signs in last 24 hours: Temp:  [97.4 F (36.3 C)-97.9 F (36.6 C)] 97.4 F (36.3 C) (04/15 0553) Pulse Rate:  [60] 60 (04/15 0553) Resp:  [18] 18 (04/15 0553) BP: (109-128)/(50-58) 128/58 mmHg (04/15 0553) SpO2:  [93 %-96 %] 93 % (04/15 0832) Weight:  [78.109 kg (172 lb 3.2 oz)] 78.109 kg (172 lb 3.2 oz) (04/15 0553) Weight change: 0.907 kg (2 lb) Last BM Date: 06/02/12 Intake/Output from previous day: +115  Wt 78.1 up from 77.2 actually weighs more today than at anytime this admit.  04/14 0701 - 04/15 0700 In: 1240 [P.O.:1240] Out: 1125 [Urine:1125] Intake/Output this shift:       Lab Results:  Recent Labs  06/04/12 0550  WBC 5.9  HGB 10.1*  HCT 30.8*  PLT 137*   BMET  Recent Labs  06/05/12 0545 06/06/12 0446  NA 139 140  K 3.9 3.4*  CL 99 98  CO2 32 35*  GLUCOSE 114* 139*  BUN 54* 55*  CREATININE 1.77* 1.79*  CALCIUM 9.3 9.3   No results found for this basename: TROPONINI, CK, MB,  in the last 72 hours  Lab Results  Component Value Date   CHOL  Value: 208        ATP III CLASSIFICATION:  <200     mg/dL   Desirable  478-295  mg/dL   Borderline High  >=621    mg/dL   High       * 3/0/8657   HDL 43 10/25/2008   LDLCALC  Value: 140        Total Cholesterol/HDL:CHD Risk Coronary Heart Disease Risk Table                     Men   Women  1/2 Average Risk   3.4   3.3  Average Risk       5.0   4.4  2 X Average Risk   9.6   7.1  3 X Average Risk  23.4   11.0        Use the calculated Patient Ratio above and the CHD Risk Table to determine the patient's CHD Risk.        ATP III CLASSIFICATION (LDL):  <100     mg/dL   Optimal  846-962  mg/dL   Near or Above                    Optimal  130-159  mg/dL   Borderline  952-841  mg/dL   High  >324     mg/dL   Very High* 4/0/1027   TRIG 127 10/25/2008   CHOLHDL 4.8 10/25/2008   Lab Results  Component Value Date   HGBA1C 6.1* 06/02/2012     Lab Results  Component Value Date   TSH 2.076 06/02/2012     Studies/Results: No results found.  Medications: I have reviewed the patient's current medications. Scheduled Meds: . allopurinol  300 mg Oral QPM  . ALPRAZolam  1 mg Oral QHS  . aspirin EC  81 mg Oral Daily  . cholecalciferol  2,000 Units Oral QPM  . enoxaparin (LOVENOX) injection  30 mg Subcutaneous Daily  . furosemide  40 mg Intravenous BID  . insulin aspart  0-9 Units Subcutaneous TID WC  . iron polysaccharides  150 mg Oral q morning - 10a  . isosorbide-hydrALAZINE  2 tablet Oral TID  . levalbuterol  0.63 mg Nebulization BID  . nebivolol  2.5 mg Oral q morning - 10a  . oxyCODONE  10 mg Oral TID  . pantoprazole  40 mg Oral Daily  . ranolazine  500 mg Oral q morning - 10a  . senna-docusate  3 tablet Oral BID  . sertraline  100 mg Oral q morning - 10a  . simvastatin  20 mg Oral q1800  . sodium chloride  3 mL Intravenous Q12H  . zolpidem  5 mg Oral QHS   Continuous Infusions:  PRN Meds:.sodium chloride, acetaminophen, albuterol, ipratropium, morphine injection, nitroGLYCERIN, ondansetron (ZOFRAN) IV, sodium chloride  Assessment/Plan: Principal Problem:   Systolic and diastolic CHF, acute on chronic Active Problems:   CARDIOMYOPATHY, ISCHEMIC - EF 20-30% 2D 06/02/12   Chronic systolic heart failure   COPD   CKD (chronic kidney disease) stage 3, GFR 30-59 ml/min   HLD (hyperlipidemia)   Diabetes   HTN (hypertension)   CAD - Hx of CABG in 1995 with redo in 2004   ICD (implantable cardiac defibrillator) in place   PVD (peripheral vascular disease)- bilat ICA disease  PLAN:See Bryan Hager's note.   LOS: 5 days    Dustin [name],Dustin [name] 06/06/2012, 10:03 AM

## 2012-06-06 NOTE — Progress Notes (Signed)
See Mr. Dustin [name] note.

## 2012-06-06 NOTE — Progress Notes (Signed)
Physical Therapy Treatment Patient Details Name: Dustin [name] MRN: [id number] DOB: Apr 09, 1926 Today's Date: 06/06/2012 Time: 8295-6213 PT Time Calculation (min): 28 min  PT Assessment / Plan / Recommendation Comments on Treatment Session  Pt adm with CHF.  Pt continues to have very limited mobility due to weakness.    Follow Up Recommendations  SNF     Does the patient have the potential to tolerate intense rehabilitation     Barriers to Discharge        Equipment Recommendations  None recommended by PT    Recommendations for Other Services OT consult  Frequency Min 3X/week   Plan Discharge plan remains appropriate;Frequency remains appropriate    Precautions / Restrictions Precautions Precautions: Fall   Pertinent Vitals/Pain Pain from hemorrhoids.    Mobility  Transfers Sit to Stand: 1: +2 Total assist;With upper extremity assist;With armrests;From chair/3-in-1 Sit to Stand: Patient Percentage: 50% Stand to Sit: 1: +2 Total assist;With upper extremity assist;With armrests;To chair/3-in-1 Stand to Sit: Patient Percentage: 50% Details for Transfer Assistance: Assist to bring hips up and to extend hips and trunk. Verbal cues for hand placement Ambulation/Gait Ambulation/Gait Assistance: 1: +2 Total assist Ambulation/Gait: Patient Percentage: 50% Ambulation Distance (Feet): 4 Feet (3' x 1, 4' x 1) Assistive device: Rolling walker Ambulation/Gait Assistance Details: Pt with difficulty maintaining hip/knee extension.  Required heavy assist and verbal cues at times to keep from "sinking" Gait Pattern: Step-to pattern;Decreased step length - right;Decreased step length - left;Shuffle;Trunk flexed;Left flexed knee in stance;Right flexed knee in stance    Exercises     PT Diagnosis:    PT Problem List:   PT Treatment Interventions:     PT Goals Acute Rehab PT Goals PT Goal: Sit to Stand - Progress: Progressing toward goal PT Goal: Stand to Sit - Progress: Progressing  toward goal PT Goal: Ambulate - Progress: Progressing toward goal  Visit Information  Last PT Received On: 06/06/12 Assistance Needed: +2    Subjective Data  Subjective: Pt's daughter stated if he doesn't walk every day his mobility declines rapidly.   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

## 2012-06-06 NOTE — Progress Notes (Addendum)
[facility]  Subjective: Breathing better.  Bottom hurts because of hemorrhoids.  Objective: Vital signs in last 24 hours: Temp:  [97.4 F (36.3 C)-97.9 F (36.6 C)] 97.4 F (36.3 C) (04/15 0553) Pulse Rate:  [60] 60 (04/15 0553) Resp:  [18] 18 (04/15 0553) BP: (109-128)/(50-58) 128/58 mmHg (04/15 0553) SpO2:  [93 %-96 %] 93 % (04/15 0832) Weight:  [78.109 kg (172 lb 3.2 oz)] 78.109 kg (172 lb 3.2 oz) (04/15 0553) Last BM Date: 06/02/12  Intake/Output from previous day: 04/14 0701 - 04/15 0700 In: 1240 [P.O.:1240] Out: 1125 [Urine:1125] Intake/Output this shift:    Medications Current Facility-Administered Medications  Medication Dose Route Frequency Provider Last Rate Last Dose  . 0.9 %  sodium chloride infusion  250 mL Intravenous PRN Catarina Hartshorn, MD      . acetaminophen (TYLENOL) tablet 650 mg  650 mg Oral Q4H PRN Catarina Hartshorn, MD      . ipratropium (ATROVENT) nebulizer solution 0.5 mg  0.5 mg Nebulization Q4H PRN Catarina Hartshorn, MD   0.5 mg at 06/03/12 2116   And  . albuterol (PROVENTIL) (5 MG/ML) 0.5% nebulizer solution 2.5 mg  2.5 mg Nebulization Q4H PRN Catarina Hartshorn, MD   2.5 mg at 06/03/12 2116  . allopurinol (ZYLOPRIM) tablet 300 mg  300 mg Oral QPM Catarina Hartshorn, MD   300 mg at 06/05/12 1721  . ALPRAZolam Prudy Feeler) tablet 1 mg  1 mg Oral QHS Catarina Hartshorn, MD   1 mg at 06/05/12 2208  . aspirin EC tablet 81 mg  81 mg Oral Daily Catarina Hartshorn, MD   81 mg at 06/06/12 1005  . cholecalciferol (VITAMIN D) tablet 2,000 Units  2,000 Units Oral QPM Catarina Hartshorn, MD   2,000 Units at 06/05/12 1721  . enoxaparin (LOVENOX) injection 30 mg  30 mg Subcutaneous Daily Catarina Hartshorn, MD   30 mg at 06/06/12 1004  . furosemide (LASIX) injection 40 mg  40 mg Intravenous BID Catarina Hartshorn, MD   40 mg at 06/06/12 1003  . insulin aspart (novoLOG) injection 0-9 Units  0-9 Units Subcutaneous TID WC Catarina Hartshorn, MD   1 Units at 06/06/12 [phone]  . iron polysaccharides (NIFEREX) capsule 150 mg  150 mg  Oral q morning - 10a Catarina Hartshorn, MD   150 mg at 06/06/12 1009  . isosorbide-hydrALAZINE (BIDIL) 20-37.5 MG per tablet 2 tablet  2 tablet Oral TID Lennette Bihari, MD   2 tablet at 06/06/12 1004  . levalbuterol (XOPENEX) nebulizer solution 0.63 mg  0.63 mg Nebulization BID Lennette Bihari, MD   0.63 mg at 06/06/12 3086  . morphine 2 MG/ML injection 1 mg  1 mg Intravenous Q4H PRN Cephas Darby, RN      . nebivolol (BYSTOLIC) tablet 2.5 mg  2.5 mg Oral q morning - 10a Catarina Hartshorn, MD   2.5 mg at 06/06/12 1009  . nitroGLYCERIN (NITROSTAT) SL tablet 0.4 mg  0.4 mg Sublingual Q5 Min x 3 PRN Catarina Hartshorn, MD      . ondansetron [facility]) injection 4 mg  4 mg Intravenous Q6H PRN Catarina Hartshorn, MD      . oxyCODONE (Oxy IR/ROXICODONE) immediate release tablet 10 mg  10 mg Oral TID Catarina Hartshorn, MD   10 mg at 06/06/12 1016  . pantoprazole (PROTONIX) EC tablet 40 mg  40 mg Oral Daily Catarina Hartshorn, MD   40 mg at 06/06/12 1017  . ranolazine (RANEXA) 12 hr tablet 500 mg  500  mg Oral q morning - 10a Catarina Hartshorn, MD   500 mg at 06/06/12 1011  . senna-docusate (Senokot-S) tablet 3 tablet  3 tablet Oral BID Catarina Hartshorn, MD   3 tablet at 06/06/12 1005  . sertraline (ZOLOFT) tablet 100 mg  100 mg Oral q morning - 10a Catarina Hartshorn, MD   100 mg at 06/06/12 1007  . simvastatin (ZOCOR) tablet 20 mg  20 mg Oral q1800 Catarina Hartshorn, MD   20 mg at 06/05/12 1721  . sodium chloride 0.9 % injection 3 mL  3 mL Intravenous Q12H Catarina Hartshorn, MD   3 mL at 06/06/12 1011  . sodium chloride 0.9 % injection 3 mL  3 mL Intravenous PRN Catarina Hartshorn, MD   3 mL at 06/05/12 1826  . zolpidem (AMBIEN) tablet 5 mg  5 mg Oral QHS Catarina Hartshorn, MD   5 mg at 06/05/12 2208    PE: General appearance: alert, cooperative and no distress Lungs: Bilteral rales greater on the right.  No wheeze Heart: irregularly irregular rhythm and 1/6 MM loudest at the apex. Abdomen: Positive BS.  Mild distention  Extremities: 1+ LEE Pulses: 2+ and symmetric Neurologic: Grossly normal  Lab  Results:   Recent Labs  06/04/12 0550  WBC 5.9  HGB 10.1*  HCT 30.8*  PLT 137*   BMET  Recent Labs  06/04/12 0550 06/05/12 0545 06/06/12 0446  NA 139 139 140  K 3.9 3.9 3.4*  CL 99 99 98  CO2 29 32 35*  GLUCOSE 136* 114* 139*  BUN 49* 54* 55*  CREATININE 1.83* 1.77* 1.79*  CALCIUM 9.3 9.3 9.3    Assessment/Plan   Principal Problem:   Systolic and diastolic CHF, acute on chronic Active Problems:   CARDIOMYOPATHY, ISCHEMIC - EF 20-30% 2D 06/02/12   Chronic systolic heart failure   COPD   CKD (chronic kidney disease) stage 3, GFR 30-59 ml/min   HLD (hyperlipidemia)   Diabetes   HTN (hypertension)   CAD - Hx of CABG in 1995 with redo in 2004   ICD (implantable cardiac defibrillator) in place   PVD (peripheral vascular disease)- bilat ICA disease  Plan:   Net Fluids:  +115 and one urine occurrence last 24 hours.  Stable SCr.  Lasix 40mg  IV BID, BiDil.  BP and HR stable.  Replace K+.  BNP improved.  Will given extra dose of IV lasix today.  Repeat CXR in AM.  Hospice following.  EF 20-30% with diffuse hypokinesis.  Grade 2 diastolic dysfunction.   Preparation H for hemorrhoids.  Miralax for constipation.   Glycemic control steady on current regimen.   Continue statin.   LOS: 5 days    HAGER, BRYAN 06/06/2012 10:18 AM  I have seen and evaluated the patient this PM along with Wilburt Finlay, PA. I agree with his findings, examination as well as impression recommendations.  Overall breathing a bit better.  UOP not very brisk, but steady.  Tolerating increased dose of BiDil. Agree with additional Lasix dose & will start Aldactone.  Will keep BB @ current dose until more compensated.  Will order Anusol /Preparation H for hemorrhoids. & Miralax.  Major concern is constipation.  Bowel sounds are present & normal & is passing flatus, just no BM.  --> if no BM by tomorrow, would check KUB & consider enema.  Continue slow diuresis as renal Fxn remains  stable. PT/OT.  Lovenox for DVT Prophylaxis.  Marykay Lex, M.D., M.S. [facility]  [address] [place], [place]  [address]  [phone] Pager # [phone] 06/06/2012 2:03 PM

## 2012-06-06 NOTE — Progress Notes (Addendum)
Room 4706 - Carthel Cargile - [facility] RN Visit-R.Oneill Bais RN  Related admission to [facility] diagnosis of CHF.   Pt is DNR code.    Pt alert & oriented, sitting up in lounge chair with feet elevated, with complaints of pain in the buttocks - pt is leaning to the left to relieve pressure/pain in the R/buttocks area. Per chart notes, pt has Stage I pressure on sacral area.   Dtr Matisse present and hospital SW Hornell C.  Both were discussing SNF w/rehab vs return to [place] and obtaining 24/7 assistance and PT in the home.  SNF search will be sought and dtr advised.  Dtr aware of revocation of hospice in rehab facility and ability to re-enroll following - they have gone this route previously.  Patient's home medication list is on shadow chart.   Please call [facility] @ [phone]- ask for RN Liaison or after hours,ask for on-call RN with any hospice needs.   Thank you.  [name], RN  [facility]  Hospice Liaison

## 2012-06-07 ENCOUNTER — Encounter (HOSPITAL_COMMUNITY): Payer: Self-pay | Admitting: Cardiology

## 2012-06-07 DIAGNOSIS — C9201 Acute myeloblastic leukemia, in remission: Secondary | ICD-10-CM | POA: Insufficient documentation

## 2012-06-07 LAB — GLUCOSE, CAPILLARY
Glucose-Capillary: 160 mg/dL — ABNORMAL HIGH (ref 70–99)
Glucose-Capillary: 131 mg/dL — ABNORMAL HIGH (ref 70–99)

## 2012-06-07 LAB — BASIC METABOLIC PANEL
BUN: 46 mg/dL — ABNORMAL HIGH (ref 6–23)
Calcium: 10.2 mg/dL (ref 8.4–10.5)
GFR calc non Af Amer: 43 mL/min — ABNORMAL LOW (ref >90)
Glucose, Bld: 129 mg/dL — ABNORMAL HIGH (ref 70–99)
Sodium: 142 mEq/L (ref 135–145)

## 2012-06-07 MED ORDER — FUROSEMIDE 10 MG/ML IJ SOLN
60.0000 mg | Freq: Two times a day (BID) | INTRAMUSCULAR | Status: DC
  Administered 2012-06-07 – 2012-06-09 (×4): 60 mg via INTRAVENOUS
  Filled 2012-06-07 (×4): qty 6

## 2012-06-07 MED ORDER — FUROSEMIDE 10 MG/ML IJ SOLN
20.0000 mg | Freq: Once | INTRAMUSCULAR | Status: AC
  Administered 2012-06-07: 20 mg via INTRAVENOUS
  Filled 2012-06-07: qty 2

## 2012-06-07 NOTE — Progress Notes (Signed)
[facility] of  ([facility]) Sw note: Pt sitting in recliner, eating lunch. Son-in-law Bruce at bedside. Pt stated that he felt cold and continued to worry about his weight gain. Bruce stated that he was unsure of plans at DC. PTA Pt lived with his wife and a senior apartment. [facility] visits and provides Rn visits and Hospice aide services, but Pt/wife are alone most of the time and apartment staff provide no hands-on care to Pt. Bruce stated he was unsure if Pt could return there safely or if family would need to look into placing Pt at a facility, late last year Pt stayed at [facility], prior to returning home with his wife. Per Smitty Cords, Pt's dtr Janeth Rase is working on speaking to hospital Sw about DC plans. Sw provided active/reflective listening and emotional support during visit. Hospice Sw will continue to provide support during hospital stay. Lorain Childes, [place], [facility]-Sw # [phone]

## 2012-06-07 NOTE — Progress Notes (Signed)
Pharmacist Heart Failure Core Measure Documentation  Assessment: Dustin [name] has an EF documented as 20% on 06/02/12 by echo.  Rationale: Heart failure patients with left ventricular systolic dysfunction (LVSD) and an EF < 40% should be prescribed an angiotensin converting enzyme inhibitor (ACEI) or angiotensin receptor blocker (ARB) at discharge unless a contraindication is documented in the medical record.  This patient is not currently on an ACEI or ARB for HF.  This note is being placed in the record in order to provide documentation that a contraindication to the use of these agents is present for this encounter.  ACE Inhibitor or Angiotensin Receptor Blocker is contraindicated (specify all that apply)  []   ACEI allergy AND ARB allergy []   Angioedema []   Moderate or severe aortic stenosis []   Hyperkalemia []   Hypotension []   Renal artery stenosis [x]   Worsening renal function, preexisting renal disease or dysfunction   Dustin [name] 06/07/2012 2:19 PM

## 2012-06-07 NOTE — Progress Notes (Signed)
Pt. Seen and examined. Agree with the NP/PA-C note as written.  Diuresing slowly, BNP down, creatinine is improving, but still dyspneic. No chest pain. +BM's yesterday, but weight climbing. Increase lasix to 60 IV BID for more brisk diuresis. Follow daily weights.  Chrystie Nose, MD, [facility] Attending Cardiologist [facility]

## 2012-06-07 NOTE — Progress Notes (Signed)
Subjective: Complains of SOB, hemorrhoidal pain improved  Objective: Vital signs in last 24 hours: Temp:  [97.5 F (36.4 C)-97.8 F (36.6 C)] 97.8 F (36.6 C) (04/16 1610) Pulse Rate:  [62-66] 66 (04/16 0614) Resp:  [18] 18 (04/16 0614) BP: (118-133)/(52-59) 133/59 mmHg (04/16 0614) SpO2:  [92 %-95 %] 95 % (04/16 0614) Weight:  [79.6 kg (175 lb 7.8 oz)] 79.6 kg (175 lb 7.8 oz) (04/16 0614) Weight change: 1.491 kg (3 lb 4.6 oz) Last BM Date: 06/06/12 Intake/Output from previous day: -186   Wt 79.6 kg  Continues to climb--up from admit wt by 7 lbs 04/15 0701 - 04/16 0700 In: 240 [P.O.:240] Out: 426 [Urine:425; Stool:1] Intake/Output this shift:    PE: General:alert and oriented, beginning nebulizer treatment Neck:mild JVD Heart:RRR, S1S2 occ PACs Lungs:clear ant. Without wheezes RUE:AVWU, non tender, + BS Ext:no edema    Lab Results: No results found for this basename: WBC, HGB, HCT, PLT,  in the last 72 hours BMET  Recent Labs  06/05/12 0545 06/06/12 0446  NA 139 140  K 3.9 3.4*  CL 99 98  CO2 32 35*  GLUCOSE 114* 139*  BUN 54* 55*  CREATININE 1.77* 1.79*  CALCIUM 9.3 9.3   No results found for this basename: TROPONINI, CK, MB,  in the last 72 hours  Lab Results  Component Value Date   CHOL  Value: 208        ATP III CLASSIFICATION:  <200     mg/dL   Desirable  981-191  mg/dL   Borderline High  >=478    mg/dL   High       * 04/03/5619   HDL 43 10/25/2008   LDLCALC  Value: 140        Total Cholesterol/HDL:CHD Risk Coronary Heart Disease Risk Table                     Men   Women  1/2 Average Risk   3.4   3.3  Average Risk       5.0   4.4  2 X Average Risk   9.6   7.1  3 X Average Risk  23.4   11.0        Use the calculated Patient Ratio above and the CHD Risk Table to determine the patient's CHD Risk.        ATP III CLASSIFICATION (LDL):  <100     mg/dL   Optimal  308-657  mg/dL   Near or Above                    Optimal  130-159  mg/dL   Borderline  846-962  mg/dL    High  >952     mg/dL   Very High* 09/26/1322   TRIG 127 10/25/2008   CHOLHDL 4.8 10/25/2008   Lab Results  Component Value Date   HGBA1C 6.1* 06/02/2012     Lab Results  Component Value Date   TSH 2.076 06/02/2012      Studies/Results: No results found.  Medications: I have reviewed the patient's current medications. Scheduled Meds: . allopurinol  300 mg Oral QPM  . ALPRAZolam  1 mg Oral QHS  . aspirin EC  81 mg Oral Daily  . cholecalciferol  2,000 Units Oral QPM  . enoxaparin (LOVENOX) injection  30 mg Subcutaneous Daily  . furosemide  40 mg Intravenous BID  . hydrocortisone cream   Topical BID  . insulin aspart  0-9 Units Subcutaneous TID WC  . iron polysaccharides  150 mg Oral q morning - 10a  . isosorbide-hydrALAZINE  2 tablet Oral TID  . levalbuterol  0.63 mg Nebulization BID  . nebivolol  2.5 mg Oral q morning - 10a  . oxyCODONE  10 mg Oral TID  . pantoprazole  40 mg Oral Daily  . polyethylene glycol  17 g Oral BID  . ranolazine  500 mg Oral q morning - 10a  . senna-docusate  3 tablet Oral BID  . sertraline  100 mg Oral q morning - 10a  . simvastatin  20 mg Oral q1800  . sodium chloride  3 mL Intravenous Q12H  . spironolactone  12.5 mg Oral Daily  . zolpidem  5 mg Oral QHS   Continuous Infusions:  PRN Meds:.sodium chloride, acetaminophen, albuterol, hydrocortisone, ipratropium, morphine injection, nitroGLYCERIN, ondansetron (ZOFRAN) IV, sodium chloride, sodium phosphate  Assessment/Plan: Principal Problem:   Systolic and diastolic CHF, acute on chronic Active Problems:   CARDIOMYOPATHY, ISCHEMIC - EF 20-30% 2D 06/02/12   Chronic systolic heart failure   COPD   CKD (chronic kidney disease) stage 3, GFR 30-59 ml/min   HLD (hyperlipidemia)   Diabetes   HTN (hypertension)   CAD - Hx of CABG in 1995 with redo in 2004   ICD (implantable cardiac defibrillator) in place   PVD (peripheral vascular disease)- bilat ICA disease   AML (acute myeloid leukemia) in  remission  PLAN: weight continues to climb, voided twice yesterday per record.  EF 20-30% with diffuse hypokinesis. Grade 2 diastolic dysfunction.  Glucose stable, K+ decreased yesterday will recheck. Yesterday's pro BNP was 8448 slightly down.  Continues with SOB, no chest pain. Wt up,  I&O overall low 240 in and 425 out.  Total -1726 for hospital stay. On lasix IV BID.   LOS: 6 days   Time spent with pt. :15 minutes. Shaylie Eklund R 06/07/2012, 7:57 AM

## 2012-06-07 NOTE — Progress Notes (Addendum)
Room 4709 - Dustin [name] -[facility] RN Visit-R.Kazuto Sevey RN  Related admission to [facility] diagnosis of CHF.   Pt is DNR code.    Pt arousable & oriented, lying in bed, without  complaints of hemorrhoid pains - pt states this pain has eased off.  When asked about being tired, pt states he is tired of being tired.   When asked about chest pain, pt attempted to respond stating the pain has graduated....then pt drifted off to sleep.    No family present.   Patient's home medication list is on shadow chart.   Please call [facility] @ [phone]- ask for RN Liaison or after hours,ask for on-call RN with any hospice needs.   Thank you.  [name], RN  [facility]  Hospice Liaison

## 2012-06-08 ENCOUNTER — Inpatient Hospital Stay (HOSPITAL_COMMUNITY)

## 2012-06-08 ENCOUNTER — Encounter (HOSPITAL_COMMUNITY): Payer: Self-pay

## 2012-06-08 LAB — GLUCOSE, CAPILLARY

## 2012-06-08 LAB — BASIC METABOLIC PANEL
CO2: 37 mEq/L — ABNORMAL HIGH (ref 19–32)
Chloride: 97 mEq/L (ref 96–112)
Sodium: 141 mEq/L (ref 135–145)

## 2012-06-08 LAB — PRO B NATRIURETIC PEPTIDE: Pro B Natriuretic peptide (BNP): 12776 pg/mL — ABNORMAL HIGH (ref 0–450)

## 2012-06-08 MED ORDER — SPIRONOLACTONE 25 MG PO TABS
25.0000 mg | ORAL_TABLET | Freq: Every day | ORAL | Status: DC
  Administered 2012-06-09 – 2012-06-12 (×4): 25 mg via ORAL
  Filled 2012-06-08 (×4): qty 1

## 2012-06-08 MED ORDER — SPIRONOLACTONE 12.5 MG HALF TABLET
12.5000 mg | ORAL_TABLET | Freq: Once | ORAL | Status: AC
  Administered 2012-06-08: 12.5 mg via ORAL
  Filled 2012-06-08: qty 1

## 2012-06-08 NOTE — Clinical Social Work Placement (Addendum)
     Clinical Social Work Department CLINICAL SOCIAL WORK PLACEMENT NOTE 06/12/2012  Patient:  Dustin [name], Dustin [name]  Account Number:  [id number] Admit date:  06/01/2012  Clinical Social Worker:  Lupita Leash Keyontay Stolz, LCSWA  Date/time:  06/06/2012 05:30 PM  Clinical Social Work is seeking post-discharge placement for this patient at the following level of care:   SKILLED NURSING   (*CSW will update this form in Epic as items are completed)   06/06/2012  Patient/family provided with [facility] Department of Clinical Social Works list of facilities offering this level of care within the geographic area requested by the patient (or if unable, by the patients family).  06/06/2012  Patient/family informed of their freedom to choose among providers that offer the needed level of care, that participate in Medicare, Medicaid or managed care program needed by the patient, have an available bed and are willing to accept the patient.  06/06/2012  Patient/family informed of [facility] ownership interest in [facility], as well as of the fact that they are under no obligation to receive care at this facility.  PASARR submitted to EDS on  PASARR number received from EDS on   FL2 transmitted to all facilities in geographic area requested by pt/family on  06/06/2012 FL2 transmitted to all facilities within larger geographic area on   Patient informed that his/her managed care company has contracts with or will negotiate with  certain facilities, including the following:   Has existing pasar     Patient/family informed of bed offers received:  06/09/2012 Patient chooses bed at [facility] [place] Physician recommends and patient chooses bed at    Patient to be transferred to [facility], [place] on  06/12/2012 Patient to be transferred to facility by ambulance  Sharin Mons)  The following physician request were entered in Epic:   Additional Comments: Patient's  family requested ALF placement but over weekend changed their mind and now agree to SNF placement (which was PT's recommendation.)  Patient defers to family.  DC arrangements are completed; notified SNF and patient's nurse - Hessie Diener are aware of d/c. Lorri Frederick. Charon Smedberg, LCSWA  [phone]

## 2012-06-08 NOTE — Clinical Social Work Psychosocial (Addendum)
    Clinical Social Work Department BRIEF PSYCHOSOCIAL ASSESSMENT 06/08/2012  Patient:  Dustin [name], Dustin [name]     Account Number:  [id number]     Admit date:  06/01/2012  Clinical Social Worker:  Tiburcio Pea  Date/Time:  06/05/2012 05:00 PM  Referred by:  Physician  Date Referred:  06/05/2012 Referred for  SNF Placement   Other Referral:   Interview type:  Other - See comment Other interview type:   [name]    PSYCHOSOCIAL DATA Living Status:  WIFE Admitted from facility:   Level of care:   Primary support name:  Dustin [name] (c) [phone] Primary support relationship to patient:  CHILD, ADULT Degree of support available:   Strong support  [name]  (health is fair)    [name]  [phone]    CURRENT CONCERNS Current Concerns  Post-Acute Placement   Other Concerns:    SOCIAL WORK ASSESSMENT / PLAN [name]  CSW met with patient and his daugther Dustin [name] today to discuss d/c planning needs. Physical Therapy is recommending either SNF for rehab or 24 hour assistance at home.  Both options were discussed at length;  duaghter states that patient and his wife's health have been deteriorating and he has been helping her at home.  In the past, patient's brother came to stay with them from [place] for about 5 weeks to "help out."   They are unsure if he woudl be able to do this again.  Patient and daughter agreed to a SNF search for short term rehab. Daughter is currently looking at Assisted Living facilities that might be able to accomodate both patient and his wife in the future.  SNF list provided;  SNF search will be initiated with FL2 placed on shadow chart for MD's signature.  Patient has been followed by [facility] and Palliative Care for the past 3 years per daughter.  CSW spoke with [facility] RN regarding  above.     Assessment/plan status:  Psychosocial Support/Ongoing Assessment of Needs Other assessment/ plan:   Information/referral to community resources:   SNF list given to patient and daughter  Aftercare needs- Home health/ private duty services discussed    PATIENT'S/FAMILY'S RESPONSE TO PLAN OF CARE: Pateint is alert and oriented- he was hoping to return home with his wife but recognizes that he would benefit from short term SNF. Patient and daughter agree to SNF search. He has been a resident of [facility] in the past but does not want to return there.  CSW will assist with placement.

## 2012-06-08 NOTE — Progress Notes (Signed)
CSW spoke with patient's daughter Derrek Gu and bed offers given.  Patient is not yet stable for d/c; has a weight gain and is still on IV lasix.  Daughter will talk to her father and consider bed offers and call CSW back tomorrow.  Lorri Frederick. [place]  [phone]

## 2012-06-08 NOTE — Progress Notes (Signed)
Room 4706 - Dustin [name] - [facility] RN Visit-R.Shylo Dillenbeck RN  Related admission to [facility] diagnosis of CHF.  Pt is DNR code.    Pt alert & oriented,sitting upright in bed following return from radiology.  Pt has complaints of 6/10 pain "all-over" specifically hips, legs, back.  Pt states he had a difficult night last pm - states he became wedged between mattress and overlay mattress and is claustrophobic and "called and called" to try to get assistance.  Pt also states he was given "all his meds at once" last pm - felt RN was new - she wouldn't listen to him when he asked what he was getting and why all at the same time - discussed with charge RN today-Courtney.  Staff RN will assess pain presently and states pt has MS 1mg  available.   Dtr Matissa present.  Patient's home medication list is on shadow chart.   Family visiting SNF for rehab at present to make a decision on discharge.   Please call [facility] @ [phone]- ask for RN Liaison or after hours,ask for on-call RN with any hospice needs.   Thank you.  [name], RN  [facility]  Hospice Liaison

## 2012-06-08 NOTE — Progress Notes (Signed)
Physical Therapy Treatment Patient Details Name: Dustin [name] MRN: [id number] DOB: 07-08-1926 Today's Date: 06/08/2012 Time: 3086-5784 PT Time Calculation (min): 34 min  PT Assessment / Plan / Recommendation Comments on Treatment Session  Pt adm with CHF.  Pt continues to have very limited mobility due to weakness.  Patient able to increase ambulation this session with ability to ambulate 2 trials with seated rest break in between.      Follow Up Recommendations  SNF     Does the patient have the potential to tolerate intense rehabilitation     Barriers to Discharge        Equipment Recommendations  None recommended by PT    Recommendations for Other Services    Frequency Min 3X/week   Plan Discharge plan remains appropriate;Frequency remains appropriate    Precautions / Restrictions Precautions Precautions: Fall Restrictions Weight Bearing Restrictions: No   Pertinent Vitals/Pain no apparent distress     Mobility  Bed Mobility Bed Mobility: Supine to Sit;Sit to Supine Supine to Sit: 1: +2 Total assist Supine to Sit: Patient Percentage: 60% Sitting - Scoot to Edge of Bed: 4: Min guard Sit to Supine: 1: +2 Total assist Sit to Supine: Patient Percentage: 60% Details for Bed Mobility Assistance: Assist to bring legs over and trunk up. Transfers Transfers: Stand to Sit;Sit to Stand Sit to Stand: 1: +2 Total assist;With upper extremity assist;With armrests;From chair/3-in-1;From bed Sit to Stand: Patient Percentage: 90% Stand to Sit: 1: +2 Total assist;With upper extremity assist;With armrests;To chair/3-in-1;To bed Stand to Sit: Patient Percentage: 90% Details for Transfer Assistance: Assist to shift weight to LE from hips. Verbal cues for hand placement Ambulation/Gait Ambulation/Gait Assistance: 3: Mod assist Ambulation/Gait: Patient Percentage: 80% Assistive device: Rolling walker Ambulation/Gait Assistance Details: Patient posteriorly shifts hips.  Patient needs  assistance to maintain balance, facilitation for erect posture.  Pt ambulates flexed at hips & knees in flexed position-- pt reports tightness in hamstrings which inhibit knee extension.  Verbal cues for posture, body positioning inside RW, & safety.   Gait Pattern: Step-to pattern;Decreased step length - right;Decreased step length - left;Shuffle;Trunk flexed Gait velocity: Decreased.    Exercises Total Joint Exercises Long Arc Quad: AROM;Both;10 reps; Has difficulty achieving full range due to tightness per pt.     PT Diagnosis:    PT Problem List:   PT Treatment Interventions:     PT Goals Acute Rehab PT Goals PT Goal: Supine/Side to Sit - Progress: Progressing toward goal PT Goal: Sit to Supine/Side - Progress: Progressing toward goal PT Goal: Sit to Stand - Progress: Progressing toward goal PT Goal: Stand to Sit - Progress: Progressing toward goal PT Goal: Ambulate - Progress: Progressing toward goal  Visit Information  Last PT Received On: 06/08/12 Assistance Needed: +2    Subjective Data      Cognition  Cognition Arousal/Alertness: Awake/alert Behavior During Therapy: WFL for tasks assessed/performed Overall Cognitive Status: Within Functional Limits for tasks assessed Area of Impairment: Memory Memory: Decreased short-term memory    Balance     End of Session PT - End of Session Equipment Utilized During Treatment: Gait belt;Oxygen Activity Tolerance: Patient limited by fatigue Patient left: with call bell/phone within reach;with family/visitor present;in bed   GP     Maximino Greenland JEAN SPTA 06/08/2012, 11:04 AM    Verdell Face, PTA [phone] 06/08/2012

## 2012-06-08 NOTE — Progress Notes (Addendum)
[facility]  Subjective: Feels like he is breathing better.  He stated that he needs to be up moving in order to get better.  Objective: Vital signs in last 24 hours: Temp:  [97.6 F (36.4 C)-98.1 F (36.7 C)] 98.1 F (36.7 C) (04/17 0449) Pulse Rate:  [62-76] 62 (04/17 0449) Resp:  [18] 18 (04/17 0449) BP: (121-139)/(42-69) 121/42 mmHg (04/17 0449) SpO2:  [88 %-97 %] 93 % (04/17 0452) Weight:  [72.621 kg (160 lb 1.6 oz)-73.3 kg (161 lb 9.6 oz)] 73.3 kg (161 lb 9.6 oz) (04/17 0733) Last BM Date: 06/06/12  Intake/Output from previous day: 04/16 0701 - 04/17 0700 In: 663 [P.O.:660; I.V.:3] Out: 1901 [Urine:1900; Stool:1] Intake/Output this shift:    Medications Current Facility-Administered Medications  Medication Dose Route Frequency Provider Last Rate Last Dose  . 0.9 %  sodium chloride infusion  250 mL Intravenous PRN Catarina Hartshorn, MD      . acetaminophen (TYLENOL) tablet 650 mg  650 mg Oral Q4H PRN Catarina Hartshorn, MD      . ipratropium (ATROVENT) nebulizer solution 0.5 mg  0.5 mg Nebulization Q4H PRN Catarina Hartshorn, MD   0.5 mg at 06/03/12 2116   And  . albuterol (PROVENTIL) (5 MG/ML) 0.5% nebulizer solution 2.5 mg  2.5 mg Nebulization Q4H PRN Catarina Hartshorn, MD   2.5 mg at 06/03/12 2116  . allopurinol (ZYLOPRIM) tablet 300 mg  300 mg Oral QPM Catarina Hartshorn, MD   300 mg at 06/07/12 1708  . ALPRAZolam Prudy Feeler) tablet 1 mg  1 mg Oral QHS Catarina Hartshorn, MD   1 mg at 06/07/12 2219  . aspirin EC tablet 81 mg  81 mg Oral Daily Catarina Hartshorn, MD   81 mg at 06/07/12 1041  . cholecalciferol (VITAMIN D) tablet 2,000 Units  2,000 Units Oral QPM Catarina Hartshorn, MD   2,000 Units at 06/07/12 1707  . enoxaparin (LOVENOX) injection 30 mg  30 mg Subcutaneous Daily Catarina Hartshorn, MD   30 mg at 06/07/12 1040  . furosemide (LASIX) injection 60 mg  60 mg Intravenous BID Chrystie Nose, MD   60 mg at 06/07/12 1708  . hydrocortisone (ANUSOL-HC) 2.5 % rectal cream   Rectal Q8H PRN Marykay Lex, MD       . hydrocortisone cream 1 %   Topical BID Wilburt Finlay, PA-C      . insulin aspart (novoLOG) injection 0-9 Units  0-9 Units Subcutaneous TID WC Catarina Hartshorn, MD   1 Units at 06/08/12 [phone]  . iron polysaccharides (NIFEREX) capsule 150 mg  150 mg Oral q morning - 10a Catarina Hartshorn, MD   150 mg at 06/07/12 1040  . isosorbide-hydrALAZINE (BIDIL) 20-37.5 MG per tablet 2 tablet  2 tablet Oral TID Lennette Bihari, MD   2 tablet at 06/07/12 2159  . levalbuterol (XOPENEX) nebulizer solution 0.63 mg  0.63 mg Nebulization BID Lennette Bihari, MD   0.63 mg at 06/07/12 2000  . morphine 2 MG/ML injection 1 mg  1 mg Intravenous Q4H PRN Cephas Darby, RN      . nebivolol (BYSTOLIC) tablet 2.5 mg  2.5 mg Oral q morning - 10a Catarina Hartshorn, MD   2.5 mg at 06/07/12 1041  . nitroGLYCERIN (NITROSTAT) SL tablet 0.4 mg  0.4 mg Sublingual Q5 Min x 3 PRN Catarina Hartshorn, MD      . ondansetron [facility]) injection 4 mg  4 mg Intravenous Q6H PRN Catarina Hartshorn, MD      .  oxyCODONE (Oxy IR/ROXICODONE) immediate release tablet 10 mg  10 mg Oral TID Catarina Hartshorn, MD   10 mg at 06/07/12 2219  . pantoprazole (PROTONIX) EC tablet 40 mg  40 mg Oral Daily Catarina Hartshorn, MD   40 mg at 06/07/12 1041  . polyethylene glycol (MIRALAX / GLYCOLAX) packet 17 g  17 g Oral BID Wilburt Finlay, PA-C   17 g at 06/07/12 2218  . ranolazine (RANEXA) 12 hr tablet 500 mg  500 mg Oral q morning - 10a Catarina Hartshorn, MD   500 mg at 06/07/12 1041  . senna-docusate (Senokot-S) tablet 3 tablet  3 tablet Oral BID Catarina Hartshorn, MD   3 tablet at 06/07/12 2200  . sertraline (ZOLOFT) tablet 100 mg  100 mg Oral q morning - 10a Catarina Hartshorn, MD   100 mg at 06/07/12 1041  . simvastatin (ZOCOR) tablet 20 mg  20 mg Oral q1800 Catarina Hartshorn, MD   20 mg at 06/07/12 1707  . sodium chloride 0.9 % injection 3 mL  3 mL Intravenous Q12H Catarina Hartshorn, MD   3 mL at 06/07/12 2200  . sodium chloride 0.9 % injection 3 mL  3 mL Intravenous PRN Catarina Hartshorn, MD   3 mL at 06/05/12 1826  . sodium phosphate (FLEET) 7-19 GM/118ML  enema 1 enema  1 enema Rectal Daily PRN Wilburt Finlay, PA-C      . spironolactone (ALDACTONE) tablet 12.5 mg  12.5 mg Oral Daily Marykay Lex, MD   12.5 mg at 06/07/12 1041  . zolpidem (AMBIEN) tablet 5 mg  5 mg Oral QHS Catarina Hartshorn, MD   5 mg at 06/06/12 2200    PE: General appearance: alert, cooperative and no distress Lungs: Bilateral basilar rales.  No Wheeze. Heart: irregularly irregular rhythm and 1/6 MM loudest at the apex. Extremities: No LEE Pulses: 2+ and symmetric Neurologic: Grossly normal  Lab Results:  No results found for this basename: WBC, HGB, HCT, PLT,  in the last 72 hours BMET  Recent Labs  06/06/12 0446 06/07/12 0825 06/08/12 0555  NA 140 142 141  K 3.4* 3.7 3.7  CL 98 97 97  CO2 35* 37* 37*  GLUCOSE 139* 129* 123*  BUN 55* 46* 43*  CREATININE 1.79* 1.46* 1.40*  CALCIUM 9.3 10.2 9.8   TELEMETRY: Mostly Sinus with intermittent A sensing - V pacing, A-V pacing.  6 beat NSVT  Assessment/Plan   Principal Problem:   Systolic and diastolic CHF, acute on chronic Active Problems:   CARDIOMYOPATHY, ISCHEMIC - EF 20-30% 2D 06/02/12   Chronic systolic heart failure   COPD   CKD (chronic kidney disease) stage 3, GFR 30-59 ml/min   HLD (hyperlipidemia)   Diabetes   HTN (hypertension)   CAD - Hx of CABG in 1995 with redo in 2004   ICD (implantable cardiac defibrillator) in place   PVD (peripheral vascular disease)- bilat ICA disease   AML (acute myeloid leukemia) in remission  Plan:   UOP(1970ml) improved considerably with increased lasix.  Net fluids: - .  SCr has improved as well.  BNP up to 12,776 from 8448.?  Increased Wt.?  His LEE has resolved.  I am not sure why BNP is going up especially with improved renal function and UOP.  Continue current therapy.  Will need to consider transitioning to PO perhaps tomorrow.  Recheck BNP in AM.    PT did not see yesterday but needs to see today.    6 beat wide VT.  On  low dose Bystolic.   LOS: 7 days     Dustin [name], Dustin [name] 06/08/2012 8:24 AM  I have seen and evaluated the patient this AM along with Wilburt Finlay, PA. I agree with his findings, examination as well as impression recommendations.  As is usual, daily wgts & UOP do not correlate & proBNP is not helpful for following progression.  Despite this, his overal wgt is down from admission ~77-78kg to ~72-73 kg (not sure how ~0.7 kg increase in <3 hrs this AM form 0500 to 0730).   At this point, he feels subjectively better, but still has some basal (R>L rales on exam).  Has had a BM.    For now, we will continue with current diuresis today & consider transitioning to PO Lasix tomorrow (after AM dose) - 60mg  PO BID    Short runs of NSVT are not unexpected with his degree of ICM -- BiVICD in place & on BB.  On Ranexa for microvascular ischemia.    On high dose BiDil for afterload reduction -- with fluctuating renal function, has not been on ACE-I (may be able to re-look this if his function continues to stabilize). BP & HR stable.  Will increase Spironolactone to 25 mg daily.  Need PT & OT eval -- need to get him up & about to test his strength & overall functional status, as we are anticipating d/c over the weekend at the earliest.  Glycemic control is stable -- no changes Xopenex for COPD SQ Enoxaparin for DVT prophylaxis.  PPI or GI.  Marykay Lex, M.D., M.S. [facility] [address] [place], [place]  [address]  [phone] Pager # [phone] 06/08/2012 9:03 AM

## 2012-06-09 LAB — BASIC METABOLIC PANEL
BUN: 43 mg/dL — ABNORMAL HIGH (ref 6–23)
CO2: 38 mEq/L — ABNORMAL HIGH (ref 19–32)
Glucose, Bld: 120 mg/dL — ABNORMAL HIGH (ref 70–99)
Potassium: 3.6 mEq/L (ref 3.5–5.1)
Sodium: 141 mEq/L (ref 135–145)

## 2012-06-09 LAB — GLUCOSE, CAPILLARY
Glucose-Capillary: 192 mg/dL — ABNORMAL HIGH (ref 70–99)
Glucose-Capillary: 159 mg/dL — ABNORMAL HIGH (ref 70–99)
Glucose-Capillary: 159 mg/dL — ABNORMAL HIGH (ref 70–99)

## 2012-06-09 MED ORDER — FUROSEMIDE 40 MG PO TABS
60.0000 mg | ORAL_TABLET | Freq: Two times a day (BID) | ORAL | Status: DC
  Administered 2012-06-09 – 2012-06-12 (×6): 60 mg via ORAL
  Filled 2012-06-09 (×10): qty 1

## 2012-06-09 MED ORDER — TUBERCULIN PPD 5 UNIT/0.1ML ID SOLN
5.0000 [IU] | Freq: Once | INTRADERMAL | Status: AC
  Administered 2012-06-09: 5 [IU] via INTRADERMAL
  Filled 2012-06-09: qty 0.1

## 2012-06-09 NOTE — Progress Notes (Signed)
[facility]  Subjective: In good spirits and smiling.  Breathing better.  Objective: Vital signs in last 24 hours: Temp:  [98 F (36.7 C)-98.4 F (36.9 C)] 98 F (36.7 C) (04/18 0634) Pulse Rate:  [58-70] 70 (04/18 0634) Resp:  [17-18] 18 (04/18 0634) BP: (97-122)/(47-63) 122/47 mmHg (04/18 0634) SpO2:  [94 %-98 %] 97 % (04/18 0634) Weight:  [71.351 kg (157 lb 4.8 oz)] 71.351 kg (157 lb 4.8 oz) (04/18 0634) Last BM Date: 06/08/12  Intake/Output from previous day: 04/17 0701 - 04/18 0700 In: 720 [P.O.:720] Out: 1725 [Urine:1725] Intake/Output this shift: Total I/O In: 120 [P.O.:120] Out: -   Medications Current Facility-Administered Medications  Medication Dose Route Frequency Provider Last Rate Last Dose  . 0.9 %  sodium chloride infusion  250 mL Intravenous PRN Catarina Hartshorn, MD      . acetaminophen (TYLENOL) tablet 650 mg  650 mg Oral Q4H PRN Catarina Hartshorn, MD   650 mg at 06/08/12 1327  . ipratropium (ATROVENT) nebulizer solution 0.5 mg  0.5 mg Nebulization Q4H PRN Catarina Hartshorn, MD   0.5 mg at 06/03/12 2116   And  . albuterol (PROVENTIL) (5 MG/ML) 0.5% nebulizer solution 2.5 mg  2.5 mg Nebulization Q4H PRN Catarina Hartshorn, MD   2.5 mg at 06/03/12 2116  . allopurinol (ZYLOPRIM) tablet 300 mg  300 mg Oral QPM Catarina Hartshorn, MD   300 mg at 06/08/12 1759  . ALPRAZolam Prudy Feeler) tablet 1 mg  1 mg Oral QHS Catarina Hartshorn, MD   1 mg at 06/08/12 2134  . aspirin EC tablet 81 mg  81 mg Oral Daily Catarina Hartshorn, MD   81 mg at 06/08/12 0949  . cholecalciferol (VITAMIN D) tablet 2,000 Units  2,000 Units Oral QPM Catarina Hartshorn, MD   2,000 Units at 06/08/12 1759  . enoxaparin (LOVENOX) injection 30 mg  30 mg Subcutaneous Daily Catarina Hartshorn, MD   30 mg at 06/08/12 0915  . furosemide (LASIX) injection 60 mg  60 mg Intravenous BID Chrystie Nose, MD   60 mg at 06/08/12 1800  . hydrocortisone (ANUSOL-HC) 2.5 % rectal cream   Rectal Q8H PRN Marykay Lex, MD      . hydrocortisone cream 1 %    Topical BID Wilburt Finlay, PA-C      . insulin aspart (novoLOG) injection 0-9 Units  0-9 Units Subcutaneous TID WC Catarina Hartshorn, MD   1 Units at 06/09/12 [phone]  . iron polysaccharides (NIFEREX) capsule 150 mg  150 mg Oral q morning - 10a Catarina Hartshorn, MD   150 mg at 06/08/12 0948  . isosorbide-hydrALAZINE (BIDIL) 20-37.5 MG per tablet 2 tablet  2 tablet Oral TID Lennette Bihari, MD   2 tablet at 06/08/12 2134  . levalbuterol (XOPENEX) nebulizer solution 0.63 mg  0.63 mg Nebulization BID Lennette Bihari, MD   0.63 mg at 06/08/12 2110  . morphine 2 MG/ML injection 1 mg  1 mg Intravenous Q4H PRN Cephas Darby, RN      . nebivolol (BYSTOLIC) tablet 2.5 mg  2.5 mg Oral q morning - 10a Catarina Hartshorn, MD   2.5 mg at 06/08/12 0948  . nitroGLYCERIN (NITROSTAT) SL tablet 0.4 mg  0.4 mg Sublingual Q5 Min x 3 PRN Catarina Hartshorn, MD      . ondansetron [facility]) injection 4 mg  4 mg Intravenous Q6H PRN Catarina Hartshorn, MD      . oxyCODONE (Oxy IR/ROXICODONE) immediate release tablet 10 mg  10 mg Oral TID Catarina Hartshorn, MD   10 mg at 06/08/12 2134  . pantoprazole (PROTONIX) EC tablet 40 mg  40 mg Oral Daily Catarina Hartshorn, MD   40 mg at 06/08/12 0948  . polyethylene glycol (MIRALAX / GLYCOLAX) packet 17 g  17 g Oral BID Wilburt Finlay, PA-C   17 g at 06/08/12 1002  . ranolazine (RANEXA) 12 hr tablet 500 mg  500 mg Oral q morning - 10a Catarina Hartshorn, MD   500 mg at 06/08/12 0948  . senna-docusate (Senokot-S) tablet 3 tablet  3 tablet Oral BID Catarina Hartshorn, MD   3 tablet at 06/08/12 2134  . sertraline (ZOLOFT) tablet 100 mg  100 mg Oral q morning - 10a Catarina Hartshorn, MD   100 mg at 06/08/12 0948  . simvastatin (ZOCOR) tablet 20 mg  20 mg Oral q1800 Catarina Hartshorn, MD   20 mg at 06/08/12 1800  . sodium chloride 0.9 % injection 3 mL  3 mL Intravenous Q12H Catarina Hartshorn, MD   3 mL at 06/08/12 2135  . sodium chloride 0.9 % injection 3 mL  3 mL Intravenous PRN Catarina Hartshorn, MD   3 mL at 06/05/12 1826  . sodium phosphate (FLEET) 7-19 GM/118ML enema 1 enema  1 enema Rectal  Daily PRN Wilburt Finlay, PA-C      . spironolactone (ALDACTONE) tablet 25 mg  25 mg Oral Daily Marykay Lex, MD      . zolpidem [facility]) tablet 5 mg  5 mg Oral QHS Catarina Hartshorn, MD   5 mg at 06/06/12 2200    PE: General appearance: alert, cooperative and no distress  Lungs: MIld bilateral basilar rales. No Wheeze.  Heart: irregularly irregular rhythm and 1/6 MM loudest at the apex.  Extremities: No LEE  Pulses: 2+ and symmetric  Neurologic: Grossly normal   Lab Results:  No results found for this basename: WBC, HGB, HCT, PLT,  in the last 72 hours BMET  Recent Labs  06/07/12 0825 06/08/12 0555 06/09/12 0600  NA 142 141 141  K 3.7 3.7 3.6  CL 97 97 98  CO2 37* 37* 38*  GLUCOSE 129* 123* 120*  BUN 46* 43* 43*  CREATININE 1.46* 1.40* 1.49*  CALCIUM 10.2 9.8 9.7    Assessment/Plan  Principal Problem:   Systolic and diastolic CHF, acute on chronic Active Problems:   CARDIOMYOPATHY, ISCHEMIC - EF 20-30% 2D 06/02/12   Chronic systolic heart failure   COPD   CKD (chronic kidney disease) stage 3, GFR 30-59 ml/min   HLD (hyperlipidemia)   Diabetes   HTN (hypertension)   CAD - Hx of CABG in 1995 with redo in 2004   ICD (implantable cardiac defibrillator) in place   PVD (peripheral vascular disease)- bilat ICA disease   AML (acute myeloid leukemia) in remission  Plan:  Net fluids:  - .  Good diuresis with 60mg  BID IV lasix.  Will transition to PO lasix today.  BP stable.  CSW working on SNF placement. Likely DC tomorrow.   LOS: 8 days    Angelie Kram 06/09/2012 8:54 AM

## 2012-06-09 NOTE — Progress Notes (Signed)
CSW was notified by patient's son in law- Caleen Jobs today that family wants patient to be placed in an assisted living at d/c rather than a SNF.  It is the family's goal to place patient and then bring his wife into the same facility for admission rather than seek SNF placement and then have to move him again.  Family has requested [facility].  Fl2 updated and referral sent to [facility] at [facility].  TB test ordered and will be administered.  Facility is working with family on making admission arrangements; they anticipate being able to accept patient on Monday 06/12/12.  Fl2 is on chart for MD's signature. Patient is being assessed by admissions coordinator this afternoon. Notified son-in-law of above. Per MD- patient will hopefully be stable for d/c tomorrow but facility cannot accept patient until Monday. Patient is unable to return home safely as there is no one to provide in home care; his wife cannot help him at home and he is requiring total care for ambulation.  CSW will facilitate d/c. Lorri Frederick. [place]  [phone]

## 2012-06-09 NOTE — Progress Notes (Addendum)
I have seen and evaluated the patient this AM along with Wilburt Finlay, PA. I agree with his findings, examination as well as impression recommendations.  Good night of diuresis!  Looks good today.  In good spirits.   PT recommends short term SNF for rehab - CSW is working on bed -- anticipate probably ready for d/c tomorrow.  ?? Would he be suitable for 24/7 home care?  Defer to PT -- I think pt would prefer this. Changing to PO Lasix -- have added Spironolactone.  BP & HR stable.     Marykay Lex, M.D., M.S. [facility] [address] [place], [place]  [address]  [phone] Pager # [phone] 06/09/2012 9:44 AM

## 2012-06-09 NOTE — Progress Notes (Signed)
Room 4706 - Dustin [name] - [facility] RN Visit-R.Etsuko Dierolf RN  Related admission to [facility] diagnosis of CHF.  Pt is DNR code.    Pt alert & oriented, lying in bed, reading the paper without complaints of pain or discomfort-states he was sleeping great and "they came in to do something" and he hasn't been able to grab even a nap since. Pt admits to being "down" as he would like to return home with 24/7 assistance in the home-no decision on SNF Rehab at this point- he does state he tries not to get down though...  No family present- dtr has father in law in [place] that is terminally ill and will be visiting him today. Patient's home medication list is on shadow chart.   Please call [facility] @ [phone]- ask for RN Liaison or after hours,ask for on-call RN with any hospice needs.   Thank you.  [name], RN  [facility]  Hospice Liaison

## 2012-06-10 LAB — GLUCOSE, CAPILLARY
Glucose-Capillary: 138 mg/dL — ABNORMAL HIGH (ref 70–99)
Glucose-Capillary: 237 mg/dL — ABNORMAL HIGH (ref 70–99)

## 2012-06-10 LAB — BASIC METABOLIC PANEL
Chloride: 97 mEq/L (ref 96–112)
GFR calc Af Amer: 50 mL/min — ABNORMAL LOW (ref >90)
Potassium: 3.8 mEq/L (ref 3.5–5.1)

## 2012-06-10 MED ORDER — LEVALBUTEROL HCL 0.63 MG/3ML IN NEBU: 0.6300 mg | INHALATION_SOLUTION | Freq: Four times a day (QID) | RESPIRATORY_TRACT | Status: DC | PRN

## 2012-06-10 NOTE — Progress Notes (Signed)
Nurse tech assisted patient up to recliner chair for dinner and states that patient did okay with one assist to the chair. This has been a change from 2-3 assist to chair or bedside commode. Will continue to monitor as needed to end of shift.

## 2012-06-10 NOTE — Progress Notes (Signed)
Related admission, CHF exacerbation.  DNR on chart.  Found patient resting, with breakfast tray at his bedside.  FLACC score 0, no family present.  Reviewed chart and patient continues to receive Lasix for fluid management and has a negative fluid balance with stable BP and HR.  Anticipate discharge to [facility] per family request on Monday.  Continue current plan of supportive care and encourage a call to Hospice at [phone] with any needs.  April Costella Hatcher, RN, BSN Hospice

## 2012-06-10 NOTE — Progress Notes (Signed)
[facility]  Subjective: Doing well.  No apparent orthopnea  Objective: Vital signs in last 24 hours: Temp:  [98.1 F (36.7 C)-98.7 F (37.1 C)] 98.1 F (36.7 C) (04/19 0533) Pulse Rate:  [56-63] 56 (04/19 0533) Resp:  [18] 18 (04/19 0533) BP: (110-131)/(47-67) 110/50 mmHg (04/19 0533) SpO2:  [95 %-97 %] 97 % (04/19 0700) Weight:  [71.668 kg (158 lb)] 71.668 kg (158 lb) (04/19 0533) Last BM Date: 06/09/12  Intake/Output from previous day: 04/18 0701 - 04/19 0700 In: 840 [P.O.:840] Out: 701 [Urine:701] Intake/Output this shift:    Medications Current Facility-Administered Medications  Medication Dose Route Frequency Provider Last Rate Last Dose  . 0.9 %  sodium chloride infusion  250 mL Intravenous PRN Catarina Hartshorn, MD      . acetaminophen (TYLENOL) tablet 650 mg  650 mg Oral Q4H PRN Catarina Hartshorn, MD   650 mg at 06/08/12 1327  . albuterol (PROVENTIL) (5 MG/ML) 0.5% nebulizer solution 2.5 mg  2.5 mg Nebulization Q4H PRN Catarina Hartshorn, MD   2.5 mg at 06/03/12 2116  . allopurinol (ZYLOPRIM) tablet 300 mg  300 mg Oral QPM Catarina Hartshorn, MD   300 mg at 06/09/12 1653  . ALPRAZolam Prudy Feeler) tablet 1 mg  1 mg Oral QHS Catarina Hartshorn, MD   1 mg at 06/09/12 2120  . aspirin EC tablet 81 mg  81 mg Oral Daily Catarina Hartshorn, MD   81 mg at 06/09/12 1024  . cholecalciferol (VITAMIN D) tablet 2,000 Units  2,000 Units Oral QPM Catarina Hartshorn, MD   2,000 Units at 06/09/12 1653  . enoxaparin (LOVENOX) injection 30 mg  30 mg Subcutaneous Daily Catarina Hartshorn, MD   30 mg at 06/09/12 1038  . furosemide (LASIX) tablet 60 mg  60 mg Oral BID Wilburt Finlay, PA-C   60 mg at 06/09/12 1653  . hydrocortisone (ANUSOL-HC) 2.5 % rectal cream   Rectal Q8H PRN Marykay Lex, MD      . hydrocortisone cream 1 %   Topical BID Wilburt Finlay, PA-C      . insulin aspart (novoLOG) injection 0-9 Units  0-9 Units Subcutaneous TID WC Catarina Hartshorn, MD   1 Units at 06/10/12 0705  . iron polysaccharides (NIFEREX) capsule 150 mg  150  mg Oral q morning - 10a Catarina Hartshorn, MD   150 mg at 06/09/12 1025  . isosorbide-hydrALAZINE (BIDIL) 20-37.5 MG per tablet 2 tablet  2 tablet Oral TID Lennette Bihari, MD   2 tablet at 06/09/12 2121  . levalbuterol (XOPENEX) nebulizer solution 0.63 mg  0.63 mg Nebulization QID PRN Lennette Bihari, MD      . morphine 2 MG/ML injection 1 mg  1 mg Intravenous Q4H PRN Cephas Darby, RN      . nebivolol (BYSTOLIC) tablet 2.5 mg  2.5 mg Oral q morning - 10a Catarina Hartshorn, MD   2.5 mg at 06/09/12 1025  . nitroGLYCERIN (NITROSTAT) SL tablet 0.4 mg  0.4 mg Sublingual Q5 Min x 3 PRN Catarina Hartshorn, MD      . ondansetron [facility]) injection 4 mg  4 mg Intravenous Q6H PRN Catarina Hartshorn, MD      . oxyCODONE (Oxy IR/ROXICODONE) immediate release tablet 10 mg  10 mg Oral TID Catarina Hartshorn, MD   10 mg at 06/09/12 2120  . pantoprazole (PROTONIX) EC tablet 40 mg  40 mg Oral Daily Catarina Hartshorn, MD   40 mg at 06/09/12 1024  . polyethylene glycol (MIRALAX /  GLYCOLAX) packet 17 g  17 g Oral BID Wilburt Finlay, PA-C   17 g at 06/09/12 2120  . ranolazine (RANEXA) 12 hr tablet 500 mg  500 mg Oral q morning - 10a Catarina Hartshorn, MD   500 mg at 06/09/12 1024  . senna-docusate (Senokot-S) tablet 3 tablet  3 tablet Oral BID Catarina Hartshorn, MD   3 tablet at 06/09/12 2120  . sertraline (ZOLOFT) tablet 100 mg  100 mg Oral q morning - 10a Catarina Hartshorn, MD   100 mg at 06/09/12 1024  . simvastatin (ZOCOR) tablet 20 mg  20 mg Oral q1800 Catarina Hartshorn, MD   20 mg at 06/09/12 1653  . sodium chloride 0.9 % injection 3 mL  3 mL Intravenous Q12H Catarina Hartshorn, MD   3 mL at 06/09/12 2121  . sodium chloride 0.9 % injection 3 mL  3 mL Intravenous PRN Catarina Hartshorn, MD   3 mL at 06/05/12 1826  . sodium phosphate (FLEET) 7-19 GM/118ML enema 1 enema  1 enema Rectal Daily PRN Wilburt Finlay, PA-C      . spironolactone (ALDACTONE) tablet 25 mg  25 mg Oral Daily Marykay Lex, MD   25 mg at 06/09/12 1023  . tuberculin injection 5 Units  5 Units Intradermal Once Wilburt Finlay, PA-C   5 Units at  06/09/12 1427  . zolpidem (AMBIEN) tablet 5 mg  5 mg Oral QHS Catarina Hartshorn, MD   5 mg at 06/06/12 2200    PE: General appearance: alert, cooperative and no distress  Lungs: MIld bilateral basilar rales. No Wheeze.  Heart: irregularly irregular rhythm and 1/6 MM loudest at the apex.  Extremities: No LEE  Pulses: 2+ and symmetric  Neurologic: Grossly normal    Lab Results:  No results found for this basename: WBC, HGB, HCT, PLT,  in the last 72 hours BMET  Recent Labs  06/08/12 0555 06/09/12 0600 06/10/12 0515  NA 141 141 142  K 3.7 3.6 3.8  CL 97 98 97  CO2 37* 38* 37*  GLUCOSE 123* 120* 127*  BUN 43* 43* 46*  CREATININE 1.40* 1.49* 1.44*  CALCIUM 9.8 9.7 9.7    Assessment/Plan  Principal Problem:   Systolic and diastolic CHF, acute on chronic Active Problems:   CARDIOMYOPATHY, ISCHEMIC - EF 20-30% 2D 06/02/12   Chronic systolic heart failure   COPD   CKD (chronic kidney disease) stage 3, GFR 30-59 ml/min   HLD (hyperlipidemia)   Diabetes   HTN (hypertension)   CAD - Hx of CABG in 1995 with redo in 2004   ICD (implantable cardiac defibrillator) in place   PVD (peripheral vascular disease)- bilat ICA disease   AML (acute myeloid leukemia) in remission  Plan:  On lasix 60mg  PO BID and net fluids are still negative.  UOP decreased appropriately.  SCr is stable.  BP and HR stable.  Will be DCd to [facility] on Monday.  He is ready to go from our standpoint.   LOS: 9 days    HAGER, BRYAN 06/10/2012 8:28 AM   Agree with note written by Jones Skene [facility]  Looks good. Diuresed. Appears euvolemic. VSS. Exam benign. Plan SNF Monday.  Runell Gess 06/10/2012 9:22 AM

## 2012-06-11 LAB — GLUCOSE, CAPILLARY
Glucose-Capillary: 141 mg/dL — ABNORMAL HIGH (ref 70–99)
Glucose-Capillary: 185 mg/dL — ABNORMAL HIGH (ref 70–99)

## 2012-06-11 LAB — BASIC METABOLIC PANEL
Calcium: 9.6 mg/dL (ref 8.4–10.5)
Creatinine, Ser: 1.46 mg/dL — ABNORMAL HIGH (ref 0.50–1.35)
GFR calc Af Amer: 49 mL/min — ABNORMAL LOW (ref >90)
GFR calc non Af Amer: 43 mL/min — ABNORMAL LOW (ref >90)

## 2012-06-11 NOTE — Progress Notes (Signed)
Pt still concern about going to Rehab.

## 2012-06-11 NOTE — Progress Notes (Signed)
[facility]  Subjective: Doing well. Concerned about financial burden of assisted Living.  Objective: Vital signs in last 24 hours: Temp:  [98.1 F (36.7 C)-98.6 F (37 C)] 98.1 F (36.7 C) (04/20 0607) Pulse Rate:  [59-81] 78 (04/20 0607) Resp:  [18-20] 20 (04/20 0607) BP: (108-137)/(45-67) 111/45 mmHg (04/20 0607) SpO2:  [93 %-99 %] 93 % (04/20 0607) Weight:  [71.2 kg (156 lb 15.5 oz)] 71.2 kg (156 lb 15.5 oz) (04/20 0607) Last BM Date: 06/10/12  Intake/Output from previous day: 04/19 0701 - 04/20 0700 In: 723 [P.O.:720; I.V.:3] Out: 1250 [Urine:1250] Intake/Output this shift: Total I/O In: 240 [P.O.:240] Out: -   Medications Current Facility-Administered Medications  Medication Dose Route Frequency Provider Last Rate Last Dose  . 0.9 %  sodium chloride infusion  250 mL Intravenous PRN Catarina Hartshorn, MD      . acetaminophen (TYLENOL) tablet 650 mg  650 mg Oral Q4H PRN Catarina Hartshorn, MD   650 mg at 06/08/12 1327  . albuterol (PROVENTIL) (5 MG/ML) 0.5% nebulizer solution 2.5 mg  2.5 mg Nebulization Q4H PRN Catarina Hartshorn, MD   2.5 mg at 06/03/12 2116  . allopurinol (ZYLOPRIM) tablet 300 mg  300 mg Oral QPM Catarina Hartshorn, MD   300 mg at 06/10/12 1737  . ALPRAZolam Prudy Feeler) tablet 1 mg  1 mg Oral QHS Catarina Hartshorn, MD   1 mg at 06/10/12 2130  . aspirin EC tablet 81 mg  81 mg Oral Daily Catarina Hartshorn, MD   81 mg at 06/10/12 1057  . cholecalciferol (VITAMIN D) tablet 2,000 Units  2,000 Units Oral QPM Catarina Hartshorn, MD   2,000 Units at 06/10/12 1730  . enoxaparin (LOVENOX) injection 30 mg  30 mg Subcutaneous Daily Catarina Hartshorn, MD   30 mg at 06/10/12 1059  . furosemide (LASIX) tablet 60 mg  60 mg Oral BID Wilburt Finlay, PA-C   60 mg at 06/10/12 1730  . hydrocortisone (ANUSOL-HC) 2.5 % rectal cream   Rectal Q8H PRN Marykay Lex, MD      . hydrocortisone cream 1 %   Topical BID Wilburt Finlay, PA-C      . insulin aspart (novoLOG) injection 0-9 Units  0-9 Units Subcutaneous TID WC Catarina Hartshorn, MD   1 Units at 06/11/12 [phone]  . iron polysaccharides (NIFEREX) capsule 150 mg  150 mg Oral q morning - 10a Catarina Hartshorn, MD   150 mg at 06/10/12 1059  . isosorbide-hydrALAZINE (BIDIL) 20-37.5 MG per tablet 2 tablet  2 tablet Oral TID Lennette Bihari, MD   2 tablet at 06/10/12 2130  . levalbuterol (XOPENEX) nebulizer solution 0.63 mg  0.63 mg Nebulization QID PRN Lennette Bihari, MD      . morphine 2 MG/ML injection 1 mg  1 mg Intravenous Q4H PRN Cephas Darby, RN      . nebivolol (BYSTOLIC) tablet 2.5 mg  2.5 mg Oral q morning - 10a Catarina Hartshorn, MD   2.5 mg at 06/10/12 1059  . nitroGLYCERIN (NITROSTAT) SL tablet 0.4 mg  0.4 mg Sublingual Q5 Min x 3 PRN Catarina Hartshorn, MD      . ondansetron [facility]) injection 4 mg  4 mg Intravenous Q6H PRN Catarina Hartshorn, MD      . oxyCODONE (Oxy IR/ROXICODONE) immediate release tablet 10 mg  10 mg Oral TID Catarina Hartshorn, MD   10 mg at 06/10/12 2130  . pantoprazole (PROTONIX) EC tablet 40 mg  40 mg Oral Daily Onalee Hua  Tat, MD   40 mg at 06/10/12 1105  . polyethylene glycol (MIRALAX / GLYCOLAX) packet 17 g  17 g Oral BID Wilburt Finlay, PA-C   17 g at 06/10/12 2129  . ranolazine (RANEXA) 12 hr tablet 500 mg  500 mg Oral q morning - 10a Catarina Hartshorn, MD   500 mg at 06/10/12 1058  . senna-docusate (Senokot-S) tablet 3 tablet  3 tablet Oral BID Catarina Hartshorn, MD   3 tablet at 06/10/12 2130  . sertraline (ZOLOFT) tablet 100 mg  100 mg Oral q morning - 10a Catarina Hartshorn, MD   100 mg at 06/10/12 1058  . simvastatin (ZOCOR) tablet 20 mg  20 mg Oral q1800 Catarina Hartshorn, MD   20 mg at 06/10/12 1730  . sodium chloride 0.9 % injection 3 mL  3 mL Intravenous Q12H Catarina Hartshorn, MD   3 mL at 06/10/12 2130  . sodium chloride 0.9 % injection 3 mL  3 mL Intravenous PRN Catarina Hartshorn, MD   3 mL at 06/05/12 1826  . sodium phosphate (FLEET) 7-19 GM/118ML enema 1 enema  1 enema Rectal Daily PRN Wilburt Finlay, PA-C      . spironolactone (ALDACTONE) tablet 25 mg  25 mg Oral Daily Marykay Lex, MD   25 mg at 06/10/12 1057  .  tuberculin injection 5 Units  5 Units Intradermal Once Wilburt Finlay, PA-C   5 Units at 06/09/12 1427  . zolpidem (AMBIEN) tablet 5 mg  5 mg Oral QHS Catarina Hartshorn, MD   5 mg at 06/06/12 2200    PE: General appearance: alert, cooperative and no distress  Lungs: MIld bilateral basilar rales. No Wheeze.  Heart: irregularly irregular rhythm and 1/6 MM loudest at the apex.  Extremities: No LEE  Pulses: 2+ and symmetric  Neurologic: Grossly normal    Lab Results:  No results found for this basename: WBC, HGB, HCT, PLT,  in the last 72 hours BMET  Recent Labs  06/09/12 0600 06/10/12 0515 06/11/12 0520  NA 141 142 140  K 3.6 3.8 3.5  CL 98 97 96  CO2 38* 37* 38*  GLUCOSE 120* 127* 131*  BUN 43* 46* 45*  CREATININE 1.49* 1.44* 1.46*  CALCIUM 9.7 9.7 9.6      Assessment/Plan   Principal Problem:   Systolic and diastolic CHF, acute on chronic Active Problems:   CARDIOMYOPATHY, ISCHEMIC - EF 20-30% 2D 06/02/12   Chronic systolic heart failure   COPD   CKD (chronic kidney disease) stage 3, GFR 30-59 ml/min   HLD (hyperlipidemia)   Diabetes   HTN (hypertension)   CAD - Hx of CABG in 1995 with redo in 2004   ICD (implantable cardiac defibrillator) in place   PVD (peripheral vascular disease)- bilat ICA disease   AML (acute myeloid leukemia) in remission  Plan: On lasix 60mg  PO BID and net fluids are still negative. UOP good. SCr is stable. BP and HR stable. Will be DCd to [facility] on Monday. He is ready to go from our standpoint.  He wants to go home and not to [facility].  He is concerned about financial burden.    LOS: 10 days    HAGER, BRYAN 06/11/2012 8:40 AM  Agree with note written by Jones Skene [facility]  Agree with above. Appears euvolemic. Good UOP. On diuretics. OK to go to SNF tomorrow.   Runell Gess 06/11/2012 8:56 AM

## 2012-06-11 NOTE — Progress Notes (Signed)
Patient admitted with CHF exacerbation.  DNR on chart.  Found patient resting, no family present.  FLACC score 0.  Reviewed chart and no changes noted overnight.  Patient was able to mobilize to chair with assist and tolerated well.  Patient continues to receive Lasix for fluid management, continue current plan of care.  Anticipate discharge to [facility] on Monday per family request.  Encourage a call to Hospice at [phone] with any needs.  April Costella Hatcher, RN, BSN Hospice

## 2012-06-11 NOTE — Progress Notes (Signed)
PPD read. PPD is negative

## 2012-06-12 LAB — BASIC METABOLIC PANEL
BUN: 48 mg/dL — ABNORMAL HIGH (ref 6–23)
Chloride: 94 mEq/L — ABNORMAL LOW (ref 96–112)
Creatinine, Ser: 1.6 mg/dL — ABNORMAL HIGH (ref 0.50–1.35)
GFR calc Af Amer: 44 mL/min — ABNORMAL LOW (ref >90)

## 2012-06-12 LAB — GLUCOSE, CAPILLARY
Glucose-Capillary: 131 mg/dL — ABNORMAL HIGH (ref 70–99)
Glucose-Capillary: 190 mg/dL — ABNORMAL HIGH (ref 70–99)

## 2012-06-12 MED ORDER — FUROSEMIDE 20 MG PO TABS: 40.0000 mg | ORAL_TABLET | Freq: Two times a day (BID) | ORAL | Status: DC

## 2012-06-12 MED ORDER — ISOSORB DINITRATE-HYDRALAZINE 20-37.5 MG PO TABS: 2.0000 | ORAL_TABLET | Freq: Three times a day (TID) | ORAL | Status: AC

## 2012-06-12 MED ORDER — HYDROCORTISONE 2.5 % RE CREA: TOPICAL_CREAM | Freq: Three times a day (TID) | RECTAL | Status: DC | PRN

## 2012-06-12 MED ORDER — SPIRONOLACTONE 25 MG PO TABS: 25.0000 mg | ORAL_TABLET | Freq: Every day | ORAL | Status: DC

## 2012-06-12 MED ORDER — POLYETHYLENE GLYCOL 3350 17 G PO PACK: 17.0000 g | PACK | Freq: Every day | ORAL | Status: AC

## 2012-06-12 NOTE — Progress Notes (Signed)
Room 4706 - Adar Breau - [facility] RN Visit-R.Rudy Luhmann RN  Related admission to [facility] diagnosis of CHF.   Pt is DNR code.    Pt alert & oriented,walking with PT today - pt has not been up to walk since ls\ast PT session on Thursday.  PT to report to staff RN.  Pt had complaints of pain in back of knees due to lack of movement. Son in Advertising account planner  Present for portion of visit - dtr Matissa's father in law passed in [place].   Pt discharging today to [facility] for rehab.  [facility] SW present for signature on revocation papers.   Please call [facility] @ [phone]- ask for RN Liaison or after hours,ask for on-call RN with any hospice needs.   Thank you.  [name], RN  [facility]  Hospice Liaison

## 2012-06-12 NOTE — Progress Notes (Signed)
Subjective:  Feels good.  No CP or SOB.  He wants to leave  Objective:  Vital Signs in the last 24 hours: Temp:  [97.7 F (36.5 C)-98.1 F (36.7 C)] 97.8 F (36.6 C) (04/21 0503) Pulse Rate:  [57-80] 80 (04/21 0503) Resp:  [20] 20 (04/21 0503) BP: (106-120)/(44-54) 106/49 mmHg (04/21 0503) SpO2:  [95 %-98 %] 98 % (04/21 0503) Weight:  [71.5 kg (157 lb 10.1 oz)] 71.5 kg (157 lb 10.1 oz) (04/21 0503)  Intake/Output from previous day: 04/20 0701 - 04/21 0700 In: 840 [P.O.:840] Out: 1275 [Urine:1275] Intake/Output from this shift: Total I/O In: 240 [P.O.:240] Out: 700 [Urine:700]  Physical Exam: General appearance: alert, cooperative, appears stated age, no distress and pleasant mood & affect Neck: no JVD Lungs: mild R basal rales that clear with cough.  Non-labored Heart: regularly irregular rhythm and normal S1, split S2 on occasion, 1-6/6 HSM @ apex Abdomen: soft, non-tender; bowel sounds normal; no masses,  no organomegaly Extremities: extremities normal, atraumatic, no cyanosis or edema Pulses: 2+ and symmetric Neurologic: Grossly normal  Lab Results: No results found for this basename: WBC, HGB, PLT,  in the last 72 hours  Recent Labs  06/11/12 0520 06/12/12 0555  NA 140 138  K 3.5 4.1  CL 96 94*  CO2 38* 38*  GLUCOSE 131* 137*  BUN 45* 48*  CREATININE 1.46* 1.60*    Imaging / Cardiac Studies: None Recently Tele: mostly paced rhythm  Assessment/Plan:  Principal Problem:   Systolic and diastolic CHF, acute on chronic Active Problems:   CARDIOMYOPATHY, ISCHEMIC - EF 20-30% 2D 06/02/12   Chronic systolic heart failure   COPD   CKD (chronic kidney disease) stage 3, GFR 30-59 ml/min   HLD (hyperlipidemia)   Diabetes   HTN (hypertension)   ICD (implantable cardiac defibrillator) in place   Active Cardiac Concerns Cardiomyopathy CHF Ischemic Heart Disease  Stable volume status with Cr up a bit, indicating that we have reached the "end-point" for  diuresis.    Hold PM dose of Lasix today & reduce d/c doses to 60mg  AM, 40 mg PM   Recheck BMP, proBNP at end of week.  BP & HR stable  On BID Double dose Bidil for afterload reduction, nebivolol & tolerating spironolactone (may consider converting over to ACE-I as OP)  ? Will he f/u with Dr. Wonda Cheng @ [facility]   TMC 5 d/c  DM -stable.  For ICM - on Ranexa, ASA, statin  If medically ready for d/c to [facility] today => FL2 signed.    LOS: 11 days    Dustin [name] 06/12/2012, 8:44 AM

## 2012-06-12 NOTE — Progress Notes (Signed)
Pt d/c to [facility]. Report call to [facility]. EMS sch to pick up Pt at 1400

## 2012-06-12 NOTE — Progress Notes (Signed)
CSW had multiple messages on phone v-mail from family this weekend. Spoke to pt's son in lawSmitty Cords Prevo this a.m.  He stated that family has changed their mind and now want to seek SNF placement again for short term rehab. They will continue to seek ALF placement for both patient and his wife in the future.  Family is requesting a bed at [facility] of [address]. CSW spoke to [place]- Admissions at SNF this morning- they have a semi-private room available and can accept patient today.  She will contact family regarding paperwork.  Plan d/c to SNF today if patient is medically stable.  Will need scripts for any narcotics and Fl2 signed  (on shadow chart).  Lorri Frederick. [place]  [phone]

## 2012-06-12 NOTE — Progress Notes (Signed)
[facility] Sw note: Sw met with Pt's son-in-law Smitty Cords, who stated Pt will be going to SNF today. Bruce completed Hospice revocation paperwork so that Pt will have his Medicare available to pay for SNF. Per Bruce, family is hopeful Pt will get stronger and be able to return to living with wife.  [address] [place], [facility] [phone]

## 2012-06-12 NOTE — Progress Notes (Signed)
Physical Therapy Treatment Patient Details Name: Dustin [name] MRN: [id number] DOB: [birth year] Today's Date: 06/12/2012 Time: 1016-1040 PT Time Calculation (min): 24 min  PT Assessment / Plan / Recommendation Comments on Treatment Session  Pt motivated to progress with mobility.  Plans are for pt to d/c to SNF today.      Follow Up Recommendations  SNF     Does the patient have the potential to tolerate intense rehabilitation     Barriers to Discharge        Equipment Recommendations  None recommended by PT    Recommendations for Other Services    Frequency Min 3X/week   Plan Discharge plan remains appropriate;Frequency remains appropriate    Precautions / Restrictions Restrictions Weight Bearing Restrictions: No       Mobility  Bed Mobility Bed Mobility: Supine to Sit;Sitting - Scoot to Edge of Bed Supine to Sit: 4: Min guard;HOB flat;With rails Sitting - Scoot to Edge of Bed: 5: Supervision Transfers Transfers: Sit to Stand;Stand to Sit Sit to Stand: 3: Mod assist;With upper extremity assist;From bed Stand to Sit: 4: Min assist;With armrests;To chair/3-in-1;With upper extremity assist Details for Transfer Assistance: Cues for hand placement & technique.  (A) to achieve standing, anterior translation of trunk over BOS, & controlled descent.  Cues + faciliation for tall posture.   Ambulation/Gait Ambulation/Gait Assistance: 4: Min assist Ambulation Distance (Feet): 30 Feet Assistive device: Rolling walker Ambulation/Gait Assistance Details: Cues for tall posture, body positioning inside RW, & to extend hip, knees, & trunk.   Gait Pattern: Step-through pattern;Decreased stride length;Trunk flexed;Right flexed knee in stance;Left flexed knee in stance;Shuffle Gait velocity: Decreased. General Gait Details: Increased trunk, hip, & knee flexion with increased distance.   Stairs: No Corporate treasurer: No    Exercises Total Joint Exercises Long Arc  Quad: Both;10 reps;Seated Other Exercises Other Exercises: Bil HS stretches; 3 x 15 secs     PT Goals Acute Rehab PT Goals Time For Goal Achievement: 06/12/12 Potential to Achieve Goals: Good Pt will go Supine/Side to Sit: with min assist PT Goal: Supine/Side to Sit - Progress: Met Pt will go Sit to Supine/Side: with min assist Pt will go Sit to Stand: with min assist PT Goal: Sit to Stand - Progress: Not met Pt will go Stand to Sit: with min assist PT Goal: Stand to Sit - Progress: Progressing toward goal Pt will Ambulate: 16 - 50 feet;with min assist;with least restrictive assistive device PT Goal: Ambulate - Progress: Progressing toward goal  Visit Information  Last PT Received On: 06/12/12 Assistance Needed: +2 (safety)    Subjective Data      Cognition  Cognition Arousal/Alertness: Awake/alert Behavior During Therapy: WFL for tasks assessed/performed Overall Cognitive Status: Within Functional Limits for tasks assessed Area of Impairment: Memory Memory: Decreased short-term memory    Balance     End of Session PT - End of Session Equipment Utilized During Treatment: Gait belt;Oxygen Activity Tolerance: Patient limited by fatigue Patient left: in chair;with call bell/phone within reach Nurse Communication: Mobility status     Verdell Face, [place] [phone] 06/12/2012

## 2012-06-12 NOTE — Discharge Summary (Signed)
Physician Discharge Summary  Patient ID: Dustin [name] MRN: [id number] DOB/AGE: 1926/08/26 79 y.o.  Admit date: 06/01/2012 Discharge date: 06/12/2012  Admission Diagnoses:  Acute or chronic systolic and diastolic HF  Discharge Diagnoses:  Principal Problem:   Systolic and diastolic CHF, acute on chronic Active Problems:   CARDIOMYOPATHY, ISCHEMIC - EF 20-30% 2D 06/02/12   Chronic systolic heart failure   COPD   CKD (chronic kidney disease) stage 3, GFR 30-59 ml/min   HLD (hyperlipidemia)   Diabetes   HTN (hypertension)   ICD (implantable cardiac defibrillator) in place   Discharged Condition: stable  Hospital Course:   The patient is an 79 y/o male, followed by Dr. Allyson Sabal. He has a history of CAD, s/p CABG in 1995 and redo in 2004. He has an ischemic cardiomyopathy with an EF of 25-30%, s/p AICD by Dr. Ladona Ridgel. His history is also significant for HTN, insulin dependent DM, HLD, PAD, COPD, pulmonary HTN, severe MR and bilateral ICA stenosis. He presented to [facility] on 06/01/12 with complaints of worsening shortness of breath, over the course of two days, accompanied by orthopnea, ~7lb weight gain, LEE, increased abdominal girth and chest tightness. He reports both medical and dietary compliance. He endorses chills and non-productive cough, but no fever, n/v. In the ED, he was found to have an elevated BNP of 11K. CXR showed mild pulmonary vascular congestion. He was given 40 mg IV lasix. He was admitted by [facility]. He is now on BID dosing of IV Lasix. He reports mild improvement in breathing but continues to endorse substernal chest tightness.   The patient was admitted and continued on IV lasix.  He ruled ouot for MI.  2D echo revealed and EF of  20-30% grade two diastolic dysfunction and severe pulmonary HTN with PA pressure of .  Net fluids since admission were -5.1 liters.  Lasix was increased to 60mg  IV twice daily then changed to PO and ultimately will be discharged on 60mg  in the AM and 40mg   in the PM.  BiDil was increased.  Aldactone added.  Miralax twice daily was added and resulted in large BM.  Xopenex inhaler was given for wheezing.  SCr peaked at 2.00 and stablilized at ~1.45.  The patient had resolution of orthopnea and LEE.   BNP fluctuated and did not coincide with net fluid loss.  He was followed by Hospice while here.  Anusol-HC was applied for hemorrhoid pain and helped.  Follow up has been arranged. He will be discharged to SNF in stable condition after being seen by Dr. Herbie Baltimore.  Discharge Instructions:    Hold PM dose of Lasix tonight(4/21)  BNP and BMET on Thursday, 4/24.  Send results to [facility]  Consults: Hopsice, CSW  Significant Diagnostic Studies:  2D Echo Study Conclusions  - Left ventricle: The cavity size was mildly dilated. Wall thickness was increased in a pattern of mild LVH. The estimated ejection fraction was in the range of 20% to 30%. Diffuse hypokinesis. There is severe hypokinesis to akinesis of the inferior and inferoseptal myocardium. Features are consistent with a pseudonormal left ventricular filling pattern, with concomitant abnormal relaxation and increased filling pressure (grade 2 diastolic dysfunction). Doppler parameters are consistent with both elevated ventricular end-diastolic filling pressure and elevated left atrial filling pressure. - Aortic valve: Trileaflet; mildly thickened leaflets with calcification of noncoronary cusp. Sclerosis without stenosis. Mild regurgitation. - Mitral valve: Moderately calcified annulus. Mild to moderate regurgitation. - Left atrium: The atrium was  mildly dilated. - Right ventricle: Systolic pressure was increased. - Atrial septum: No defect or patent foramen ovale was identified. - Pulmonary arteries: PA peak pressure: 66mm Hg (S). Impressions:  - The right ventricular systolic pressure was increased consistent with severe pulmonary  hypertension.   Treatments:   Discharge Exam: Blood pressure 128/59, pulse 72, temperature 97.8 F (36.6 C), temperature source Oral, resp. rate 19, height 5\' 9"  (1.753 m), weight 71.5 kg (157 lb 10.1 oz), SpO2 97.00%.   Disposition: 03-Skilled Nursing Facility      Discharge Orders   Future Appointments Provider Department Dept Phone   07/05/2012 1:30 PM Kimber Relic, MD [facility] [phone]   Future Orders Complete By Expires     Diet - low sodium heart healthy  As directed     Discharge instructions  As directed     Comments:      06/12/12:  Do not give PM dose of Lasix tonight.  He will need a BNP and BMET on Thursday this week.  Send results to [facility] and [facility].    Increase activity slowly  As directed         Medication List    STOP taking these medications       benzonatate 200 MG capsule  Commonly known as:  TESSALON     cetirizine 10 MG chewable tablet  Commonly known as:  ZYRTEC     diphenhydrAMINE 25 MG tablet  Commonly known as:  BENADRYL     guaiFENesin 600 MG 12 hr tablet  Commonly known as:  MUCINEX      TAKE these medications       albuterol (2.5 MG/3ML) 0.083% nebulizer solution  Commonly known as:  PROVENTIL  Inhale 1 unit dose vial nebulizer  4  times a day as needed for shortness of breathe.     allopurinol 300 MG tablet  Commonly known as:  ZYLOPRIM  Take 300 mg by mouth every evening.     ALPRAZolam 1 MG tablet  Commonly known as:  XANAX  Take 1 tablet (1 mg total) by mouth at bedtime.     aspirin 81 MG tablet  Take 81 mg by mouth every morning.     cholecalciferol 1000 UNITS tablet  Commonly known as:  VITAMIN D  Take 2,000 Units by mouth every evening.     furosemide 20 MG tablet  Commonly known as:  LASIX  Take 2-3 tablets (40-60 mg total) by mouth 2 (two) times daily.     glipiZIDE 5 MG tablet  Commonly known as:  GLUCOTROL  Take 5 mg by mouth every morning.     hydrocortisone 2.5 % rectal  cream  Commonly known as:  ANUSOL-HC  Place rectally every 8 (eight) hours as needed.     iron polysaccharides 150 MG capsule  Commonly known as:  NIFEREX  Take 150 mg by mouth every morning.     isosorbide-hydrALAZINE 20-37.5 MG per tablet  Commonly known as:  BIDIL  Take 2 tablets by mouth 3 (three) times daily.     morphine 20 MG/ML concentrated solution  Commonly known as:  ROXANOL  Take 5 mg by mouth every 2 (two) hours as needed. For pain     nebivolol 5 MG tablet  Commonly known as:  BYSTOLIC  Take 2.5 mg by mouth every morning. Take 1/2 (half) tab qd     nitroGLYCERIN 0.4 MG SL tablet  Commonly known as:  NITROSTAT  Place 0.4 mg  under the tongue every 5 (five) minutes x 3 doses as needed. For chest pain     omeprazole 20 MG capsule  Commonly known as:  PRILOSEC  Take 20-40 mg by mouth 2 (two) times daily. Take 2 capsules in the morning and 1 capsule in the evenings     oxybutynin 5 MG tablet  Commonly known as:  DITROPAN  Take 1 tablet (5 mg total) by mouth every 8 (eight) hours as needed.     Oxycodone HCl 10 MG Tabs  Take 1 tablet (10 mg total) by mouth 3 (three) times daily.     polyethylene glycol packet  Commonly known as:  MIRALAX / GLYCOLAX  Take 17 g by mouth daily.     pravastatin 40 MG tablet  Commonly known as:  PRAVACHOL  Take 40 mg by mouth every evening.     ranolazine 500 MG 12 hr tablet  Commonly known as:  RANEXA  Take 1 tablet (500 mg total) by mouth every morning.     senna-docusate 8.6-50 MG per tablet  Commonly known as:  Senokot-S  Take 3 tablets by mouth 2 (two) times daily.     sertraline 100 MG tablet  Commonly known as:  ZOLOFT  Take 100 mg by mouth every morning.     spironolactone 25 MG tablet  Commonly known as:  ALDACTONE  Take 1 tablet (25 mg total) by mouth daily.     XOPENEX HFA 45 MCG/ACT inhaler  Generic drug:  levalbuterol  Inhale 1-2 puffs into the lungs every 4 (four) hours as needed. For shortness of breath        Follow-up Information   Follow up with [facility], NP On 06/16/2012. (9:20 AM )    Contact information:   [address] [place] [address] [phone]       Signed: Wilburt Finlay 06/12/2012, 10:51 AM

## 2012-06-12 NOTE — Discharge Summary (Signed)
I saw Dustin [name] this AM along with [name].  He was admitted with Acute on Chronic Combined Systolic / Diastolic HF and has reached his plateau for diuresis.  We have adjusted his medication regimen -- changed Lasix dosing & added Spironolactone to his Bidil & BB regimen.  He remains quite weak, but is in good spirits, he is medically ready for d/c to [facility] as per PT recommendations.  Marykay Lex, M.D., M.S. [facility] [address] [place], [place]  [address]  [phone] Pager # [phone] 06/12/2012 11:03 AM

## 2012-07-05 ENCOUNTER — Ambulatory Visit: Payer: Self-pay | Admitting: Internal Medicine

## 2012-07-10 ENCOUNTER — Other Ambulatory Visit: Payer: Self-pay | Admitting: *Deleted

## 2012-07-10 MED ORDER — SPIRONOLACTONE 25 MG PO TABS: ORAL_TABLET | ORAL | Status: AC

## 2012-07-13 ENCOUNTER — Telehealth: Payer: Self-pay | Admitting: *Deleted

## 2012-07-13 MED ORDER — ASPIRIN 81 MG PO TABS: 81.0000 mg | ORAL_TABLET | Freq: Every morning | ORAL | Status: AC

## 2012-07-13 NOTE — Telephone Encounter (Signed)
error 

## 2012-07-14 NOTE — Telephone Encounter (Signed)
error 

## 2012-07-24 ENCOUNTER — Telehealth: Payer: Self-pay | Admitting: *Deleted

## 2012-07-24 NOTE — Telephone Encounter (Signed)
Dr. Leanord Hawking called and stated that he received a call over the weekend from Hospice regarding patient's heart Failure. Dr. Leanord Hawking stated that patient has not been seen in awhile and needs an appointment to follow up with the heart failure and his medications. Called patient and schedules an appointment for 07/26/2012 with Dr. Chilton Si.

## 2012-07-26 ENCOUNTER — Encounter: Payer: Self-pay | Admitting: *Deleted

## 2012-07-26 ENCOUNTER — Encounter: Payer: Self-pay | Admitting: Internal Medicine

## 2012-07-26 ENCOUNTER — Ambulatory Visit (INDEPENDENT_AMBULATORY_CARE_PROVIDER_SITE_OTHER): Payer: Medicare Other | Admitting: Internal Medicine

## 2012-07-26 VITALS — BP 120/68 | HR 58 | Temp 97.5°F | Resp 16 | Wt 153.0 lb

## 2012-07-26 DIAGNOSIS — I10 Essential (primary) hypertension: Secondary | ICD-10-CM

## 2012-07-26 DIAGNOSIS — R0602 Shortness of breath: Secondary | ICD-10-CM

## 2012-07-26 DIAGNOSIS — J449 Chronic obstructive pulmonary disease, unspecified: Secondary | ICD-10-CM

## 2012-07-26 DIAGNOSIS — J45909 Unspecified asthma, uncomplicated: Secondary | ICD-10-CM

## 2012-07-26 DIAGNOSIS — I2589 Other forms of chronic ischemic heart disease: Secondary | ICD-10-CM

## 2012-07-26 MED ORDER — LEVALBUTEROL TARTRATE 45 MCG/ACT IN AERO: INHALATION_SPRAY | RESPIRATORY_TRACT | Status: DC

## 2012-07-26 NOTE — Progress Notes (Signed)
Date: 07/26/2012  MRN:  [id number] Name:  Dustin [name] Sex:  male Age:  79 y.o. DOB:1926/12/05   [facility] #: [id number]                      Level Of Care: Independent. He is under hospice care. He lives at [facility] with his wife. Provider: Murray Hodgkins M.D.  Emergency Contacts: Contact Information   Name Relation Home Work [place] D [place] [phone]  [phone]   Prabhav, [place] [phone]     Prevo,Bettie Daughter   [phone]      Code Status: No CODE BLUE  Allergies: Allergies  Allergen Reactions  . Codeine     REACTION: "makes me crazy"  . Inderal (Propranolol) Other (See Comments)    unknown  . Nsaids Other (See Comments)    GI bleeds  . Sulfamethoxazole W-Trimethoprim   . Tramadol Other (See Comments)    Hallucinations  . Mirtazapine Anxiety and Other (See Comments)    Personality changes     Chief Complaint  Patient presents with  . Medical Managment of Chronic Issues     HPI: Although the patient has complaints today, he says that he has had the best week in 3 years over the last week.  Patient was hospitalized from 06/01/2012 through 06/15/2012. He has known severe cardiomyopathy with a left ventricular ejection fraction of about 20%. There is a significant amount of coronary as well as peripheral arteriosclerosis. When he left the hospital he went to the [facility]. He returned home several weeks ago. Extensive review of hospital and previous records was done today to refamiliarize ourself with his current medical conditions.  For the last 3 weeks he has had a pain when he finishes urinating. Culture is negative.   Sharp pain in the right side of neck for a year. Intermittent. Incidents last about 5-10 seconds.   Constipated for 3 weeks. Has no relief unless he uses Miralax. Uses senna 6 tabs daily.   Diuretic does not seem to help him lose weight when he gains more than 2#. Currently does 60mg  each morning  and 20 mg each evening. Previously was on 60 and then 40 which seemed more effective.  Past Medical History  Diagnosis Date  . IHD (ischemic heart disease)      -  non-ST elevation MI in July 2009. and NSTEMI Jan 2010 in setting of RLL CAP. 99% diagonal  disease s/p stent July of 2009. Cath Jan 2010 showed  occulusion of SVG to RCA   LVEF 45%. rec med rx   - Echo 10/29/08 EF 25%  . PAD (peripheral artery disease)   . Hyperlipidemia   . Hypertension   . Obstructive sleep apnea      Refuses to wear his CPAP.  Not on oxygen   . Vocal cord dysfunction     with Botox injection at [facility] around 2005  per patient.     - Patient's swallowing difficulties may be related to vocal    cord dysfunction, recommended to be followed up at [facility]  . Stroke     History of left brain cerebrovascular accident in 1998 without   . Dysphagia       -swallowing study nl oropharyngeal swallow, brief pause  at top of the esophagus which clears with liquid wash or small bites.    Rec  regular diet with thin liquids and continue management of  esophageal dysmotility.   - Barium swallow 10/31/08  mild dilation upper esosphagus, moderate slowing, can't r/o mass  . Anemia         03/21/2008 hgb 10.9, MCV 86, Stool occult blood postiive -> On IV Iron   . COPD (chronic obstructive pulmonary disease) 01/12/2012    - Hypoxemia, former smoker-  FEV1 1.86 (72%) , ratio 68% (07/26/08) - FEV1 1.61 (62%) , ratio 74    ( 10/29/08) with no insp or exp truncation on f/v loop  . [employer] (inclusion body myositis) 2011    diagnosed three years ago per patient  . Coronary artery disease   . Myocardial infarction   . Anginal pain   . Dysrhythmia   . ICD (implantable cardiac defibrillator) in place   . Shortness of breath   . Diabetes mellitus without complication   . CHF (congestive heart failure)   . GERD (gastroesophageal reflux disease)   . Cancer     leukemia  . AML (acute myeloid leukemia) in remission    . Hospice care patient   . Gout, unspecified 04/17/2006  . Anxiety 02/21/2007  . Prostatitis, unspecified 06/18/2005  . Diverticulitis of colon (without mention of hemorrhage) 06/17/2005  . Hypertrophy of prostate with urinary obstruction and other lower urinary tract symptoms (LUTS) 01/12/2012  . Personal history of fall 01/12/2012  . Pneumonitis due to inhalation of food or vomitus 12/09/2011  . Other voice and resonance disorders 10/21/2011  . Chronic kidney disease, stage III (moderate) 05/27/2010  . Sciatica 05/27/2010  . Chronic pain syndrome 05/07/2010  . Diverticulosis of colon (without mention of hemorrhage) 05/07/2010  . Pain in joint, pelvic region and thigh 03/25/2010  . Hallucinations 03/04/2010  . Abnormality of gait 03/04/2010  . Debility, unspecified 10/07/2009  . Insomnia, unspecified 07/16/2009  . Other primary cardiomyopathies 01/15/2009  . Atrial fibrillation 12/23/2008  . Long term (current) use of anticoagulants 12/23/2008  . Chronic sinusitis 10/09/2008  . Lumbago 10/09/2008  . Obstructive sleep apnea (adult) (pediatric) 09/14/2008  . Unspecified hereditary and idiopathic peripheral neuropathy 11/22/2006  . Impotence of organic origin 11/22/2006  . Dysphagia, unspecified(787.20) 11/22/2006  . Occlusion and stenosis of carotid artery without mention of cerebral infarction 06/20/2006    Past Surgical History  Procedure Laterality Date  . Coronary artery bypass graft    . Abdominal aortic aneurysm repair  09/1992    Hart Rochester, MD  . Aortobifemoral bypass graft in the past    . Icd insertion      ICD Medtronic  . Ptca  08/1991    Little MD  . Cardiac catheterization  08/2001    Little,MD     Procedures: 12/88 Colonoscopy-diverticulosis 1988 Lithotripsy 1990 Lumbar myelogram 1990 CT post myelogram 1/92 US abdomen AAA 1/92 EGD distal esophagal stricture  1/92 Carotid doppler-normal  9/92 US gallbladder-normal   4/96 Renal Ultrasound-normal 2/96  MRI-lumbar spine -DDD L5-S1 6/96 Holter -[facility] 5/98 CT abdomen -Hiatal hernia; diverticuloses; Right inguinal hernia 12/97 BM Bx-A.M.L 10/98 Cardiolite 4% LVEF 10/98 Stress tes -42% LVEF 10/98 US abdomen -normal 11/98 Cardiolite stress-abnormal 7/00 2-D echo LV dysfunction, MR 09/16/98 Hemodynamic Status -decrease C.O. with increase SVRI. early CHF. TFCVI 2000 CT brain 2001 MRI Lumbar spine 06/27/00 Epidurography; Bilateral L5-S1 Facet injection Dr. Darrelyn Hillock 07/11/00 Second Facet injection L5-S1  07/25/00 Third Facet injection L5-S1 08/17/00 MRI abdomen -left renal artery stenosis of the superior 05/02/01 CT chest-Pneumonia 04/28/04 CT chest-probably pneumonia 05/13/04 CT chest-resolving pneumonia 05/15/04 CT lumbar spine 05/27/04 CT abdomen -diverticulosis 04/27/05  Sleep Study-sleep apnea at [facility] 02/02/2007-Abdominal Ultrasound: Negative 03/03/2008-Chest X-Ray: stable enlargement of the cardiac silhouette is present. There is elevation of the right hemidiaphragm with atelectasis in the right base.  Patchy infiltrative density in the right 10/24/2008-Chest X-Ray: Cardiomegaly with minor pulmonary vascular congestion status post CABG.COPD. Small bibasilar effusions. 10/25/2008-Chest X-Ray: Stable chest. Small Pleural effusions better evident on the two view exam 10/26/2008-Chest X-Ray: tiny bilateral pleural effusions. Stable cardiomegaly and pulmonary vascular congestion. 10/28/2008-CT Chest: Emphysematous disease in the lung apices noted. There is some associated scarring. no pulmonary fibrosis. Cardiomegaly. Very small bilateral pleural effusions. Extensive atherosclerosis. Small calcified lymph nodes in the chest compatible with old granulomatous disease. 10/30/2008-Barium Swallow: At the left of C4-C5 there is posterior impression on the posterior aspect of the cervical esophagus associated with the degenerative spondylosis. 07/24/2009-2D Echocardiogram: The estimated ejection  fraction in the range of 25-30% 07/25/2009-Chest X-Ray: Persistent mild CHF 07/26/2009-Modified Barium Swallow Study: Oral and pharyngeal function appear to be WNL. However, there was consistent presence of residue just below the VES in the cervical and thoracic esophagus. It is possible that over the duration of a meal, accumulation of POs could rise through UES and spill into larynx, eliciting the coughing that the patient describes. Liquids are helpful in facilitating clearance. 07/30/2009-Chest X-Ray: Cardiomegaly with pulmonary vascular congestion.   05/07/2010-Abdomen X-Ray: Moderate stool throughout the colon. No other significant findings.  11/06/11 CT of abdomen and pelvis: Diverticulosis. Status post aortic repair. Diminutive renal arteries. Bilateral inguinal hernias with small bowel present on the right side but no obstruction. Enlarged prostate. Bladder wall thickening. Cardiomegaly.  01/25/12 CT of the abdomen and pelvis: Unchanged from September 2013  01/25/12 ultrasound scrotum: Left epididymitis. Left hydrocele.  01/27/12 CXR: Cardiomegaly, pulmonary congestion  06/02/12 2-D echo: LVEF 20%. Severe LV hypokinesia. Aortic valve sclerosis without stenosis. Aortic regurgitation, mild. Mitral valve calcifications. Severe pulmonary hypertension.  06/08/12 CXR: Prior median sternotomy. Cardiomegaly. Bilateral pleural effusions.   Consultants:  Dr. Jeral Fruit Neuro Surgeon Dr. Hart Rochester CVTS Dr. Sherin Quarry GI Dr. Sandria Manly Neurology Dr. Myra Rude Orthopedics Dr. Cleophas Dunker Orthopedics Dr. Nelle Don Ophthalmology Dr. Angela Adam Dr. Paul-Orthopedics Dr. Zachery Dakins General Surgeon Dr. Little-Cardiology Dr. Grove-Cardiology Dr. Lowell Guitar -Hematology at [facility] Dr. Gibson Ramp: Hospice  Current Outpatient Prescriptions  Medication Sig Dispense Refill  . albuterol (PROVENTIL) (2.5 MG/3ML) 0.083% nebulizer solution Inhale 1 unit dose vial nebulizer  4  times a day as needed for shortness of breathe.  30 mL  3  .  allopurinol (ZYLOPRIM) 300 MG tablet Take 300 mg by mouth every evening. Take 1 tablet daily for gout.      Marland Kitchen ALPRAZolam (XANAX) 0.5 MG tablet Take 0.5 mg by mouth at bedtime as needed for sleep. Take one tablet once a day at bedtime      . aspirin 81 MG tablet Take 1 tablet (81 mg total) by mouth every morning.  30 tablet  5  . benzonatate (TESSALON) 200 MG capsule Take 200 mg by mouth 3 (three) times daily as needed for cough. Take one tablet at bedtime      . cetirizine (ZYRTEC) 10 MG tablet Take 10 mg by mouth daily. Take one tablet once a day as needed for allergies      . cholecalciferol (VITAMIN D) 1000 UNITS tablet Take 2,000 Units by mouth every evening.      Marland Kitchen dextromethorphan-guaiFENesin (MUCINEX DM) 30-600 MG per 12 hr tablet Take 1 tablet by mouth every 12 (twelve) hours. Take one twice daily for cough  and congestion.      . dicyclomine (BENTYL) 10 MG capsule Take 10 mg by mouth 4 (four) times daily -  before meals and at bedtime. Take 1 tablet every 6 hours  for muscle spasms as needed.      . diphenhydrAMINE (BENADRYL) 25 MG tablet Take 25 mg by mouth every 6 (six) hours as needed for itching. Take one tablet at bedtime      . doxycycline (DORYX) 100 MG EC tablet Take 100 mg by mouth 2 (two) times daily.      . furosemide (LASIX) 20 MG tablet Take 2-3 tablets (40-60 mg total) by mouth 2 (two) times daily.  90 tablet  5  . glipiZIDE (GLUCOTROL) 5 MG tablet Take 5 mg by mouth every morning. Take one tablet in the morning regardless of blood sugar.      . hydrocortisone (ANUSOL-HC) 2.5 % rectal cream Place rectally every 8 (eight) hours as needed.  30 g  5  . iron polysaccharides (NIFEREX) 150 MG capsule Take 150 mg by mouth every morning.       . isosorbide-hydrALAZINE (BIDIL) 20-37.5 MG per tablet Take 2 tablets by mouth 3 (three) times daily.  180 tablet  5  . levalbuterol (XOPENEX HFA) 45 MCG/ACT inhaler Inhale 2 puffs into the lungs 2 (two) times daily. For shortness of breath      .  memantine (NAMENDA) 10 MG tablet Take 10 mg by mouth 2 (two) times daily. Take one twice daily to help preserve memory.      . metolazone (ZAROXOLYN) 5 MG tablet Take 5 mg by mouth daily. Take one tablet if patient gains 2 pounds      . morphine (ROXANOL) 20 MG/ML concentrated solution Take 5 mg by mouth every 2 (two) hours as needed. For pain      . nebivolol (BYSTOLIC) 5 MG tablet Take 2.5 mg by mouth every morning. Take 1/2 (half) tab qd      . nitroGLYCERIN (NITROSTAT) 0.4 MG SL tablet Place 0.4 mg under the tongue every 5 (five) minutes x 3 doses as needed. For chest pain      . omeprazole (PRILOSEC) 20 MG capsule Take 20-40 mg by mouth 2 (two) times daily. Take 2 capsules in the morning and 1 capsule in the evenings for reflux.      . ondansetron (ZOFRAN) 4 MG tablet Take 4 mg by mouth every 8 (eight) hours as needed for nausea. Take one tablet every 6 hours as needed for nausea      . oxybutynin (DITROPAN) 5 MG tablet Take 1 tablet (5 mg total) by mouth every 8 (eight) hours as needed.  45 tablet  0  . oxyCODONE 10 MG TABS Take 1 tablet (10 mg total) by mouth 3 (three) times daily.  45 tablet  0  . OXYGEN-HELIUM IN Inhale into the lungs. Use 2-4 liters as needed      . PE-Diphenhydramine-DM-GG-APAP (MUCINEX FAST-MAX DAY/NIGHT PO) Take by mouth. Take 1 to 2 tablets as needed for  cough and congestion      . polyethylene glycol (MIRALAX / GLYCOLAX) packet Take 17 g by mouth daily.  14 each  5  . pravastatin (PRAVACHOL) 40 MG tablet Take 40 mg by mouth every evening.       . ranolazine (RANEXA) 500 MG 12 hr tablet Take 1 tablet (500 mg total) by mouth every morning.  30 tablet  5  . senna-docusate (SENOKOT-S) 8.6-50 MG per tablet Take 3  tablets by mouth 2 (two) times daily. Take daily for bowels.      . sertraline (ZOLOFT) 100 MG tablet Take 100 mg by mouth every morning. Take 1 tablet orally daily to help nerves.      Marland Kitchen spironolactone (ALDACTONE) 25 MG tablet Take one tablet once a day for edema   30 tablet  5   No current facility-administered medications for this visit.    Immunization History  Administered Date(s) Administered  . PPD Test 06/09/2012     Diet: Regular reduced sodium  History  Substance Use Topics  . Smoking status: Former Smoker -- 0.50 packs/day for 15 years    Types: Cigarettes    Quit date: 02/23/1980  . Smokeless tobacco: Current User    Types: Chew  . Alcohol Use: Yes    Family History  Problem Relation Age of Onset  . Alzheimer's disease Sister   . Alzheimer's disease Mother   . Heart disease Mother     Review of Systems  Constitutional: positive for malaise and weight loss, negative for anorexia, chills and night sweats Eyes: positive for contacts/glasses Ears, nose, mouth, throat, and face: Persistent dysphonia and hoarseness Respiratory: positive for dyspnea on exertion, negative for cough and wheezing Cardiovascular: positive for claudication, fatigue, irregular heart beat and lower extremity edema, negative for chest pain, chest pressure/discomfort, near-syncope, orthopnea and paroxysmal nocturnal dyspnea, Known severe cardiomyopathy and coronary arteriosclerosis with other calcifications. Gastrointestinal: positive for constipation, negative for diarrhea, nausea, odynophagia and reflux symptoms Genitourinary:Incontinence issues Integument/breast: negative Hematologic/lymphatic: Previous history of acute myelocytic leukemia, currently in remission Musculoskeletal:positive for back pain, muscle weakness, myalgias and stiff joints, negative for bone pain Neurological: positive for coordination problems, gait problems, memory problems, speech problems, tremors and weakness, negative for dizziness Behavioral/Psych: positive for anxiety Endocrine: negative Allergic/Immunologic: negative  Vital signs: BP 120/68  Pulse 58  Temp(Src) 97.5 F (36.4 C)  Resp 16  Wt 153 lb (69.4 kg)  BMI 22.58 kg/m2  General Appearance:    Alert,  cooperative, no distress, appears stated age  Head:    Normocephalic, without obvious abnormality, atraumatic  Eyes:    PERRL, conjunctiva/corneas clear, EOM's intact, fundi    benign, both eyes       Ears:    Normal TM's and external ear canals, both ears  Nose:   Nares normal, septum midline, mucosa normal, no drainage   or sinus tenderness  Throat:   Lips, mucosa, and tongue normal; teeth and gums normal  Neck:   Supple, symmetrical, trachea midline, no adenopathy;       thyroid:  No enlargement/tenderness/nodules; no carotid   bruit or JVD  Back:     Symmetric, no curvature, ROM normal, no CVA tenderness  Lungs:   Bilateral rales. No dyspnea at rest   Chest wall:   non tender  Heart:    Regular rate and rhythm, S1 and S2 normal, 2/6 aortic ejection murmur   Abdomen:     Soft, non-tender, bowel sounds active all four quadrants,    no masses, no organomegaly. Bilateral inguinal hernia  Genitalia:    Left hydrocele. Soft nodule in the lower right pole of the diffusely enlarged prostate  Rectal:    Normal tone,no masses or tenderness;  guaiac negative stool  Extremities:   Extremities normal, atraumatic, no cyanosis or edema  Pulses:   diminished in feet  Skin:   Skin color, texture, turgor normal, no rashes or lesions  Lymph nodes:   Cervical, supraclavicular, and  axillary nodes normal  Neurologic:   CNII-XII intact. Generalized weakness. Diminished sensation in feet.  Ataxic. Problems with voice modulation. Fine tremor. Titubation.     Screening Score  MMS    PHQ2 0  PHQ9     Fall Risk High Fall Risk  BIMS    Lab reports 05/07/2010 Amylase 52 CBC normal CMP: glucose 135, BUN 53, Creatinine 1.78, SGOT 42 Lipase 33 Urine negative 05/26/2010 CBC normal CMP: glucose 142, BUN 66, Creatinine 2.04, Potassium 3.4, SGOT 42 08/31/2010 BMP Glucose 105 Bun 46 Creatinine 1.58  HA1C 7.0    01/06/2012  CBC: wbc 10.4, rbc 3.74, Hemoglobin 11.4 CMP: glucose 119, BUN 33, Creatinine  1.48 Lipid: Cholesterol 168, Triglycerides 128, HDL 45, LDL 97 Urine Microalbumin  33.9 No visits with results within 1 Month(s) from this visit. Latest known visit with results is:  Admission on 06/01/2012, Discharged on 06/12/2012  No results displayed because visit has over 200 results.       Annual summary: Hospitalizations: 06/01/12-06/15/12 acute on chronic systolic and diastolic heart failure. ADL: independent in all Rehab potential: none Survival potential: poor, Hospice client  Plan: 1. ASTHMA Currently under good control  2. CARDIOMYOPATHY, ISCHEMIC - EF 20-30% 2D 06/02/12 Poor prognosis  3. COPD Currently under good control - levalbuterol (XOPENEX HFA) 45 MCG/ACT inhaler; 2 puffs every 12 hours as needed to help breathing  Dispense: 1 Inhaler; Refill: 12  4. HTN (hypertension) Controlled  5. SHORTNESS OF BREATH (SOB) Caused by both pulmonary and cardiac factors  6. Discomfort at the end of voiding Etiology unknown. Unlikely to have prostatitis. No evidence at this time of epididymitis. I discussed the possibility of urologic referral or use of bladder anesthetic such as perineum, but he prefers not he engage in either of these at this time.  7. Left hip discomfort  An injection by Dr. Ethelene Hal about 3 weeks ago which immediately helped quite a bit. The pains are now returning. Advised him that he should call Dr. Ethelene Hal again to see if he would be a candidate for another injection or some other alternative orthopedic procedure.  8. Neck pain: The transient nature and brief symptomatic. Suggested that the most likely etiology is a facet arthritis with possible muscular spasm. He could have transient nerve compression, but pains don't seem to race down the arm. Patient was instructed in neck muscle exercises today.

## 2012-07-26 NOTE — Patient Instructions (Addendum)
Use both senna tabs and Miralax daily to relieve constipation.  If weight is 153# or greater,  add extra 20 mg Lasix in the afternoon.  I discussed referral to urologist or use of a bladder anesthetic (pyridium). Call if you decide you want to proceed with either of these options.

## 2012-07-27 ENCOUNTER — Encounter: Payer: Self-pay | Admitting: *Deleted

## 2012-07-28 ENCOUNTER — Other Ambulatory Visit: Payer: Self-pay | Admitting: *Deleted

## 2012-07-28 MED ORDER — NEBIVOLOL HCL 5 MG PO TABS: ORAL_TABLET | ORAL | Status: AC

## 2012-08-09 ENCOUNTER — Encounter: Payer: Self-pay | Admitting: Internal Medicine

## 2012-08-10 ENCOUNTER — Emergency Department (HOSPITAL_COMMUNITY)
Admission: EM | Admit: 2012-08-10 | Discharge: 2012-08-11 | Discharge status: Against Medical Advice | Attending: Emergency Medicine | Admitting: Emergency Medicine

## 2012-08-10 ENCOUNTER — Encounter (HOSPITAL_COMMUNITY): Payer: Self-pay

## 2012-08-10 DIAGNOSIS — Z8659 Personal history of other mental and behavioral disorders: Secondary | ICD-10-CM | POA: Insufficient documentation

## 2012-08-10 DIAGNOSIS — I209 Angina pectoris, unspecified: Secondary | ICD-10-CM | POA: Insufficient documentation

## 2012-08-10 DIAGNOSIS — Z8639 Personal history of other endocrine, nutritional and metabolic disease: Secondary | ICD-10-CM | POA: Insufficient documentation

## 2012-08-10 DIAGNOSIS — R450 Nervousness: Secondary | ICD-10-CM | POA: Insufficient documentation

## 2012-08-10 DIAGNOSIS — Z856 Personal history of leukemia: Secondary | ICD-10-CM | POA: Insufficient documentation

## 2012-08-10 DIAGNOSIS — M109 Gout, unspecified: Secondary | ICD-10-CM | POA: Insufficient documentation

## 2012-08-10 DIAGNOSIS — G609 Hereditary and idiopathic neuropathy, unspecified: Secondary | ICD-10-CM | POA: Insufficient documentation

## 2012-08-10 DIAGNOSIS — G4700 Insomnia, unspecified: Secondary | ICD-10-CM | POA: Insufficient documentation

## 2012-08-10 DIAGNOSIS — I129 Hypertensive chronic kidney disease with stage 1 through stage 4 chronic kidney disease, or unspecified chronic kidney disease: Secondary | ICD-10-CM | POA: Insufficient documentation

## 2012-08-10 DIAGNOSIS — K559 Vascular disorder of intestine, unspecified: Secondary | ICD-10-CM | POA: Insufficient documentation

## 2012-08-10 DIAGNOSIS — Z79899 Other long term (current) drug therapy: Secondary | ICD-10-CM | POA: Insufficient documentation

## 2012-08-10 DIAGNOSIS — E785 Hyperlipidemia, unspecified: Secondary | ICD-10-CM | POA: Insufficient documentation

## 2012-08-10 DIAGNOSIS — Z8679 Personal history of other diseases of the circulatory system: Secondary | ICD-10-CM | POA: Insufficient documentation

## 2012-08-10 DIAGNOSIS — Z954 Presence of other heart-valve replacement: Secondary | ICD-10-CM | POA: Insufficient documentation

## 2012-08-10 DIAGNOSIS — Z8669 Personal history of other diseases of the nervous system and sense organs: Secondary | ICD-10-CM | POA: Insufficient documentation

## 2012-08-10 DIAGNOSIS — Z515 Encounter for palliative care: Secondary | ICD-10-CM | POA: Insufficient documentation

## 2012-08-10 DIAGNOSIS — N183 Chronic kidney disease, stage 3 unspecified: Secondary | ICD-10-CM | POA: Insufficient documentation

## 2012-08-10 DIAGNOSIS — R109 Unspecified abdominal pain: Secondary | ICD-10-CM

## 2012-08-10 DIAGNOSIS — Z87448 Personal history of other diseases of urinary system: Secondary | ICD-10-CM | POA: Insufficient documentation

## 2012-08-10 DIAGNOSIS — Z862 Personal history of diseases of the blood and blood-forming organs and certain disorders involving the immune mechanism: Secondary | ICD-10-CM | POA: Insufficient documentation

## 2012-08-10 DIAGNOSIS — Z8739 Personal history of other diseases of the musculoskeletal system and connective tissue: Secondary | ICD-10-CM | POA: Insufficient documentation

## 2012-08-10 DIAGNOSIS — G4733 Obstructive sleep apnea (adult) (pediatric): Secondary | ICD-10-CM | POA: Insufficient documentation

## 2012-08-10 DIAGNOSIS — K5900 Constipation, unspecified: Secondary | ICD-10-CM | POA: Insufficient documentation

## 2012-08-10 DIAGNOSIS — I208 Other forms of angina pectoris: Secondary | ICD-10-CM

## 2012-08-10 DIAGNOSIS — I4891 Unspecified atrial fibrillation: Secondary | ICD-10-CM | POA: Insufficient documentation

## 2012-08-10 DIAGNOSIS — I2089 Other forms of angina pectoris: Secondary | ICD-10-CM

## 2012-08-10 DIAGNOSIS — I2510 Atherosclerotic heart disease of native coronary artery without angina pectoris: Secondary | ICD-10-CM | POA: Insufficient documentation

## 2012-08-10 DIAGNOSIS — Z9861 Coronary angioplasty status: Secondary | ICD-10-CM | POA: Insufficient documentation

## 2012-08-10 DIAGNOSIS — Z8673 Personal history of transient ischemic attack (TIA), and cerebral infarction without residual deficits: Secondary | ICD-10-CM | POA: Insufficient documentation

## 2012-08-10 DIAGNOSIS — Z9889 Other specified postprocedural states: Secondary | ICD-10-CM | POA: Insufficient documentation

## 2012-08-10 DIAGNOSIS — Z7901 Long term (current) use of anticoagulants: Secondary | ICD-10-CM | POA: Insufficient documentation

## 2012-08-10 DIAGNOSIS — G894 Chronic pain syndrome: Secondary | ICD-10-CM | POA: Insufficient documentation

## 2012-08-10 DIAGNOSIS — Z95810 Presence of automatic (implantable) cardiac defibrillator: Secondary | ICD-10-CM | POA: Insufficient documentation

## 2012-08-10 DIAGNOSIS — Z8709 Personal history of other diseases of the respiratory system: Secondary | ICD-10-CM | POA: Insufficient documentation

## 2012-08-10 DIAGNOSIS — K551 Chronic vascular disorders of intestine: Secondary | ICD-10-CM

## 2012-08-10 DIAGNOSIS — Z8719 Personal history of other diseases of the digestive system: Secondary | ICD-10-CM | POA: Insufficient documentation

## 2012-08-10 DIAGNOSIS — D649 Anemia, unspecified: Secondary | ICD-10-CM | POA: Insufficient documentation

## 2012-08-10 DIAGNOSIS — Z9181 History of falling: Secondary | ICD-10-CM | POA: Insufficient documentation

## 2012-08-10 DIAGNOSIS — Z87891 Personal history of nicotine dependence: Secondary | ICD-10-CM | POA: Insufficient documentation

## 2012-08-10 DIAGNOSIS — F411 Generalized anxiety disorder: Secondary | ICD-10-CM | POA: Insufficient documentation

## 2012-08-10 DIAGNOSIS — R079 Chest pain, unspecified: Secondary | ICD-10-CM | POA: Insufficient documentation

## 2012-08-10 LAB — CBC WITH DIFFERENTIAL/PLATELET
Basophils Absolute: 0 10*3/uL (ref 0.0–0.1)
Basophils Relative: 0 % (ref 0–1)
Lymphocytes Relative: 32 % (ref 12–46)
MCHC: 34.1 g/dL (ref 30.0–36.0)
Neutro Abs: 4.5 10*3/uL (ref 1.7–7.7)
Neutrophils Relative %: 60 % (ref 43–77)
Platelets: 172 10*3/uL (ref 150–400)
RDW: 16.4 % — ABNORMAL HIGH (ref 11.5–15.5)
WBC: 7.5 10*3/uL (ref 4.0–10.5)

## 2012-08-10 LAB — COMPREHENSIVE METABOLIC PANEL
ALT: 20 U/L (ref 0–53)
AST: 26 U/L (ref 0–37)
Albumin: 3.9 g/dL (ref 3.5–5.2)
CO2: 26 mEq/L (ref 19–32)
Chloride: 98 mEq/L (ref 96–112)
Creatinine, Ser: 2.07 mg/dL — ABNORMAL HIGH (ref 0.50–1.35)
GFR calc non Af Amer: 28 mL/min — ABNORMAL LOW (ref >90)
Sodium: 138 mEq/L (ref 135–145)
Total Bilirubin: 0.3 mg/dL (ref 0.3–1.2)

## 2012-08-10 LAB — POCT I-STAT, CHEM 8
BUN: 66 mg/dL — ABNORMAL HIGH (ref 6–23)
Creatinine, Ser: 2.3 mg/dL — ABNORMAL HIGH (ref 0.50–1.35)
Hemoglobin: 12.6 g/dL — ABNORMAL LOW (ref 13.0–17.0)
Potassium: 3.8 mEq/L (ref 3.5–5.1)
Sodium: 139 mEq/L (ref 135–145)

## 2012-08-10 LAB — TROPONIN I: Troponin I: <0.3 ng/mL (ref <0.30)

## 2012-08-10 LAB — CG4 I-STAT (LACTIC ACID): Lactic Acid, Venous: 2.5 mmol/L — ABNORMAL HIGH (ref 0.5–2.2)

## 2012-08-10 MED ORDER — ONDANSETRON HCL 4 MG/2ML IJ SOLN
4.0000 mg | Freq: Once | INTRAMUSCULAR | Status: AC
  Administered 2012-08-10: 4 mg via INTRAVENOUS
  Filled 2012-08-10: qty 2

## 2012-08-10 MED ORDER — IOHEXOL 300 MG/ML  SOLN
20.0000 mL | INTRAMUSCULAR | Status: AC
  Administered 2012-08-10: 20 mL via ORAL

## 2012-08-10 MED ORDER — MORPHINE SULFATE 4 MG/ML IJ SOLN
4.0000 mg | Freq: Once | INTRAMUSCULAR | Status: AC
  Administered 2012-08-10: 4 mg via INTRAVENOUS
  Filled 2012-08-10: qty 1

## 2012-08-10 NOTE — ED Notes (Signed)
Phlebotomy called to request for them to come draw labs.

## 2012-08-10 NOTE — ED Notes (Signed)
Patient presents to ED via [facility]. EMS. Pt c/o chest pressure that started today around 3pm. Pt states that he was trying to take a nap and his defibrillator fired at approx. 3:45pm today. Pt took 3 nitro prior to EMS arrival and put on his home O2 @ 4 L. EMS gave pt additional 2 nitro and 4 baby ASA, leaving his O2 [place] on @ 4L. Pt denies any chest pressure at this time. Pt also c/o abdominal pain and cramping since eating soup around 4pm. Pt A&O x4. Very tearful and anxious upon arrival to ED.

## 2012-08-10 NOTE — ED Notes (Signed)
EKG completed and given to Dr. Blinda Leatherwood along with OLD ekg.

## 2012-08-10 NOTE — ED Provider Notes (Signed)
History     CSN: [id number]  Arrival date & time 08/10/12  2038   First MD Initiated Contact with Patient 08/10/12 2048      No chief complaint on file.   (Consider location/radiation/quality/duration/timing/severity/associated sxs/prior treatment) HPI Comments: Patient comes to the ER by ambulance for evaluation of chest pain and abdominal pain. Patient reports that he has been expressing chest pain throughout the course of today. Patient reports that he was taking a nap this afternoon and his defibrillator went off. He has continued to have left-sided chest pain with shortness of breath since then. Patient denies nausea and diaphoresis.  Patient also complaining of abdominal pain. Patient reports that he eats soup around 4:30 PM. Since then he has had progressively worsening pain in the low abdomen. Pain is sharp and stabbing, not severe. He has had constipation, no diarrhea.   Past Medical History  Diagnosis Date  . IHD (ischemic heart disease)      -  non-ST elevation MI in July 2009. and NSTEMI Jan 2010 in setting of RLL CAP. 99% diagonal  disease s/p stent July of 2009. Cath Jan 2010 showed  occulusion of SVG to RCA   LVEF 45%. rec med rx   - Echo 10/29/08 EF 25%  . PAD (peripheral artery disease)   . Hyperlipidemia   . Hypertension   . Obstructive sleep apnea      Refuses to wear his CPAP.  Not on oxygen   . Vocal cord dysfunction     with Botox injection at [facility] around 2005  per patient.     - Patient's swallowing difficulties may be related to vocal    cord dysfunction, recommended to be followed up at [facility]  . Stroke     History of left brain cerebrovascular accident in 1998 without   . Dysphagia       -swallowing study nl oropharyngeal swallow, brief pause  at top of the esophagus which clears with liquid wash or small bites.    Rec  regular diet with thin liquids and continue management of    esophageal dysmotility.   - Barium swallow  10/31/08  mild dilation upper esosphagus, moderate slowing, can't r/o mass  . Anemia         03/21/2008 hgb 10.9, MCV 86, Stool occult blood postiive -> On IV Iron   . COPD (chronic obstructive pulmonary disease) 01/12/2012    - Hypoxemia, former smoker-  FEV1 1.86 (72%) , ratio 68% (07/26/08) - FEV1 1.61 (62%) , ratio 74    ( 10/29/08) with no insp or exp truncation on f/v loop  . IBM (inclusion body myositis) 2011    diagnosed three years ago per patient  . Coronary artery disease   . Myocardial infarction   . Anginal pain   . Dysrhythmia   . ICD (implantable cardiac defibrillator) in place   . Shortness of breath   . Diabetes mellitus without complication   . CHF (congestive heart failure)   . GERD (gastroesophageal reflux disease)   . Cancer     leukemia  . AML (acute myeloid leukemia) in remission   . Hospice care patient   . Gout, unspecified 04/17/2006  . Anxiety 02/21/2007  . Prostatitis, unspecified 06/18/2005  . Diverticulitis of colon (without mention of hemorrhage) 06/17/2005  . Hypertrophy of prostate with urinary obstruction and other lower urinary tract symptoms (LUTS) 01/12/2012  . Personal history of fall 01/12/2012  . Pneumonitis due  to inhalation of food or vomitus 12/09/2011  . Other voice and resonance disorders 10/21/2011  . Chronic kidney disease, stage III (moderate) 05/27/2010  . Sciatica 05/27/2010  . Chronic pain syndrome 05/07/2010  . Diverticulosis of colon (without mention of hemorrhage) 05/07/2010  . Pain in joint, pelvic region and thigh 03/25/2010  . Hallucinations 03/04/2010  . Abnormality of gait 03/04/2010  . Debility, unspecified 10/07/2009  . Insomnia, unspecified 07/16/2009  . Other primary cardiomyopathies 01/15/2009  . Atrial fibrillation 12/23/2008  . Long term (current) use of anticoagulants 12/23/2008  . Chronic sinusitis 10/09/2008  . Lumbago 10/09/2008  . Obstructive sleep apnea (adult) (pediatric) 09/14/2008  . Unspecified hereditary  and idiopathic peripheral neuropathy 11/22/2006  . Impotence of organic origin 11/22/2006  . Dysphagia, unspecified(787.20) 11/22/2006  . Occlusion and stenosis of carotid artery without mention of cerebral infarction 06/20/2006    Past Surgical History  Procedure Laterality Date  . Coronary artery bypass graft    . Abdominal aortic aneurysm repair  09/1992    Hart Rochester, MD  . Aortobifemoral bypass graft in the past    . Icd insertion      ICD [employer]  . Ptca  08/1991    Little MD  . Cardiac catheterization  08/2001    Little,MD    Family History  Problem Relation Age of Onset  . Alzheimer's disease Sister   . Alzheimer's disease Mother   . Heart disease Mother     History  Substance Use Topics  . Smoking status: Former Smoker -- 0.50 packs/day for 15 years    Types: Cigarettes    Quit date: 02/23/1980  . Smokeless tobacco: Current User    Types: Chew  . Alcohol Use: Yes      Review of Systems  Respiratory: Positive for shortness of breath.   Cardiovascular: Positive for chest pain.  Gastrointestinal: Positive for abdominal pain and constipation.  Psychiatric/Behavioral: The patient is nervous/anxious.   All other systems reviewed and are negative.    Allergies  Codeine; Inderal; Nsaids; Sulfamethoxazole w-trimethoprim; Tramadol; and Mirtazapine  Home Medications   Current Outpatient Rx  Name  Route  Sig  Dispense  Refill  . albuterol (PROVENTIL) (2.5 MG/3ML) 0.083% nebulizer solution      Inhale 1 unit dose vial nebulizer  4  times a day as needed for shortness of breathe.   30 mL   3   . allopurinol (ZYLOPRIM) 300 MG tablet   Oral   Take 300 mg by mouth every evening. Take 1 tablet daily for gout.         Marland Kitchen ALPRAZolam (XANAX) 0.5 MG tablet   Oral   Take 0.5 mg by mouth at bedtime as needed for sleep. Take one tablet once a day at bedtime         . aspirin 81 MG tablet   Oral   Take 1 tablet (81 mg total) by mouth every morning.   30  tablet   5   . benzonatate (TESSALON) 200 MG capsule   Oral   Take 200 mg by mouth 3 (three) times daily as needed for cough. Take one tablet at bedtime         . cetirizine (ZYRTEC) 10 MG tablet   Oral   Take 10 mg by mouth daily. Take one tablet once a day as needed for allergies         . cholecalciferol (VITAMIN D) 1000 UNITS tablet   Oral   Take 2,000 Units by  mouth every evening.         Marland Kitchen dextromethorphan-guaiFENesin (MUCINEX DM) 30-600 MG per 12 hr tablet   Oral   Take 1 tablet by mouth every 12 (twelve) hours. Take one twice daily for cough and congestion.         . dicyclomine (BENTYL) 10 MG capsule   Oral   Take 10 mg by mouth 4 (four) times daily -  before meals and at bedtime. Take 1 tablet every 6 hours  for muscle spasms as needed.         . diphenhydrAMINE (BENADRYL) 25 MG tablet   Oral   Take 25 mg by mouth every 6 (six) hours as needed for itching. Take one tablet at bedtime         . doxycycline (DORYX) 100 MG EC tablet   Oral   Take 100 mg by mouth 2 (two) times daily.         . furosemide (LASIX) 20 MG tablet   Oral   Take 2-3 tablets (40-60 mg total) by mouth 2 (two) times daily.   90 tablet   5     60 mg QAM, 40 mg QPM   . glipiZIDE (GLUCOTROL) 5 MG tablet   Oral   Take 5 mg by mouth every morning. Take one tablet in the morning regardless of blood sugar.         . hydrocortisone (ANUSOL-HC) 2.5 % rectal cream   Rectal   Place rectally every 8 (eight) hours as needed.   30 g   5   . iron polysaccharides (NIFEREX) 150 MG capsule   Oral   Take 150 mg by mouth every morning.          . isosorbide-hydrALAZINE (BIDIL) 20-37.5 MG per tablet   Oral   Take 2 tablets by mouth 3 (three) times daily.   180 tablet   5   . levalbuterol (XOPENEX HFA) 45 MCG/ACT inhaler   Inhalation   Inhale 2 puffs into the lungs 2 (two) times daily. For shortness of breath         . levalbuterol (XOPENEX HFA) 45 MCG/ACT inhaler      2  puffs every 12 hours as needed to help breathing   1 Inhaler   12   . memantine (NAMENDA) 10 MG tablet   Oral   Take 10 mg by mouth 2 (two) times daily. Take one twice daily to help preserve memory.         . metolazone (ZAROXOLYN) 5 MG tablet   Oral   Take 5 mg by mouth daily. Take one tablet if patient gains 2 pounds         . morphine (ROXANOL) 20 MG/ML concentrated solution   Oral   Take 5 mg by mouth every 2 (two) hours as needed. For pain         . nebivolol (BYSTOLIC) 5 MG tablet      Take 1/2 tablet by mouth once daily for blood pressure   30 tablet   6   . nitroGLYCERIN (NITROSTAT) 0.4 MG SL tablet   Sublingual   Place 0.4 mg under the tongue every 5 (five) minutes x 3 doses as needed. For chest pain         . omeprazole (PRILOSEC) 20 MG capsule   Oral   Take 20-40 mg by mouth 2 (two) times daily. Take 2 capsules in the morning and 1 capsule in the evenings for reflux.         Marland Kitchen  ondansetron (ZOFRAN) 4 MG tablet   Oral   Take 4 mg by mouth every 8 (eight) hours as needed for nausea. Take one tablet every 6 hours as needed for nausea         . oxybutynin (DITROPAN) 5 MG tablet   Oral   Take 1 tablet (5 mg total) by mouth every 8 (eight) hours as needed.   45 tablet   0   . oxyCODONE 10 MG TABS   Oral   Take 1 tablet (10 mg total) by mouth 3 (three) times daily.   45 tablet   0   . OXYGEN-HELIUM IN   Inhalation   Inhale into the lungs. Use 2-4 liters as needed         . PE-Diphenhydramine-DM-GG-APAP (MUCINEX FAST-MAX DAY/NIGHT PO)   Oral   Take by mouth. Take 1 to 2 tablets as needed for  cough and congestion         . polyethylene glycol (MIRALAX / GLYCOLAX) packet   Oral   Take 17 g by mouth daily.   14 each   5   . pravastatin (PRAVACHOL) 40 MG tablet   Oral   Take 40 mg by mouth every evening.          . ranolazine (RANEXA) 500 MG 12 hr tablet   Oral   Take 1 tablet (500 mg total) by mouth every morning.   30 tablet   5    . senna-docusate (SENOKOT-S) 8.6-50 MG per tablet   Oral   Take 3 tablets by mouth 2 (two) times daily. Take daily for bowels.         . sertraline (ZOLOFT) 100 MG tablet   Oral   Take 100 mg by mouth every morning. Take 1 tablet orally daily to help nerves.         Marland Kitchen spironolactone (ALDACTONE) 25 MG tablet      Take one tablet once a day for edema   30 tablet   5     There were no vitals taken for this visit.  Physical Exam  Constitutional: He is oriented to person, place, and time. He appears well-developed and well-nourished. He appears distressed.  HENT:  Head: Normocephalic and atraumatic.  Right Ear: Hearing normal.  Left Ear: Hearing normal.  Nose: Nose normal.  Mouth/Throat: Oropharynx is clear and moist and mucous membranes are normal.  Eyes: Conjunctivae and EOM are normal. Pupils are equal, round, and reactive to light.  Neck: Normal range of motion. Neck supple.  Cardiovascular: Regular rhythm, S1 normal and S2 normal.  Exam reveals no gallop and no friction rub.   No murmur heard. Pulmonary/Chest: Effort normal and breath sounds normal. No respiratory distress. He exhibits no tenderness.  Abdominal: Soft. Normal appearance and bowel sounds are normal. There is no hepatosplenomegaly. There is tenderness in the right lower quadrant, suprapubic area and left lower quadrant. There is no rebound, no guarding, no tenderness at McBurney's point and negative Murphy's sign. No hernia.  Musculoskeletal: Normal range of motion.  Neurological: He is alert and oriented to person, place, and time. He has normal strength. No cranial nerve deficit or sensory deficit. Coordination normal. GCS eye subscore is 4. GCS verbal subscore is 5. GCS motor subscore is 6.  Skin: Skin is warm, dry and intact. No rash noted. No cyanosis.  Psychiatric: His speech is normal and behavior is normal. Thought content normal. His mood appears anxious.    ED Course  Procedures (including  critical  care time)  EKG:  Date: 08/10/2012  Rate: 67  Rhythm: atrial sensed v-pacing No furthe analysis     Labs Reviewed  CBC WITH DIFFERENTIAL - Abnormal; Notable for the following:    RBC 3.90 (*)    Hemoglobin 12.1 (*)    HCT 35.5 (*)    RDW 16.4 (*)    All other components within normal limits  COMPREHENSIVE METABOLIC PANEL - Abnormal; Notable for the following:    Glucose, Bld 127 (*)    BUN 70 (*)    Creatinine, Ser 2.07 (*)    GFR calc non Af Amer 28 (*)    GFR calc Af Amer 32 (*)    All other components within normal limits  PRO B NATRIURETIC PEPTIDE - Abnormal; Notable for the following:    Pro B Natriuretic peptide (BNP) 2992.0 (*)    All other components within normal limits  CG4 I-STAT (LACTIC ACID) - Abnormal; Notable for the following:    Lactic Acid, Venous 2.50 (*)    All other components within normal limits  POCT I-STAT, CHEM 8 - Abnormal; Notable for the following:    BUN 66 (*)    Creatinine, Ser 2.30 (*)    Glucose, Bld 123 (*)    Hemoglobin 12.6 (*)    HCT 37.0 (*)    All other components within normal limits  TROPONIN I  LIPASE, BLOOD  URINALYSIS, ROUTINE W REFLEX MICROSCOPIC   No results found.   No diagnosis found.    MDM  Patient comes to you for evaluation of chest pain and abdominal pain. The patient reports that he has been experiencing pain in the left chest the course of the afternoon. History of present illness lying down on his bed trying to take a nap and he felt a shock in the chest, thinks that his defibrillator went off. After that he started to have pain in the lower abdomen and pelvic region. This pain has progressively worsened and has become severe.Patient's EKG shows atrial sensed ventricular pacing. Troponin was normal. [employer] was contacted and they have interrogated his defibrillator. Patient has not had any arrhythmia and there have not been any shocks delivered.  CBC and comprehensive metabolic panel were normal except for  renal insufficiency which is chronic for the patient.  CT scan of abdomen and pelvis ordered to further evaluate the abdominal pain. Diagnosis would be colitis/diverticulitis, bowel obstruction and kidney stone. Case signed out to Dr. Lavella Lemons to follow up CT.   PCP: Murray Hodgkins, [facility]     Gilda Crease, MD 08/10/12 [phone]

## 2012-08-10 NOTE — ED Notes (Signed)
Thayer Ohm, charge nurse currently at bedside to interrogate pacemaker.

## 2012-08-11 ENCOUNTER — Encounter (HOSPITAL_COMMUNITY): Payer: Self-pay | Admitting: Radiology

## 2012-08-11 ENCOUNTER — Emergency Department (HOSPITAL_COMMUNITY)

## 2012-08-11 LAB — URINALYSIS, ROUTINE W REFLEX MICROSCOPIC
Bilirubin Urine: NEGATIVE
Glucose, UA: NEGATIVE mg/dL
Ketones, ur: NEGATIVE mg/dL
Leukocytes, UA: NEGATIVE
Nitrite: NEGATIVE
Specific Gravity, Urine: 1.011 (ref 1.005–1.030)
pH: 6.5 (ref 5.0–8.0)

## 2012-08-11 NOTE — ED Notes (Signed)
Dr. Lavella Lemons in to see family and to speak with them.

## 2012-08-11 NOTE — ED Notes (Addendum)
Patient returned from CT at this time.

## 2012-08-11 NOTE — ED Notes (Signed)
Patient placed back on cardiac monitor and back on 2L O2 [place].

## 2012-08-11 NOTE — ED Notes (Signed)
Patient  Dustin to go home after Dr Lavella Lemons spoke with them about discharge.

## 2012-08-11 NOTE — ED Provider Notes (Signed)
Follow up with patient to discuss CT findings and review case.   Patient with severe abdominal pain - "so bad he was crying" per wife. Better after tx with morphine. Was preceded by chest pain. Chest pain seemed to  Migrate into abdomen. Patient still has lingering abdominal pain. He has chronic stable angina and the chest pain which he experienced earlier was not atypical for that which he usually experiences every other day.   Abd exam is benign. Non IV contrasted CT scan is negative for identifiable cause of abdominal pain. However, in light of lactic acidosis an patient's history of vascular disease, I am strongly suspicious of mesenteric ischemia as cause of sx.   I spent approximately 20 m with the patient, his wife and dtr explaining this suspected diagnosis, possibility for IR and indication for admission for MR imaging, surgical and radiologic consultations. Patient was initially emphatic about going home because he was agree about his lengthy ED stay. He is now conferring with family and may reconsider.    0405:  Visited with patient and his family again. The patient is adamant that he does not want to be admitted to the hospital. He is medically savvy and seems to completely understand the suspected diagnosis of mesenteric ischemia as well as the potential consequences of gangrenous bowel leading to death.  He says that he wants to consult his Hospice doctor with whom he has an appt at 0900 and that, in general, he does not want to undergo any further procedures. I respect this informed choice and will discharge the patient. He has a good supply of pain medication.   Brandt Loosen, MD 08/11/12 [phone]

## 2012-08-11 NOTE — ED Notes (Signed)
Patient in CT at this time.

## 2012-09-05 ENCOUNTER — Other Ambulatory Visit: Payer: Self-pay | Admitting: Geriatric Medicine

## 2012-09-05 MED ORDER — LEVALBUTEROL TARTRATE 45 MCG/ACT IN AERO: 1.0000 | INHALATION_SPRAY | RESPIRATORY_TRACT | Status: AC | PRN

## 2012-09-15 ENCOUNTER — Ambulatory Visit (INDEPENDENT_AMBULATORY_CARE_PROVIDER_SITE_OTHER): Payer: Medicare Other | Admitting: *Deleted

## 2012-09-15 ENCOUNTER — Encounter: Payer: Self-pay | Admitting: Internal Medicine

## 2012-09-15 DIAGNOSIS — Z95810 Presence of automatic (implantable) cardiac defibrillator: Secondary | ICD-10-CM

## 2012-09-15 DIAGNOSIS — I2589 Other forms of chronic ischemic heart disease: Secondary | ICD-10-CM

## 2012-09-15 DIAGNOSIS — I5022 Chronic systolic (congestive) heart failure: Secondary | ICD-10-CM

## 2012-09-18 ENCOUNTER — Other Ambulatory Visit: Payer: Self-pay | Admitting: Geriatric Medicine

## 2012-09-18 MED ORDER — PRAVASTATIN SODIUM 40 MG PO TABS: 40.0000 mg | ORAL_TABLET | Freq: Every evening | ORAL | Status: AC

## 2012-09-28 LAB — REMOTE ICD DEVICE
AL AMPLITUDE: 1.5 mv
AL IMPEDENCE ICD: 418 Ohm
BAMS-0001: 170 {beats}/min
BATTERY VOLTAGE: 2.9685 V
BRDY-0003LV: 130 {beats}/min
CHARGE TIME: 10.58 s
LV LEAD THRESHOLD: 1.125 V
PACEART VT: 0
RV LEAD AMPLITUDE: 19.6 mv
RV LEAD IMPEDENCE ICD: 380 Ohm
TOT-0001: 1
TOT-0002: 0
TOT-0006: 20100929000000
TZAT-0001ATACH: 1
TZAT-0001ATACH: 2
TZAT-0001SLOWVT: 1
TZAT-0001SLOWVT: 2
TZAT-0002ATACH: NEGATIVE
TZAT-0002ATACH: NEGATIVE
TZAT-0004SLOWVT: 8
TZAT-0004SLOWVT: 8
TZAT-0005SLOWVT: 88 pct
TZAT-0005SLOWVT: 91 pct
TZAT-0011SLOWVT: 10 ms
TZAT-0011SLOWVT: 10 ms
TZAT-0012ATACH: 150 ms
TZAT-0013SLOWVT: 2
TZAT-0018ATACH: NEGATIVE
TZAT-0018ATACH: NEGATIVE
TZAT-0018ATACH: NEGATIVE
TZAT-0018FASTVT: NEGATIVE
TZAT-0018SLOWVT: NEGATIVE
TZAT-0019ATACH: 6 V
TZAT-0019FASTVT: 8 V
TZAT-0020ATACH: 1.5 ms
TZAT-0020FASTVT: 1.5 ms
TZON-0003ATACH: 350 ms
TZON-0004SLOWVT: 28
TZON-0004VSLOWVT: 32
TZON-0005SLOWVT: 12
TZST-0001FASTVT: 2
TZST-0001FASTVT: 3
TZST-0001FASTVT: 6
TZST-0001SLOWVT: 3
TZST-0001SLOWVT: 4
TZST-0001SLOWVT: 6
TZST-0002ATACH: NEGATIVE
TZST-0002ATACH: NEGATIVE
TZST-0002FASTVT: NEGATIVE
TZST-0002FASTVT: NEGATIVE
TZST-0002FASTVT: NEGATIVE
TZST-0003SLOWVT: 35 J

## 2012-10-13 ENCOUNTER — Encounter: Payer: Self-pay | Admitting: *Deleted

## 2012-11-07 ENCOUNTER — Other Ambulatory Visit: Payer: Self-pay | Admitting: Internal Medicine

## 2012-11-09 ENCOUNTER — Encounter: Payer: Self-pay | Admitting: Nurse Practitioner

## 2012-11-09 ENCOUNTER — Ambulatory Visit (INDEPENDENT_AMBULATORY_CARE_PROVIDER_SITE_OTHER): Payer: Medicare Other | Admitting: Nurse Practitioner

## 2012-11-09 VITALS — BP 122/60 | HR 56 | Temp 98.2°F | Wt 160.0 lb

## 2012-11-09 DIAGNOSIS — M25579 Pain in unspecified ankle and joints of unspecified foot: Secondary | ICD-10-CM

## 2012-11-09 DIAGNOSIS — G589 Mononeuropathy, unspecified: Secondary | ICD-10-CM

## 2012-11-09 DIAGNOSIS — Z23 Encounter for immunization: Secondary | ICD-10-CM

## 2012-11-09 DIAGNOSIS — G629 Polyneuropathy, unspecified: Secondary | ICD-10-CM

## 2012-11-09 MED ORDER — GABAPENTIN 300 MG PO CAPS: 300.0000 mg | ORAL_CAPSULE | Freq: Every day | ORAL | Status: AC

## 2012-11-09 NOTE — Patient Instructions (Signed)
To wear loose fitting shoes to prevent pressure on sides of feet Keep pressure off area in bed with pillows  May use rubs to help with pain   Will give gabapentin 300 mg to help with neuropathy  To keep follow up with Dr Chilton Si

## 2012-11-09 NOTE — Progress Notes (Signed)
Patient ID: Dustin [name], male   DOB: 1927-01-17, 79 y.o.   MRN: [id number]   Allergies  Allergen Reactions  . Codeine     REACTION: "makes me crazy"  . Inderal [Propranolol] Other (See Comments)    unknown  . Nsaids Other (See Comments)    GI bleeds  . Sulfamethoxazole W-Trimethoprim   . Tramadol Other (See Comments)    Hallucinations  . Mirtazapine Anxiety and Other (See Comments)    Personality changes    Chief Complaint  Patient presents with  . Foot Pain    bilateral foot pain x 2 months, patient with raised areas on feet. Pain is constant, sever when laying down. Left foot tingles at times   . Immunizations    flu vaccine   . Skin Problem    examine head and upper left arm     HPI: Patient is a 79 y.o. male with a pmh of diabetes, CHF, CKD, IBM (inclusion body myositis) who is under hospice care is seen in the office today for bilateral feet tingling at night and painful; also has a knot on the side of both feet Pt has long standing diabetes which he reports is under good control last HGBA1C was 6.1.  Pt reports over the last month he has been having worsening foot pain that just comes on at night; makes it very hard for him to sleep due to the tingling.  Also notes on lateral aspect of  Bilateral feet a bony prominence that has become very painful; mostly stays in the wheelchair and walks minimally with a walker   Review of Systems:  Review of Systems  Constitutional: Negative for fever, chills and malaise/fatigue.  Respiratory: Negative for cough and shortness of breath.   Cardiovascular: Negative for chest pain and palpitations.  Musculoskeletal: Positive for myalgias and joint pain.       Takes oxycodone and morphine regularly   Neurological: Positive for tingling, tremors and weakness (generalized weakness).     Past Medical History  Diagnosis Date  . IHD (ischemic heart disease)      -  non-ST elevation MI in July 2009. and NSTEMI Jan 2010 in setting of RLL  CAP. 99% diagonal  disease s/p stent July of 2009. Cath Jan 2010 showed  occulusion of SVG to RCA   LVEF 45%. rec med rx   - Echo 10/29/08 EF 25%  . PAD (peripheral artery disease)   . Hyperlipidemia   . Hypertension   . Obstructive sleep apnea      Refuses to wear his CPAP.  Not on oxygen   . Vocal cord dysfunction     with Botox injection at [facility] around 2005  per patient.     - Patient's swallowing difficulties may be related to vocal    cord dysfunction, recommended to be followed up at [facility]  . Stroke     History of left brain cerebrovascular accident in 1998 without   . Dysphagia       -swallowing study nl oropharyngeal swallow, brief pause  at top of the esophagus which clears with liquid wash or small bites.    Rec  regular diet with thin liquids and continue management of    esophageal dysmotility.   - Barium swallow 10/31/08  mild dilation upper esosphagus, moderate slowing, can't r/o mass  . Anemia         03/21/2008 hgb 10.9, MCV 86, Stool occult blood postiive -> On  IV Iron   . COPD (chronic obstructive pulmonary disease) 01/12/2012    - Hypoxemia, former smoker-  FEV1 1.86 (72%) , ratio 68% (07/26/08) - FEV1 1.61 (62%) , ratio 74    ( 10/29/08) with no insp or exp truncation on f/v loop  . IBM (inclusion body myositis) 2011    diagnosed three years ago per patient  . Coronary artery disease   . Myocardial infarction   . Anginal pain   . Dysrhythmia   . ICD (implantable cardiac defibrillator) in place   . Shortness of breath   . Diabetes mellitus without complication   . CHF (congestive heart failure)   . GERD (gastroesophageal reflux disease)   . Cancer     leukemia  . AML (acute myeloid leukemia) in remission   . Hospice care patient   . Gout, unspecified 04/17/2006  . Anxiety 02/21/2007  . Prostatitis, unspecified 06/18/2005  . Diverticulitis of colon (without mention of hemorrhage) 06/17/2005  . Hypertrophy of prostate with urinary  obstruction and other lower urinary tract symptoms (LUTS) 01/12/2012  . Personal history of fall 01/12/2012  . Pneumonitis due to inhalation of food or vomitus 12/09/2011  . Other voice and resonance disorders 10/21/2011  . Chronic kidney disease, stage III (moderate) 05/27/2010  . Sciatica 05/27/2010  . Chronic pain syndrome 05/07/2010  . Diverticulosis of colon (without mention of hemorrhage) 05/07/2010  . Pain in joint, pelvic region and thigh 03/25/2010  . Hallucinations 03/04/2010  . Abnormality of gait 03/04/2010  . Debility, unspecified 10/07/2009  . Insomnia, unspecified 07/16/2009  . Other primary cardiomyopathies 01/15/2009  . Atrial fibrillation 12/23/2008  . Long term (current) use of anticoagulants 12/23/2008  . Chronic sinusitis 10/09/2008  . Lumbago 10/09/2008  . Obstructive sleep apnea (adult) (pediatric) 09/14/2008  . Unspecified hereditary and idiopathic peripheral neuropathy 11/22/2006  . Impotence of organic origin 11/22/2006  . Dysphagia, unspecified(787.20) 11/22/2006  . Occlusion and stenosis of carotid artery without mention of cerebral infarction 06/20/2006   Past Surgical History  Procedure Laterality Date  . Coronary artery bypass graft    . Abdominal aortic aneurysm repair  09/1992    Hart Rochester, MD  . Aortobifemoral bypass graft in the past    . Icd insertion      ICD Medtronic  . Ptca  08/1991    Little MD  . Cardiac catheterization  08/2001    Little,MD   Social History:   reports that he quit smoking about 32 years ago. His smoking use included Cigarettes. He has a 7.5 pack-year smoking history. His smokeless tobacco use includes Chew. He reports that  drinks alcohol. He reports that he does not use illicit drugs.  Family History  Problem Relation Age of Onset  . Alzheimer's disease Sister   . Alzheimer's disease Mother   . Heart disease Mother     Medications: Patient's Medications  New Prescriptions   No medications on file  Previous  Medications   ALBUTEROL (PROVENTIL) (2.5 MG/3ML) 0.083% NEBULIZER SOLUTION    Inhale 1 unit dose vial nebulizer  4  times a day as needed for shortness of breathe.   ALLOPURINOL (ZYLOPRIM) 300 MG TABLET    Take 300 mg by mouth every evening. Take 1 tablet daily for gout.   ALPRAZOLAM (XANAX) 0.5 MG TABLET    Take 0.5 mg by mouth at bedtime as needed for sleep. Take one tablet once a day at bedtime   ASPIRIN 81 MG TABLET    Take 1 tablet (81  mg total) by mouth every morning.   BENZONATATE (TESSALON) 200 MG CAPSULE    Take 200 mg by mouth 3 (three) times daily as needed for cough. Take one tablet at bedtime   CETIRIZINE (ZYRTEC) 10 MG TABLET    Take 10 mg by mouth daily. Take one tablet once a day as needed for allergies   CHOLECALCIFEROL (VITAMIN D) 1000 UNITS TABLET    Take 2,000 Units by mouth every evening.   DEXTROMETHORPHAN-GUAIFENESIN (MUCINEX DM) 30-600 MG PER 12 HR TABLET    Take 1 tablet by mouth every 12 (twelve) hours. Take one twice daily for cough and congestion.   DIPHENHYDRAMINE (BENADRYL) 25 MG TABLET    Take 25 mg by mouth every 6 (six) hours as needed for itching. Take one tablet at bedtime   FUROSEMIDE (LASIX) 20 MG TABLET    Take 2-3 tablets (40-60 mg total) by mouth 2 (two) times daily.   GLIPIZIDE (GLUCOTROL) 5 MG TABLET    TAKE 1 TABLET BY MOUTH EVERY MORNING REGARDLESS OF BLOOD SUGAR   IRON POLYSACCHARIDES (NIFEREX) 150 MG CAPSULE    Take 150 mg by mouth every morning.    ISOSORBIDE-HYDRALAZINE (BIDIL) 20-37.5 MG PER TABLET    Take 2 tablets by mouth 3 (three) times daily.   LEVALBUTEROL (XOPENEX HFA) 45 MCG/ACT INHALER    Inhale 1-2 puffs into the lungs every 4 (four) hours as needed for wheezing.   METOLAZONE (ZAROXOLYN) 5 MG TABLET    Take 5 mg by mouth daily. Take one tablet if patient gains 2 pounds   MORPHINE (ROXANOL) 20 MG/ML CONCENTRATED SOLUTION    Take 5 mg by mouth every 2 (two) hours as needed. For pain   NEBIVOLOL (BYSTOLIC) 5 MG TABLET    Take 1/2 tablet by  mouth once daily for blood pressure   NITROGLYCERIN (NITROSTAT) 0.4 MG SL TABLET    Place 0.4 mg under the tongue every 5 (five) minutes x 3 doses as needed. For chest pain   OMEPRAZOLE (PRILOSEC) 20 MG CAPSULE    Take 20-40 mg by mouth 2 (two) times daily. Take 2 capsules in the morning and 1 capsule in the evenings for reflux.   ONDANSETRON (ZOFRAN) 4 MG TABLET    Take 4 mg by mouth every 8 (eight) hours as needed for nausea. Take one tablet every 6 hours as needed for nausea   OXYBUTYNIN (DITROPAN) 5 MG TABLET    Take 1 tablet (5 mg total) by mouth every 8 (eight) hours as needed.   OXYCODONE 10 MG TABS    Take 1 tablet (10 mg total) by mouth 3 (three) times daily.   OXYGEN-HELIUM IN    Inhale into the lungs. Use 2-4 liters as needed   PE-DIPHENHYDRAMINE-DM-GG-APAP (MUCINEX FAST-MAX DAY/NIGHT PO)    Take by mouth. Take 1 to 2 tablets as needed for  cough and congestion   POLYETHYLENE GLYCOL (MIRALAX / GLYCOLAX) PACKET    Take 17 g by mouth daily.   PRAVASTATIN (PRAVACHOL) 40 MG TABLET    Take 1 tablet (40 mg total) by mouth every evening.   RANOLAZINE (RANEXA) 500 MG 12 HR TABLET    Take 1 tablet (500 mg total) by mouth every morning.   SENNA-DOCUSATE (SENOKOT-S) 8.6-50 MG PER TABLET    Take 3 tablets by mouth 2 (two) times daily. Take daily for bowels.   SERTRALINE (ZOLOFT) 100 MG TABLET    Take 100 mg by mouth every morning. Take 1 tablet orally daily to  help nerves.   SPIRONOLACTONE (ALDACTONE) 25 MG TABLET    Take one tablet once a day for edema  Modified Medications   No medications on file  Discontinued Medications   No medications on file     Physical Exam:  Filed Vitals:   11/09/12 1056  BP: 122/60  Pulse: 56  Temp: 98.2 F (36.8 C)  TempSrc: Oral  Weight: 160 lb (72.576 kg)    Physical Exam  Constitutional: He is oriented to person, place, and time and well-developed, well-nourished, and in no distress. No distress.  Cardiovascular: Normal rate, regular rhythm and  normal heart sounds.   Pulmonary/Chest: Effort normal and breath sounds normal. No respiratory distress. He has no wheezes.  Musculoskeletal:       Feet:  Bony prominences of proximal end of 5th metatarsal bone  Neurological: He is alert and oriented to person, place, and time. He displays weakness. A sensory deficit (decrease vibatory sensation to bilateral feet and ankles) is present. He exhibits abnormal muscle tone.  Skin: Skin is warm and dry. He is not diaphoretic.  Multiple patches of dry skin on hands and head  Psychiatric: Affect normal.    Labs reviewed: Basic Metabolic Panel:  Recent Labs  16/10/96 0420  06/07/12 0825  06/11/12 0520 06/12/12 0555 08/10/12 2200 08/10/12 2212  NA 141  < > 142  < > 140 138 138 139  K 3.3*  < > 3.7  < > 3.5 4.1 4.2 3.8  CL 104  < > 97  < > 96 94* 98 101  CO2 31  < > 37*  < > 38* 38* 26  --   GLUCOSE 95  < > 129*  < > 131* 137* 127* 123*  BUN 26*  < > 46*  < > 45* 48* 70* 66*  CREATININE 1.34  < > 1.46*  < > 1.46* 1.60* 2.07* 2.30*  CALCIUM 9.0  < > 10.2  < > 9.6 9.8 10.4  --   MG  --   --  1.8  --   --   --   --   --   TSH 2.076  --   --   --   --   --   --   --   < > = values in this interval not displayed. Liver Function Tests:  Recent Labs  01/25/12 0745 01/26/12 0520 08/10/12 2200  AST 25 24 26   ALT 14 14 20   ALKPHOS 117 126* 108  BILITOT 0.6 0.5 0.3  PROT 6.7 6.6 7.2  ALBUMIN 3.5 3.3* 3.9    Recent Labs  08/10/12 2200  LIPASE 27   No results found for this basename: AMMONIA,  in the last 8760 hours CBC:  Recent Labs  01/26/12 0520  06/01/12 2048 06/04/12 0550 08/10/12 2200 08/10/12 2212  WBC 6.7  < > 6.1 5.9 7.5  --   NEUTROABS 4.7  --   --  4.0 4.5  --   HGB 11.6*  < > 11.2* 10.1* 12.1* 12.6*  HCT 37.8*  < > 34.0* 30.8* 35.5* 37.0*  MCV 98.4  < > 91.9 92.2 91.0  --   PLT 181  < > 151 137* 172  --   < > = values in this interval not displayed.  Assessment/Plan 1. Need for prophylactic vaccination  and inoculation against influenza Flu vaccine given 2. Neuropathy Will start gabapentin qhs to help with numbness and tingling - gabapentin (NEURONTIN) 300 MG capsule; Take 1  capsule (300 mg total) by mouth at bedtime.  Dispense: 90 capsule; Refill: 3  3. Pain in joint, ankle and foot, unspecified laterality Pain to bony promience; encouraged pt to make sure no pressure was on side of foot to prevent breakdown also with increase in pressure from shoes and laying on side can cause breakdown of skin so to be aware and assess foot daily for increased redness or changes in the skin; elevate area if laying on side  To keep follow up with Dr Chilton Si

## 2012-11-17 ENCOUNTER — Other Ambulatory Visit: Payer: Self-pay | Admitting: *Deleted

## 2012-11-17 ENCOUNTER — Other Ambulatory Visit: Payer: Self-pay | Admitting: Cardiovascular Disease

## 2012-11-17 ENCOUNTER — Telehealth: Payer: Self-pay | Admitting: Cardiovascular Disease

## 2012-11-17 NOTE — Telephone Encounter (Signed)
Informed daughter nitro has been sent to pharmacy for #25 with no refills. Patient not seen since 2011. He will need appointment before additional refills can be given.

## 2012-11-17 NOTE — Telephone Encounter (Signed)
Has some questions about her father medication because it was said that Dr. Murray Hodgkins did not have permission to refill it and it was a note in his file .Marland Kitchen Please Call    Thanks

## 2012-11-17 NOTE — Telephone Encounter (Signed)
Rx was sent to pharmacy electronically. 

## 2012-11-23 ENCOUNTER — Encounter: Payer: Medicare Other | Admitting: Internal Medicine

## 2012-11-28 ENCOUNTER — Ambulatory Visit: Payer: Medicare Other | Admitting: Internal Medicine

## 2012-11-29 ENCOUNTER — Telehealth: Payer: Self-pay | Admitting: *Deleted

## 2012-11-29 NOTE — Telephone Encounter (Signed)
Received Urine results from [facility]. Dr. Chilton Si stated to call---Normal Urine. Patient wife Notified.

## 2012-12-12 ENCOUNTER — Encounter: Payer: Self-pay | Admitting: Internal Medicine

## 2012-12-12 ENCOUNTER — Ambulatory Visit: Payer: Self-pay | Admitting: Internal Medicine

## 2012-12-14 ENCOUNTER — Encounter: Payer: Self-pay | Admitting: Internal Medicine

## 2012-12-15 ENCOUNTER — Other Ambulatory Visit: Payer: Self-pay | Admitting: Nurse Practitioner

## 2012-12-21 ENCOUNTER — Telehealth: Payer: Self-pay

## 2012-12-21 NOTE — Telephone Encounter (Signed)
Hospice was calling to discuss patient's Lasix (furosemide)  20 mg is given 3 by mouth in the am and 1 by mouth in the pm for a total of 4 pills daily. ? If patient can have higher dispense number. Last filled @ #90 which does not last the entire month.  Hospice nurse also questions if dose can be change to decrease the amount of pills patient is given.   Dr.Green please advise (verified pharmacy as [facility] on [address] and [address])  Last OV 11/09/2012

## 2012-12-22 ENCOUNTER — Other Ambulatory Visit: Payer: Self-pay | Admitting: Internal Medicine

## 2012-12-22 MED ORDER — FUROSEMIDE 20 MG PO TABS: ORAL_TABLET | ORAL | Status: DC

## 2012-12-22 NOTE — Telephone Encounter (Signed)
I sent the prescription today.

## 2012-12-22 NOTE — Telephone Encounter (Signed)
Noted  

## 2013-01-03 ENCOUNTER — Ambulatory Visit (INDEPENDENT_AMBULATORY_CARE_PROVIDER_SITE_OTHER): Payer: Medicare Other | Admitting: Internal Medicine

## 2013-01-03 ENCOUNTER — Encounter: Payer: Self-pay | Admitting: Internal Medicine

## 2013-01-03 VITALS — BP 124/70 | HR 72 | Temp 98.4°F | Wt 156.0 lb

## 2013-01-03 DIAGNOSIS — I10 Essential (primary) hypertension: Secondary | ICD-10-CM

## 2013-01-03 DIAGNOSIS — I2589 Other forms of chronic ischemic heart disease: Secondary | ICD-10-CM

## 2013-01-03 DIAGNOSIS — M109 Gout, unspecified: Secondary | ICD-10-CM

## 2013-01-03 DIAGNOSIS — I5022 Chronic systolic (congestive) heart failure: Secondary | ICD-10-CM

## 2013-01-03 DIAGNOSIS — R05 Cough: Secondary | ICD-10-CM

## 2013-01-03 DIAGNOSIS — N644 Mastodynia: Secondary | ICD-10-CM

## 2013-01-03 DIAGNOSIS — R1312 Dysphagia, oropharyngeal phase: Secondary | ICD-10-CM

## 2013-01-03 DIAGNOSIS — E119 Type 2 diabetes mellitus without complications: Secondary | ICD-10-CM

## 2013-01-03 DIAGNOSIS — R350 Frequency of micturition: Secondary | ICD-10-CM

## 2013-01-03 DIAGNOSIS — E785 Hyperlipidemia, unspecified: Secondary | ICD-10-CM

## 2013-01-03 DIAGNOSIS — C9201 Acute myeloblastic leukemia, in remission: Secondary | ICD-10-CM

## 2013-01-03 NOTE — Patient Instructions (Signed)
Continue current medications. 

## 2013-01-03 NOTE — Progress Notes (Signed)
Patient ID: Dustin [name], male   DOB: 10/15/1926, 79 y.o.   MRN: [id number] Location:    [facility]  Place of Service:  OFFICE  PCP: Kimber Relic, MD  Code Status: NCB  Allergies  Allergen Reactions  . Codeine     REACTION: "makes me crazy"  . Inderal [Propranolol] Other (See Comments)    unknown  . Nsaids Other (See Comments)    GI bleeds  . Sulfamethoxazole-Trimethoprim   . Tramadol Other (See Comments)    Hallucinations  . Mirtazapine Anxiety and Other (See Comments)    Personality changes    Chief Complaint  Patient presents with  . Medical Managment of Chronic Issues    Follow-up, patient is not walking well   . Urinary Tract Infection    painful, spasms, denies blood. Patient c/o of symptoms x 3-4 months   . Tender Nipples    patient c/o of swelling and tenderness in chest/nipple area x 3-4 weeks   . Foot Problem    Heel pain and peeling on heels x 6 weeks     HPI:   Chronic systolic heart failure: This is a major debilitating problem at this time. He continues to be weak. He is short of breath with minimal exertion. He continues with peripheral edema. There is persistent cough believed to be related to his pulmonary congestion. He has been losing weight and is quite frail.  AML (acute myeloid leukemia) in remission: Continues in remission  Cough: Likely related to his chronic congestive heart failure and complicated by his dysphagia with a high likelihood of recurrent small aspirations  CKD (chronic kidney disease) stage 3, GFR 30-59 ml/min: Unchanged  Dysphagia, oropharyngeal phase: Associated with recurrent small aspirations and persistent cough  HTN (hypertension): Controlled  HLD (hyperlipidemia) : Controlled  Diabetes : Controlled  Gout, unspecified : norecent acute attacks  Urinary frequency : Denies dysuria. History of recurrent urinary tract infections  Mastodynia: Bilateral mild breast enlargement with discomfort.      Past Medical History   Diagnosis Date  . IHD (ischemic heart disease)      -  non-ST elevation MI in July 2009. and NSTEMI Jan 2010 in setting of RLL CAP. 99% diagonal  disease s/p stent July of 2009. Cath Jan 2010 showed  occulusion of SVG to RCA   LVEF 45%. rec med rx   - Echo 10/29/08 EF 25%  . PAD (peripheral artery disease)   . Hyperlipidemia   . Hypertension   . Obstructive sleep apnea      Refuses to wear his CPAP.  Not on oxygen   . Vocal cord dysfunction     with Botox injection at [facility] around 2005  per patient.     - Patient's swallowing difficulties may be related to vocal    cord dysfunction, recommended to be followed up at [facility]  . Stroke     History of left brain cerebrovascular accident in 1998 without   . Dysphagia       -swallowing study nl oropharyngeal swallow, brief pause  at top of the esophagus which clears with liquid wash or small bites.    Rec  regular diet with thin liquids and continue management of    esophageal dysmotility.   - Barium swallow 10/31/08  mild dilation upper esosphagus, moderate slowing, can't r/o mass  . Anemia         03/21/2008 hgb 10.9, MCV 86, Stool occult blood postiive ->  On IV Iron   . COPD (chronic obstructive pulmonary disease) 01/12/2012    - Hypoxemia, former smoker-  FEV1 1.86 (72%) , ratio 68% (07/26/08) - FEV1 1.61 (62%) , ratio 74    ( 10/29/08) with no insp or exp truncation on f/v loop  . IBM (inclusion body myositis) 2011    diagnosed three years ago per patient  . Coronary artery disease   . Myocardial infarction   . Anginal pain   . Dysrhythmia   . ICD (implantable cardiac defibrillator) in place   . Shortness of breath   . Diabetes mellitus without complication   . CHF (congestive heart failure)   . GERD (gastroesophageal reflux disease)   . Cancer     leukemia  . AML (acute myeloid leukemia) in remission   . Hospice care patient   . Gout, unspecified 04/17/2006  . Anxiety 02/21/2007  . Prostatitis,  unspecified 06/18/2005  . Diverticulitis of colon (without mention of hemorrhage) 06/17/2005  . Hypertrophy of prostate with urinary obstruction and other lower urinary tract symptoms (LUTS) 01/12/2012  . Personal history of fall 01/12/2012  . Pneumonitis due to inhalation of food or vomitus 12/09/2011  . Other voice and resonance disorders 10/21/2011  . Chronic kidney disease, stage III (moderate) 05/27/2010  . Sciatica 05/27/2010  . Chronic pain syndrome 05/07/2010  . Diverticulosis of colon (without mention of hemorrhage) 05/07/2010  . Pain in joint, pelvic region and thigh 03/25/2010  . Hallucinations 03/04/2010  . Abnormality of gait 03/04/2010  . Debility, unspecified 10/07/2009  . Insomnia, unspecified 07/16/2009  . Other primary cardiomyopathies 01/15/2009  . Atrial fibrillation 12/23/2008  . Long term (current) use of anticoagulants 12/23/2008  . Chronic sinusitis 10/09/2008  . Lumbago 10/09/2008  . Obstructive sleep apnea (adult) (pediatric) 09/14/2008  . Unspecified hereditary and idiopathic peripheral neuropathy 11/22/2006  . Impotence of organic origin 11/22/2006  . Dysphagia, unspecified(787.20) 11/22/2006  . Occlusion and stenosis of carotid artery without mention of cerebral infarction 06/20/2006    Past Surgical History  Procedure Laterality Date  . Coronary artery bypass graft    . Abdominal aortic aneurysm repair  09/1992    Hart Rochester, MD  . Aortobifemoral bypass graft in the past    . Icd insertion      ICD Medtronic  . Ptca  08/1991    Little MD  . Cardiac catheterization  08/2001    Little,MD  . Lithotripsy  1998  . Myelogram  1990    Lumbar  . Cardiovascular stress test  11/1996    4% LVEF  . Cardiovascular stress test  12/1996    Abnormal    Social History: History   Social History  . Marital Status: Married    Spouse Name: N/A    Number of Children: 5  . Years of Education: N/A   Occupational History  . Retired    Social History Main  Topics  . Smoking status: Former Smoker -- 0.50 packs/day for 15 years    Types: Cigarettes    Quit date: 02/23/1980  . Smokeless tobacco: Current User    Types: Chew  . Alcohol Use: Yes  . Drug Use: No  . Sexual Activity: No   Other Topics Concern  . None   Social History Narrative   Quit smoking in 1982, smoked 1/2 pack a day for 15 years.   Married   Retired   Patient has nine siblings       He worked at [employer] and  was exposed to     whiskey fumes for 8 years.  He is married.  Smoked for 20 years 3-4     cigarettes a day, but quit 30 years ago.  Does not drink other than     socially.  He is retired, has 5 children and other grandchildren.          Dr.Botero-Neuro Surgeon   Dr.Lawson-CVTS   Dr.Weissman-GI   Dr.Love-Neurologist    Dr.Gioffrey- Orthopedics   Dr.Whitfield- Orthopedics    Dr.Holland- Ophthalmology   Dr.Wrenn-Urologist   Dr.Paul-Orthopedics    Dr.Little-Cardiologist   Dr.Grove-Cardiologist   Dr.Powell-Hematologist at [facility]   Dr.Fledman-Hospice              Family History Family Status  Relation Status Death Age  . Sister Deceased   . Brother Deceased   . Mother Deceased     Dementia  . Daughter Alive   . Sister Deceased   . Sister Deceased 37  . Daughter Alive   . Daughter Alive   . Father Deceased     Stroke  . Son Alive   . Sister Deceased   . Sister Deceased   . Sister Alive   . Sister Alive   . Brother Deceased   . Daughter Alive    Family History  Problem Relation Age of Onset  . Alzheimer's disease Sister   . Alzheimer's disease Mother   . Heart disease Mother   . Cancer Sister   . AAA (abdominal aortic aneurysm) Sister   . COPD Sister   . AAA (abdominal aortic aneurysm) Brother   . Fibromyalgia Daughter   . Fibromyalgia Daughter   . Fibromyalgia Daughter   . Lupus Daughter   . Multiple sclerosis Maternal Uncle   . Multiple sclerosis Other     Neice (brothers daughter)  . Multiple sclerosis  Other     Great neice    Immunization History  Administered Date(s) Administered  . Influenza,inj,Quad PF,36+ Mos 11/09/2012  . PPD Test 06/09/2012  . Pneumococcal Polysaccharide-23 11/22/2004     Medications: Patient's Medications  New Prescriptions   No medications on file  Previous Medications   ALBUTEROL (PROVENTIL) (2.5 MG/3ML) 0.083% NEBULIZER SOLUTION    Inhale 1 unit dose vial nebulizer  4  times a day as needed for shortness of breathe.   ALLOPURINOL (ZYLOPRIM) 300 MG TABLET    Take 300 mg by mouth every evening. Take 1 tablet daily for gout.   ALPRAZOLAM (XANAX) 0.5 MG TABLET    Take 0.5 mg by mouth at bedtime as needed for sleep. Take one tablet once a day at bedtime   ASPIRIN 81 MG TABLET    Take 1 tablet (81 mg total) by mouth every morning.   BENZONATATE (TESSALON) 200 MG CAPSULE    Take 200 mg by mouth 3 (three) times daily as needed for cough. Take one tablet at bedtime   CETIRIZINE (ZYRTEC) 10 MG TABLET    Take 10 mg by mouth daily. Take one tablet once a day as needed for allergies   CHOLECALCIFEROL (VITAMIN D) 1000 UNITS TABLET    Take 2,000 Units by mouth every evening.   DEXTROMETHORPHAN-GUAIFENESIN (MUCINEX DM) 30-600 MG PER 12 HR TABLET    Take 1 tablet by mouth every 12 (twelve) hours. Take one twice daily for cough and congestion.   DIPHENHYDRAMINE (BENADRYL) 25 MG TABLET    Take 25 mg by mouth every 6 (six) hours as needed for itching. Take one tablet at  bedtime   FUROSEMIDE (LASIX) 20 MG TABLET    3 tablets in the morning and one in the afternoon to prevent fluid accumulation   GABAPENTIN (NEURONTIN) 300 MG CAPSULE    Take 1 capsule (300 mg total) by mouth at bedtime.   GLIPIZIDE (GLUCOTROL) 5 MG TABLET    TAKE 1 TABLET BY MOUTH EVERY MORNING REGARDLESS OF BLOOD SUGAR   IRON POLYSACCHARIDES (NIFEREX) 150 MG CAPSULE    Take 150 mg by mouth every morning.    ISOSORBIDE-HYDRALAZINE (BIDIL) 20-37.5 MG PER TABLET    Take 2 tablets by mouth 3 (three) times daily.    LEVALBUTEROL (XOPENEX HFA) 45 MCG/ACT INHALER    Inhale 1-2 puffs into the lungs every 4 (four) hours as needed for wheezing.   METOLAZONE (ZAROXOLYN) 5 MG TABLET    Take 5 mg by mouth daily. Take one tablet if patient gains 2 pounds   MORPHINE (ROXANOL) 20 MG/ML CONCENTRATED SOLUTION    Take 5 mg by mouth every 2 (two) hours as needed. For pain   NEBIVOLOL (BYSTOLIC) 5 MG TABLET    Take 1/2 tablet by mouth once daily for blood pressure   NITROSTAT 0.4 MG SL TABLET    PLACE ONE TABLET UNDER TONGUE AS NEEDED FOR CHEST PAIN. MAX OF 3 TABLETS 5 MINUTES APART.   OMEPRAZOLE (PRILOSEC) 20 MG CAPSULE    Take 20-40 mg by mouth 2 (two) times daily. Take 2 capsules in the morning and 1 capsule in the evenings for reflux.   ONDANSETRON (ZOFRAN) 4 MG TABLET    Take 4 mg by mouth every 8 (eight) hours as needed for nausea. Take one tablet every 6 hours as needed for nausea   OXYBUTYNIN (DITROPAN) 5 MG TABLET    Take 1 tablet (5 mg total) by mouth every 8 (eight) hours as needed.   OXYCODONE 10 MG TABS    Take 1 tablet (10 mg total) by mouth 3 (three) times daily.   OXYGEN-HELIUM IN    Inhale into the lungs. Use 2-4 liters as needed   PE-DIPHENHYDRAMINE-DM-GG-APAP (MUCINEX FAST-MAX DAY/NIGHT PO)    Take by mouth. Take 1 to 2 tablets as needed for  cough and congestion   POLYETHYLENE GLYCOL (MIRALAX / GLYCOLAX) PACKET    Take 17 g by mouth daily.   PRAVASTATIN (PRAVACHOL) 40 MG TABLET    Take 1 tablet (40 mg total) by mouth every evening.   RANEXA 500 MG 12 HR TABLET    TAKE 1 TABLET BY MOUTH EVERY MORNING   SENNA-DOCUSATE (SENOKOT-S) 8.6-50 MG PER TABLET    Take 3 tablets by mouth 2 (two) times daily. Take daily for bowels.   SERTRALINE (ZOLOFT) 100 MG TABLET    Take 100 mg by mouth every morning. Take 1 tablet orally daily to help nerves.   SPIRONOLACTONE (ALDACTONE) 25 MG TABLET    Take one tablet once a day for edema  Modified Medications   No medications on file  Discontinued Medications   No medications  on file    Immunization History  Administered Date(s) Administered  . Influenza,inj,Quad PF,36+ Mos 11/09/2012  . PPD Test 06/09/2012  . Pneumococcal Polysaccharide 11/22/2004     Review of Systems  Constitutional: Negative for fever, chills and malaise/fatigue.  HENT: Positive for hearing loss. Negative for ear pain.   Eyes: Negative.   Respiratory: Positive for shortness of breath. Negative for cough, sputum production and wheezing.   Cardiovascular: Negative for chest pain and palpitations.  Apneic episodes at night.  Gastrointestinal: Positive for heartburn and constipation.  Genitourinary: Positive for frequency.       Recurrent urine infections  Musculoskeletal: Positive for falls, joint pain and myalgias.       Takes oxycodone and morphine regularly   Skin: Negative for itching and rash.  Neurological: Positive for tingling, tremors and weakness (generalized weakness). Negative for headaches.  Endo/Heme/Allergies: Bruises/bleeds easily.  Psychiatric/Behavioral: Positive for memory loss. Negative for suicidal ideas, hallucinations and substance abuse. The patient is nervous/anxious and has insomnia.      Filed Vitals:   01/03/13 1616  BP: 124/70  Pulse: 72  Temp: 98.4 F (36.9 C)  TempSrc: Oral  Weight: 156 lb (70.761 kg)  SpO2: 96%   Physical Exam  Constitutional:  Frail, chronically ill, elderly male.  HENT:  Right Ear: External ear normal.  Left Ear: External ear normal.  Nose: Nose normal.  Mouth/Throat: Oropharynx is clear and moist.  Loss of hearing.  Eyes: EOM are normal. Pupils are equal, round, and reactive to light.  Neck: No JVD present. No tracheal deviation present. No thyromegaly present.  Cardiovascular: Normal rate, regular rhythm and normal heart sounds.  Exam reveals no gallop and no friction rub.   No murmur heard. Pulmonary/Chest: No respiratory distress. He has no wheezes. He has rales. He exhibits tenderness (Bilateral mild breast  tissue enlargement and mastodynia.).  Abdominal: He exhibits no distension and no mass. There is no tenderness. No hernia.  Genitourinary:  Prostate enlarged  Musculoskeletal: He exhibits no edema.  Mild back discomfort. Unstable gait. Generalized weakness.  Lymphadenopathy:    He has no cervical adenopathy.  Neurological: He displays normal reflexes. No cranial nerve deficit. He exhibits normal muscle tone. Coordination abnormal.  Reduced vibratory sensation in both lower extremities. Moderate head titubation. Resting tremor bilaterally. Ataxic movements. Positive Romberg.  Skin: No rash noted.  Multiple bruises.  Psychiatric: He has a normal mood and affect. His behavior is normal. Judgment and thought content normal.      Labs reviewed:  Office Visit on 01/03/2013  Component Date Value Ref Range Status  . WBC 01/03/2013 7.4  3.4 - 10.8 x10E3/uL Final  . RBC 01/03/2013 3.87* 4.14 - 5.80 x10E6/uL Final  . Hemoglobin 01/03/2013 12.0* 12.6 - 17.7 g/dL Final  . HCT 46/96/2952 36.5* 37.5 - 51.0 % Final  . MCV 01/03/2013 94  79 - 97 fL Final  . MCH 01/03/2013 31.0  26.6 - 33.0 pg Final  . MCHC 01/03/2013 32.9  31.5 - 35.7 g/dL Final  . RDW 84/13/2440 15.7* 12.3 - 15.4 % Final  . Platelets 01/03/2013 230  150 - 379 x10E3/uL Final  . Neutrophils Relative % 01/03/2013 72   Final  . Lymphs 01/03/2013 21   Final  . Monocytes 01/03/2013 5   Final  . Eos 01/03/2013 2   Final  . Basos 01/03/2013 0   Final  . Neutrophils Absolute 01/03/2013 5.3  1.4 - 7.0 x10E3/uL Final  . Lymphocytes Absolute 01/03/2013 1.5  0.7 - 3.1 x10E3/uL Final  . Monocytes Absolute 01/03/2013 0.4  0.1 - 0.9 x10E3/uL Final  . Eosinophils Absolute 01/03/2013 0.1  0.0 - 0.4 x10E3/uL Final  . Basophils Absolute 01/03/2013 0.0  0.0 - 0.2 x10E3/uL Final  . Immature Granulocytes 01/03/2013 0   Final  . Immature Grans (Abs) 01/03/2013 0.0  0.0 - 0.1 x10E3/uL Final  . Cholesterol, Total 01/03/2013 224* 100 - 199 mg/dL  Final  . Triglycerides 01/03/2013 267* 0 -  149 mg/dL Final  . HDL 57/84/6962 39* >39 mg/dL Final   Comment: According to ATP-III Guidelines, HDL-C >59 mg/dL is considered a                          negative risk factor for CHD.  Marland Kitchen VLDL Cholesterol Cal 01/03/2013 53* 5 - 40 mg/dL Final  . LDL Calculated 01/03/2013 952* 0 - 99 mg/dL Final  . Chol/HDL Ratio 01/03/2013 5.7* 0.0 - 5.0 ratio units Final   Comment:                                   T. Chol/HDL Ratio                                                                      Men  Women                                                        1/2 Avg.Risk  3.4    3.3                                                            Avg.Risk  5.0    4.4                                                         2X Avg.Risk  9.6    7.1                                                         3X Avg.Risk 23.4   11.0  . Hemoglobin A1C 01/03/2013 6.4* 4.8 - 5.6 % Final   Comment:          Increased risk for diabetes: 5.7 - 6.4                                   Diabetes: >6.4                                   Glycemic control for adults with diabetes: <7.0  . Estimated average glucose 01/03/2013 137   Final  . Uric Acid 01/03/2013 6.0  3.7 - 8.6 mg/dL Final              Therapeutic target  for gout patients: <6.0  . Specific Gravity, UA 01/03/2013 1.013  1.005 - 1.030 Final  . pH, UA 01/03/2013 6.0  5.0 - 7.5 Final  . Color, UA 01/03/2013 Yellow  Yellow Final  . Appearance Ur 01/03/2013 Clear  Clear Final  . Leukocytes, UA 01/03/2013 Negative  Negative Final  . Protein, UA 01/03/2013 Negative  Negative/Trace Final  . Glucose, UA 01/03/2013 Negative  Negative Final  . Ketones, UA 01/03/2013 Negative  Negative Final  . RBC, UA 01/03/2013 Negative  Negative Final  . Bilirubin, UA 01/03/2013 Negative  Negative Final  . Urobilinogen, Ur 01/03/2013 0.2  0.0 - 1.9 mg/dL Final  . Nitrite, UA 16/11/9602 Negative  Negative Final  . Urine Culture, Routine  01/03/2013 Final report   Final  . Result 1 01/03/2013 Comment   Final   Comment: Culture shows less than 10,000 colony forming units of bacteria per                          milliliter of urine. This colony count is not generally considered                          to be clinically significant.      Assessment/Plan   1. AML (acute myeloid leukemia) in remission - CBC With differential/Platelet  2. Cough Improved  3. CKD (chronic kidney disease) stage 3, GFR 30-59 ml/min Unchanged  4. Chronic systolic heart failure Unchanged  5. Dysphagia, oropharyngeal phase Some distress with meals  6. HTN (hypertension) Controlled  7. HLD (hyperlipidemia) Controlled - Lipid panel  8. Diabetes Controlled - Hemoglobin A1c  9. Gout, unspecified Controlled - Uric Acid  10. Urinary frequency Most likely related to BPH. Recurrent infections have been present. - Urinalysis - Culture, Urine - nitrofurantoin, macrocrystal-monohydrate, (MACROBID) 100 MG capsule; One twice daily for urine infection  Dispense: 20 capsule; Refill: 1  11. Mastodynia Chronic problem

## 2013-01-04 ENCOUNTER — Telehealth: Payer: Self-pay | Admitting: *Deleted

## 2013-01-04 ENCOUNTER — Other Ambulatory Visit: Payer: Self-pay | Admitting: *Deleted

## 2013-01-04 LAB — URINE CULTURE

## 2013-01-04 LAB — CBC WITH DIFFERENTIAL
Basos: 0 %
HCT: 36.5 % — ABNORMAL LOW (ref 37.5–51.0)
Hemoglobin: 12 g/dL — ABNORMAL LOW (ref 12.6–17.7)
Lymphs: 21 %
MCHC: 32.9 g/dL (ref 31.5–35.7)
Monocytes: 5 %
Neutrophils Absolute: 5.3 10*3/uL (ref 1.4–7.0)

## 2013-01-04 LAB — URINALYSIS
Glucose, UA: NEGATIVE
Protein, UA: NEGATIVE

## 2013-01-04 LAB — LIPID PANEL
Cholesterol, Total: 224 mg/dL — ABNORMAL HIGH (ref 100–199)
LDL Calculated: 132 mg/dL — ABNORMAL HIGH (ref 0–99)
Triglycerides: 267 mg/dL — ABNORMAL HIGH (ref 0–149)
VLDL Cholesterol Cal: 53 mg/dL — ABNORMAL HIGH (ref 5–40)

## 2013-01-04 MED ORDER — NITROFURANTOIN MONOHYD MACRO 100 MG PO CAPS: ORAL_CAPSULE | ORAL | Status: AC

## 2013-01-04 NOTE — Telephone Encounter (Signed)
Patient son called and stated that his father was suppose to have 2 Rx's called into pharmacy. Spoke with Dr. Chilton Si and he stated that he would take care of it. Informed the son.

## 2013-01-09 ENCOUNTER — Telehealth: Payer: Self-pay | Admitting: *Deleted

## 2013-01-09 NOTE — Telephone Encounter (Signed)
Patient daughter, Tessie Fass, Called and stated that patient is having UTI symptoms like incontinence is worse than normal with having several accidents. Having pain when he urinates and getting back almost confused and paranoia. And not eating much. Patient is currently taking an antibiotic which is causing nausea. Urine culture on 01/03/2013 came back negative. Daughter states that the last 2-3 times it has always came back negative. Please Advise.

## 2013-01-09 NOTE — Telephone Encounter (Signed)
We could reculture the urine due to the persistence of symptoms. Or, we can refer to urologist.

## 2013-01-10 ENCOUNTER — Other Ambulatory Visit: Payer: Self-pay | Admitting: *Deleted

## 2013-01-10 DIAGNOSIS — N390 Urinary tract infection, site not specified: Secondary | ICD-10-CM

## 2013-01-10 NOTE — Telephone Encounter (Signed)
Daughter Notified and orders placed to repeat U/A

## 2013-01-11 ENCOUNTER — Telehealth: Payer: Self-pay

## 2013-01-11 NOTE — Telephone Encounter (Signed)
Message left on triage voicemail patient would like for Dr.Green to rx cipro for the patient, although culture was negative patient's daughter states her father is showing signs of a UTI. Patient with 3 UTI's this year.  I called patient's daughter and re-advise as stated in previous phone note that patient needs to re-collect U/A and Culture or refer to Urology.Kathie Rhodes states she would like to know if Dr.Green would just RX Cipro. Kathie Rhodes is not interested in a referral at this time.  Dr.Green please advise

## 2013-01-16 NOTE — Telephone Encounter (Signed)
Left message on VM to f/u on patients status and see if patient would like to consider a referral at this time

## 2013-01-17 ENCOUNTER — Encounter (HOSPITAL_COMMUNITY): Payer: Self-pay | Admitting: Emergency Medicine

## 2013-01-17 ENCOUNTER — Emergency Department (HOSPITAL_COMMUNITY)
Admission: EM | Admit: 2013-01-17 | Discharge: 2013-01-17 | Disposition: A | Discharge status: Home / Self Care | Attending: Emergency Medicine | Admitting: Emergency Medicine

## 2013-01-17 DIAGNOSIS — I4891 Unspecified atrial fibrillation: Secondary | ICD-10-CM | POA: Insufficient documentation

## 2013-01-17 DIAGNOSIS — Z95810 Presence of automatic (implantable) cardiac defibrillator: Secondary | ICD-10-CM | POA: Insufficient documentation

## 2013-01-17 DIAGNOSIS — I252 Old myocardial infarction: Secondary | ICD-10-CM | POA: Insufficient documentation

## 2013-01-17 DIAGNOSIS — Z8701 Personal history of pneumonia (recurrent): Secondary | ICD-10-CM | POA: Insufficient documentation

## 2013-01-17 DIAGNOSIS — Z95818 Presence of other cardiac implants and grafts: Secondary | ICD-10-CM | POA: Insufficient documentation

## 2013-01-17 DIAGNOSIS — N183 Chronic kidney disease, stage 3 unspecified: Secondary | ICD-10-CM | POA: Insufficient documentation

## 2013-01-17 DIAGNOSIS — Y92009 Unspecified place in unspecified non-institutional (private) residence as the place of occurrence of the external cause: Secondary | ICD-10-CM | POA: Insufficient documentation

## 2013-01-17 DIAGNOSIS — J4489 Other specified chronic obstructive pulmonary disease: Secondary | ICD-10-CM | POA: Insufficient documentation

## 2013-01-17 DIAGNOSIS — I2510 Atherosclerotic heart disease of native coronary artery without angina pectoris: Secondary | ICD-10-CM | POA: Insufficient documentation

## 2013-01-17 DIAGNOSIS — G4733 Obstructive sleep apnea (adult) (pediatric): Secondary | ICD-10-CM | POA: Insufficient documentation

## 2013-01-17 DIAGNOSIS — Z951 Presence of aortocoronary bypass graft: Secondary | ICD-10-CM | POA: Insufficient documentation

## 2013-01-17 DIAGNOSIS — Y939 Activity, unspecified: Secondary | ICD-10-CM | POA: Insufficient documentation

## 2013-01-17 DIAGNOSIS — K219 Gastro-esophageal reflux disease without esophagitis: Secondary | ICD-10-CM | POA: Insufficient documentation

## 2013-01-17 DIAGNOSIS — T1490XA Injury, unspecified, initial encounter: Secondary | ICD-10-CM | POA: Insufficient documentation

## 2013-01-17 DIAGNOSIS — E119 Type 2 diabetes mellitus without complications: Secondary | ICD-10-CM | POA: Insufficient documentation

## 2013-01-17 DIAGNOSIS — Z7982 Long term (current) use of aspirin: Secondary | ICD-10-CM | POA: Insufficient documentation

## 2013-01-17 DIAGNOSIS — F29 Unspecified psychosis not due to a substance or known physiological condition: Secondary | ICD-10-CM | POA: Insufficient documentation

## 2013-01-17 DIAGNOSIS — G894 Chronic pain syndrome: Secondary | ICD-10-CM | POA: Insufficient documentation

## 2013-01-17 DIAGNOSIS — F411 Generalized anxiety disorder: Secondary | ICD-10-CM | POA: Insufficient documentation

## 2013-01-17 DIAGNOSIS — Z7901 Long term (current) use of anticoagulants: Secondary | ICD-10-CM | POA: Insufficient documentation

## 2013-01-17 DIAGNOSIS — E785 Hyperlipidemia, unspecified: Secondary | ICD-10-CM | POA: Insufficient documentation

## 2013-01-17 DIAGNOSIS — R4182 Altered mental status, unspecified: Secondary | ICD-10-CM | POA: Insufficient documentation

## 2013-01-17 DIAGNOSIS — I509 Heart failure, unspecified: Secondary | ICD-10-CM | POA: Insufficient documentation

## 2013-01-17 DIAGNOSIS — Z79899 Other long term (current) drug therapy: Secondary | ICD-10-CM | POA: Insufficient documentation

## 2013-01-17 DIAGNOSIS — Z8673 Personal history of transient ischemic attack (TIA), and cerebral infarction without residual deficits: Secondary | ICD-10-CM | POA: Insufficient documentation

## 2013-01-17 DIAGNOSIS — Z9181 History of falling: Secondary | ICD-10-CM | POA: Insufficient documentation

## 2013-01-17 DIAGNOSIS — Z87891 Personal history of nicotine dependence: Secondary | ICD-10-CM | POA: Insufficient documentation

## 2013-01-17 DIAGNOSIS — W19XXXA Unspecified fall, initial encounter: Secondary | ICD-10-CM | POA: Insufficient documentation

## 2013-01-17 DIAGNOSIS — J449 Chronic obstructive pulmonary disease, unspecified: Secondary | ICD-10-CM | POA: Insufficient documentation

## 2013-01-17 DIAGNOSIS — Z856 Personal history of leukemia: Secondary | ICD-10-CM | POA: Insufficient documentation

## 2013-01-17 DIAGNOSIS — I129 Hypertensive chronic kidney disease with stage 1 through stage 4 chronic kidney disease, or unspecified chronic kidney disease: Secondary | ICD-10-CM | POA: Insufficient documentation

## 2013-01-17 DIAGNOSIS — D649 Anemia, unspecified: Secondary | ICD-10-CM | POA: Insufficient documentation

## 2013-01-17 DIAGNOSIS — M109 Gout, unspecified: Secondary | ICD-10-CM | POA: Insufficient documentation

## 2013-01-17 DIAGNOSIS — I209 Angina pectoris, unspecified: Secondary | ICD-10-CM | POA: Insufficient documentation

## 2013-01-17 MED ORDER — MORPHINE SULFATE 4 MG/ML IJ SOLN
4.0000 mg | Freq: Once | INTRAMUSCULAR | Status: AC
  Administered 2013-01-17: 4 mg via INTRAVENOUS
  Filled 2013-01-17: qty 1

## 2013-01-17 MED ORDER — MORPHINE SULFATE (CONCENTRATE) 10 MG /0.5 ML PO SOLN
5.0000 mg | ORAL | Status: DC | PRN
  Administered 2013-01-17: 5 mg via SUBLINGUAL

## 2013-01-17 NOTE — Progress Notes (Signed)
i spent 81 mi taklking with the family regarding device management Their request was ato turn off the device tachy therapies.  He is DNR and on hospice; this decision is consistent with those decisions  I did this We also reviewed that he had developed persistent atrial fib about 6 weeks ago assoc with loss of BiV pacing and concurrent with loss of activity about 50% worth They will review amongst themselves given his poor quallity of life as to whether they would like to consider AV ablation, as he in not anticoagulated They plan to take him home tonight

## 2013-01-17 NOTE — Progress Notes (Signed)
Room [facility] A-3 Mubarak Whetstone -  [facility] RN Visit-R.Lalana Wachter RN  Related admission to [facility] diagnosis of CHF.  Pt is DNR code with OOF DNR on shadow chart.    Pt alert, confused (stated it was Jan 08, 1969-knew he was in [place] and in hospital), lying on ED stretcher, with complaints of back pain.  Wife and dtr present.  Patient's home medication list is on shadow chart.   Pt's legs "collapsed" at home following BSC episode.  Home care aide unable to get pt up and called EMS for help.  Pt became unresponsive to painful stimuli and EMS transported to hospital ED - family had DNR OOF in their presence at a doctors appointment.  During transport, pt became alert and responsive.  Pt declining any testing, and family agrees.  Pt stated comfort to him was being at home.  Dtr and wife checking with Cardiologist/ ED MD to see if  ICD/defib can be turned off during this ED visit.    PLEASE CALL [facility] @ [phone] WITH PATIENT'S DISPOSITION.  THANK YOU  Please call [facility] @ [phone]- ask for RN Liaison or after hours,ask for on-call RN with any hospice needs.   Thank you.  [name], RN  [facility]  Hospice Liaison  [phone])

## 2013-01-17 NOTE — ED Notes (Signed)
Pt from home, called for fall, Per EMS pt was unresponsive to painful stimuli. EMS states he woke suddenly enroute. Pt. Is DNR. Pt awake and alert at this time, Pt is disoriented.

## 2013-01-17 NOTE — ED Provider Notes (Signed)
CSN: [id number]     Arrival date & time 01/17/13  1529 History   First MD Initiated Contact with Patient 01/17/13 1540     Chief Complaint  Patient presents with  . Fall   (Consider location/radiation/quality/duration/timing/severity/associated sxs/prior Treatment) HPI Comments: Patient is an 79 year old male who is a hospice patient with an extensive medical history including cardiomyopathy, COPD, CKD who presents after an unresponsive episode at home. Patient was at home with his comfort aid who helped him to the bedside commode when the patient became very weak and was unresponsive for an unknown amount of time. The aid called EMS to help him up and then they proceeded to bring him to the ED. En route to the ED, the patient suddenly "woke up" per EMS. They think his defibrillator fired in the ambulance. Patient's daughter, who is the healthcare POA, and patient's wife were out of the house at a doctor's appointment and say "he should not have come to the hospital." The patient reports pain all over his body and does not want any interventions at this time. The hospice nurse is presents with the family. The patient is a DNR.   Patient is a 79 y.o. male presenting with fall.  Fall    Past Medical History  Diagnosis Date  . IHD (ischemic heart disease)      -  non-ST elevation MI in July 2009. and NSTEMI Jan 2010 in setting of RLL CAP. 99% diagonal  disease s/p stent July of 2009. Cath Jan 2010 showed  occulusion of SVG to RCA   LVEF 45%. rec med rx   - Echo 10/29/08 EF 25%  . PAD (peripheral artery disease)   . Hyperlipidemia   . Hypertension   . Obstructive sleep apnea      Refuses to wear his CPAP.  Not on oxygen   . Vocal cord dysfunction     with Botox injection at [facility] around 2005  per patient.     - Patient's swallowing difficulties may be related to vocal    cord dysfunction, recommended to be followed up at [facility]  . Stroke     History of left  brain cerebrovascular accident in 1998 without   . Dysphagia       -swallowing study nl oropharyngeal swallow, brief pause  at top of the esophagus which clears with liquid wash or small bites.    Rec  regular diet with thin liquids and continue management of    esophageal dysmotility.   - Barium swallow 10/31/08  mild dilation upper esosphagus, moderate slowing, can't r/o mass  . Anemia         03/21/2008 hgb 10.9, MCV 86, Stool occult blood postiive -> On IV Iron   . COPD (chronic obstructive pulmonary disease) 01/12/2012    - Hypoxemia, former smoker-  FEV1 1.86 (72%) , ratio 68% (07/26/08) - FEV1 1.61 (62%) , ratio 74    ( 10/29/08) with no insp or exp truncation on f/v loop  . IBM (inclusion body myositis) 2011    diagnosed three years ago per patient  . Coronary artery disease   . Myocardial infarction   . Anginal pain   . Dysrhythmia   . ICD (implantable cardiac defibrillator) in place   . Shortness of breath   . Diabetes mellitus without complication   . CHF (congestive heart failure)   . GERD (gastroesophageal reflux disease)   . Cancer  leukemia  . AML (acute myeloid leukemia) in remission   . Hospice care patient   . Gout, unspecified 04/17/2006  . Anxiety 02/21/2007  . Prostatitis, unspecified 06/18/2005  . Diverticulitis of colon (without mention of hemorrhage) 06/17/2005  . Hypertrophy of prostate with urinary obstruction and other lower urinary tract symptoms (LUTS) 01/12/2012  . Personal history of fall 01/12/2012  . Pneumonitis due to inhalation of food or vomitus 12/09/2011  . Other voice and resonance disorders 10/21/2011  . Chronic kidney disease, stage III (moderate) 05/27/2010  . Sciatica 05/27/2010  . Chronic pain syndrome 05/07/2010  . Diverticulosis of colon (without mention of hemorrhage) 05/07/2010  . Pain in joint, pelvic region and thigh 03/25/2010  . Hallucinations 03/04/2010  . Abnormality of gait 03/04/2010  . Debility, unspecified 10/07/2009  .  Insomnia, unspecified 07/16/2009  . Other primary cardiomyopathies 01/15/2009  . Atrial fibrillation 12/23/2008  . Long term (current) use of anticoagulants 12/23/2008  . Chronic sinusitis 10/09/2008  . Lumbago 10/09/2008  . Obstructive sleep apnea (adult) (pediatric) 09/14/2008  . Unspecified hereditary and idiopathic peripheral neuropathy 11/22/2006  . Impotence of organic origin 11/22/2006  . Dysphagia, unspecified(787.20) 11/22/2006  . Occlusion and stenosis of carotid artery without mention of cerebral infarction 06/20/2006   Past Surgical History  Procedure Laterality Date  . Coronary artery bypass graft    . Abdominal aortic aneurysm repair  09/1992    Hart Rochester, MD  . Aortobifemoral bypass graft in the past    . Icd insertion      ICD [employer]  . Ptca  08/1991    Little MD  . Cardiac catheterization  08/2001    Little,MD  . Lithotripsy  1998  . Myelogram  1990    Lumbar  . Cardiovascular stress test  11/1996    4% LVEF  . Cardiovascular stress test  12/1996    Abnormal   Family History  Problem Relation Age of Onset  . Alzheimer's disease Sister   . Alzheimer's disease Mother   . Heart disease Mother   . Cancer Sister   . AAA (abdominal aortic aneurysm) Sister   . COPD Sister   . AAA (abdominal aortic aneurysm) Brother   . Fibromyalgia Daughter   . Fibromyalgia Daughter   . Fibromyalgia Daughter   . Lupus Daughter   . Multiple sclerosis Maternal Uncle   . Multiple sclerosis Other     Neice (brothers daughter)  . Multiple sclerosis Other     Great neice    History  Substance Use Topics  . Smoking status: Former Smoker -- 0.50 packs/day for 15 years    Types: Cigarettes    Quit date: 02/23/1980  . Smokeless tobacco: Current User    Types: Chew  . Alcohol Use: Yes    Review of Systems  Psychiatric/Behavioral: Positive for confusion.  All other systems reviewed and are negative.    Allergies  Codeine; Inderal; Nsaids;  Sulfamethoxazole-trimethoprim; Tramadol; and Mirtazapine  Home Medications   Current Outpatient Rx  Name  Route  Sig  Dispense  Refill  . albuterol (PROVENTIL) (2.5 MG/3ML) 0.083% nebulizer solution      Inhale 1 unit dose vial nebulizer  4  times a day as needed for shortness of breathe.   30 mL   3   . ALPRAZolam (XANAX) 0.5 MG tablet   Oral   Take 0.5 mg by mouth at bedtime as needed for sleep. Take one tablet once a day at bedtime         .  bisacodyl (DULCOLAX) 10 MG suppository   Rectal   Place 10 mg rectally as needed for moderate constipation.         . cetirizine (ZYRTEC) 10 MG tablet   Oral   Take 10 mg by mouth daily. Take one tablet once a day as needed for allergies         . dextromethorphan-guaiFENesin (MUCINEX DM) 30-600 MG per 12 hr tablet   Oral   Take 1 tablet by mouth every 12 (twelve) hours. Take one twice daily for cough and congestion.         . furosemide (LASIX) 20 MG tablet   Oral   Take 20-60 mg by mouth 2 (two) times daily. Take 60mg  in AM and 20mg  in PM         . levalbuterol (XOPENEX HFA) 45 MCG/ACT inhaler   Inhalation   Inhale 1-2 puffs into the lungs every 4 (four) hours as needed for wheezing.   1 Inhaler   12   . metolazone (ZAROXOLYN) 5 MG tablet   Oral   Take 5 mg by mouth daily as needed (for 2lb weight gain). Take one tablet if patient gains 2 pounds         . morphine (ROXANOL) 20 MG/ML concentrated solution   Oral   Take 5 mg by mouth every 2 (two) hours as needed. For pain         . nitroGLYCERIN (NITROSTAT) 0.4 MG SL tablet   Sublingual   Place 0.4 mg under the tongue every 5 (five) minutes as needed for chest pain.         . Oxycodone HCl 10 MG TABS   Oral   Take 10 mg by mouth every 4 (four) hours as needed (for pain).         . polyethylene glycol (MIRALAX / GLYCOLAX) packet   Oral   Take 17 g by mouth daily.   14 each   5   . ranolazine (RANEXA) 500 MG 12 hr tablet   Oral   Take 500 mg by  mouth 2 (two) times daily.         Marland Kitchen senna-docusate (SENOKOT-S) 8.6-50 MG per tablet   Oral   Take 3 tablets by mouth 2 (two) times daily. Take daily for bowels.         . sertraline (ZOLOFT) 100 MG tablet   Oral   Take 100 mg by mouth every morning. Take 1 tablet orally daily to help nerves.         Marland Kitchen allopurinol (ZYLOPRIM) 300 MG tablet   Oral   Take 300 mg by mouth every evening. Take 1 tablet daily for gout.         Marland Kitchen aspirin 81 MG tablet   Oral   Take 1 tablet (81 mg total) by mouth every morning.   30 tablet   5   . benzonatate (TESSALON) 200 MG capsule   Oral   Take 200 mg by mouth 3 (three) times daily as needed for cough. Take one tablet at bedtime         . cholecalciferol (VITAMIN D) 1000 UNITS tablet   Oral   Take 2,000 Units by mouth every evening.         . diphenhydrAMINE (BENADRYL) 25 MG tablet   Oral   Take 25 mg by mouth every 6 (six) hours as needed for itching. Take one tablet at bedtime         .  gabapentin (NEURONTIN) 300 MG capsule   Oral   Take 1 capsule (300 mg total) by mouth at bedtime.   90 capsule   3   . glipiZIDE (GLUCOTROL) 5 MG tablet      TAKE 1 TABLET BY MOUTH EVERY MORNING REGARDLESS OF BLOOD SUGAR   90 tablet   0   . iron polysaccharides (NIFEREX) 150 MG capsule   Oral   Take 150 mg by mouth every morning.          . isosorbide-hydrALAZINE (BIDIL) 20-37.5 MG per tablet   Oral   Take 2 tablets by mouth 3 (three) times daily.   180 tablet   5   . nebivolol (BYSTOLIC) 5 MG tablet      Take 1/2 tablet by mouth once daily for blood pressure   30 tablet   6   . nitrofurantoin, macrocrystal-monohydrate, (MACROBID) 100 MG capsule      One twice daily for urine infection   20 capsule   1   . omeprazole (PRILOSEC) 20 MG capsule   Oral   Take 20-40 mg by mouth 2 (two) times daily. Take 2 capsules in the morning and 1 capsule in the evenings for reflux.         . ondansetron (ZOFRAN) 4 MG tablet   Oral    Take 4 mg by mouth every 8 (eight) hours as needed for nausea. Take one tablet every 6 hours as needed for nausea         . oxybutynin (DITROPAN) 5 MG tablet   Oral   Take 1 tablet (5 mg total) by mouth every 8 (eight) hours as needed.   45 tablet   0   . OXYGEN-HELIUM IN   Inhalation   Inhale into the lungs. Use 2-4 liters as needed         . PE-Diphenhydramine-DM-GG-APAP (MUCINEX FAST-MAX DAY/NIGHT PO)   Oral   Take by mouth. Take 1 to 2 tablets as needed for  cough and congestion         . pravastatin (PRAVACHOL) 40 MG tablet   Oral   Take 1 tablet (40 mg total) by mouth every evening.   30 tablet   3   . spironolactone (ALDACTONE) 25 MG tablet      Take one tablet once a day for edema   30 tablet   5    BP 118/87  Pulse 93  Resp 14  SpO2 100% Physical Exam  Nursing note and vitals reviewed. Constitutional: He appears well-developed and well-nourished. No distress.  HENT:  Head: Normocephalic and atraumatic.  Eyes: Conjunctivae and EOM are normal.  Neck: Normal range of motion.  Cardiovascular: Normal rate and regular rhythm.  Exam reveals no gallop and no friction rub.   No murmur heard. Pulmonary/Chest: Effort normal and breath sounds normal. He has no wheezes. He has no rales. He exhibits no tenderness.  Abdominal: Soft. He exhibits no distension. There is no tenderness. There is no rebound.  Musculoskeletal: Normal range of motion.  Neurological: He is alert.  Patient is oriented to place but not time. Speech is goal-oriented. Moves limbs without ataxia.   Skin: Skin is warm and dry.    ED Course  Procedures (including critical care time) Labs Review Labs Reviewed - No data to display Imaging Review No results found.  EKG Interpretation   None       MDM   1. Altered mental state     4:40 PM Patient  is choosing to have no intervention done and would like his defibrillator turned off. Patient's daughter and wife are present. The  daughter is the healthcare POA and the family agrees to have the defibrillator turned off. I will consult Dr. Clide Cliff, the patient's Cardiologist to have the defibrillator turned off.   5:05 PM I spoke with Ward Givens, NP with Cardiology who instructed me to call [employer] to have the device turned off.   8:50 PM Dr. Graciela Husbands saw the patient. His defibrillator was turned off. Patient was given pain medication and will be discharged. No interventions done in the ED due to patient and family wishes.   Emilia Beck, PA-C 01/17/13 2051

## 2013-01-19 NOTE — ED Provider Notes (Signed)
  I performed a history and physical examination of Dustin [name] and discussed his management with Ms. Murray Hodgkins.  I agree with the history, physical, assessment, and plan of care, with the following exceptions: None  I was present for the following procedures: None Time Spent in Critical Care of the patient: None Time spent in discussions with the patient and family: 46  I had extensive discussion with patient, patient's wife, patient's daughter, with hospice nurse at bedside.  Options were discussed,.  Patient and family voice agreement that they do not wish to proceed with evaluation or aggressive care which could prevent natural death.  The patient and family understand they can reconsider and seek medical care as wished at any time.   Holli Humbles, MD 01/19/13 [phone]

## 2013-01-22 ENCOUNTER — Other Ambulatory Visit: Payer: Self-pay | Admitting: Internal Medicine

## 2013-01-22 NOTE — Telephone Encounter (Signed)
Patient/daughter never returned call

## 2013-02-22 DEATH — deceased

## 2013-02-23 ENCOUNTER — Telehealth: Payer: Self-pay

## 2013-02-23 NOTE — Telephone Encounter (Signed)
Patient past away per Obituary in [place] [employer] asw °

## 2013-02-25 DIAGNOSIS — N644 Mastodynia: Secondary | ICD-10-CM | POA: Insufficient documentation

## 2013-05-21 ENCOUNTER — Ambulatory Visit: Payer: Medicare Other | Admitting: Internal Medicine

## 2013-07-17 ENCOUNTER — Ambulatory Visit: Payer: Medicare Other | Admitting: Internal Medicine

## 2014-04-17 IMAGING — US US SCROTUM
1 series · 14 of 25 positions shown · non-contrast
Comparison: none

Scrotal ultrasound; scrotal Doppler ultrasound
HISTORY: Left-sided scrotal pain

[Series 1: us scrotum · 0.08mm/px · 14 of 43 slices shown]
[im 1/43]
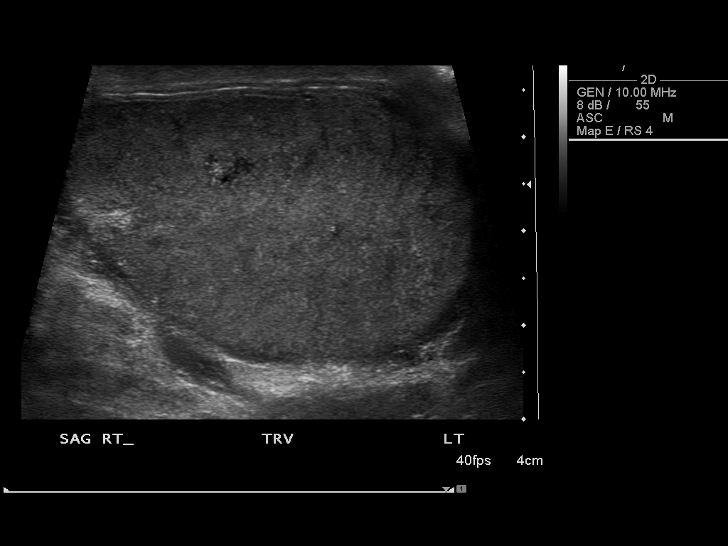
[im 4/43]
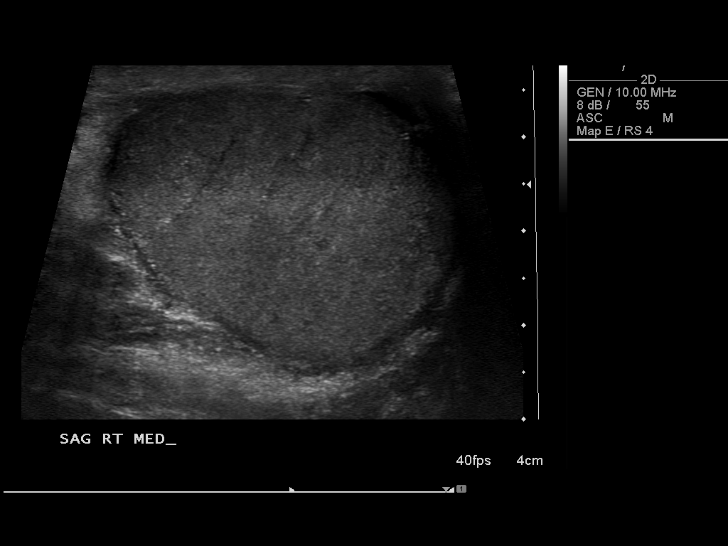
[im 8/43]
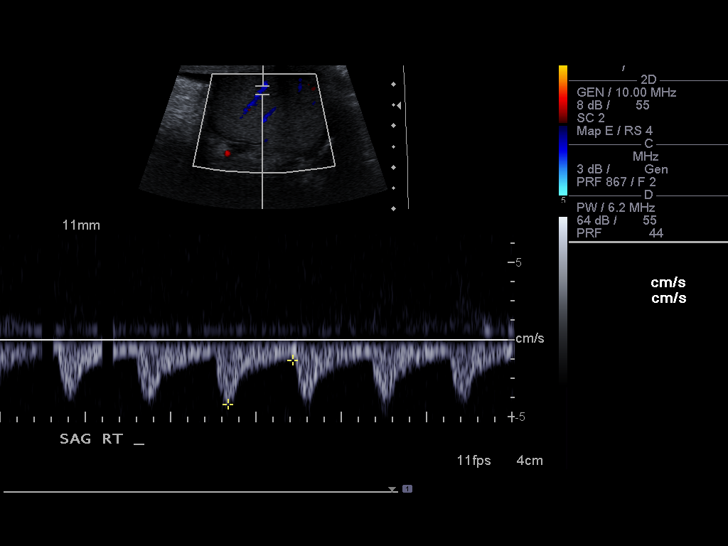
[im 11/43]
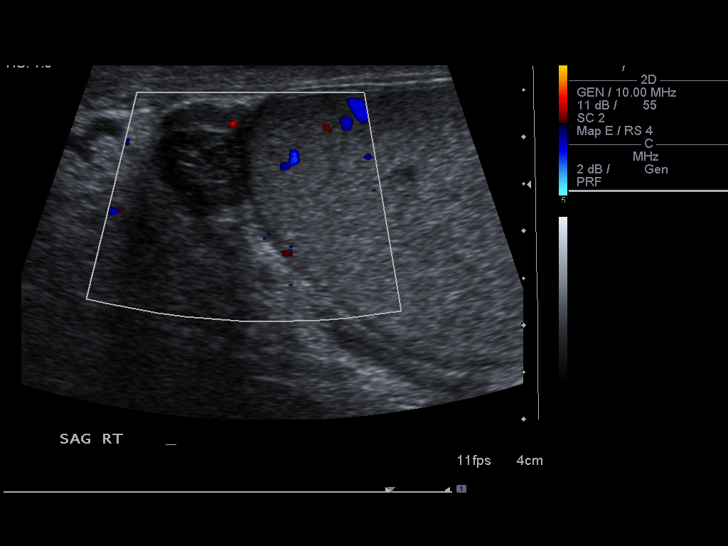
[im 15/43]
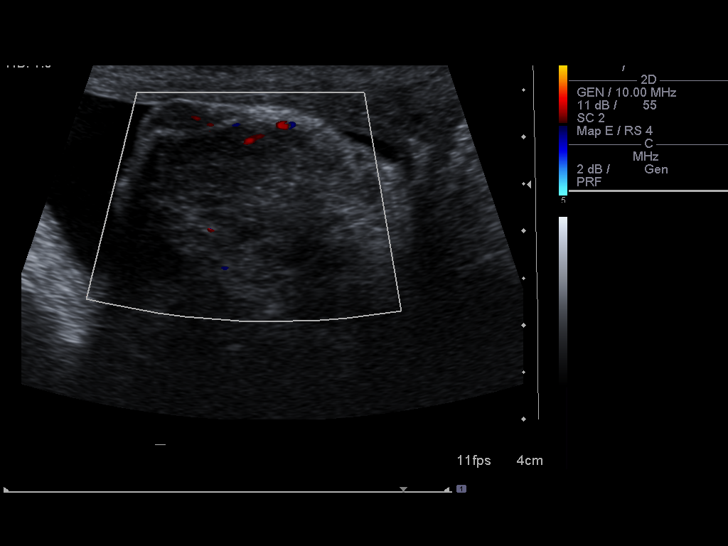
[im 16/43]
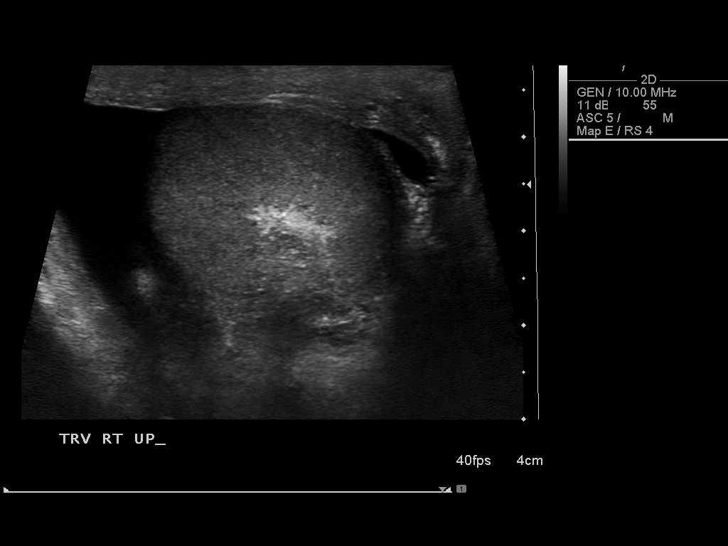
[im 20/43]
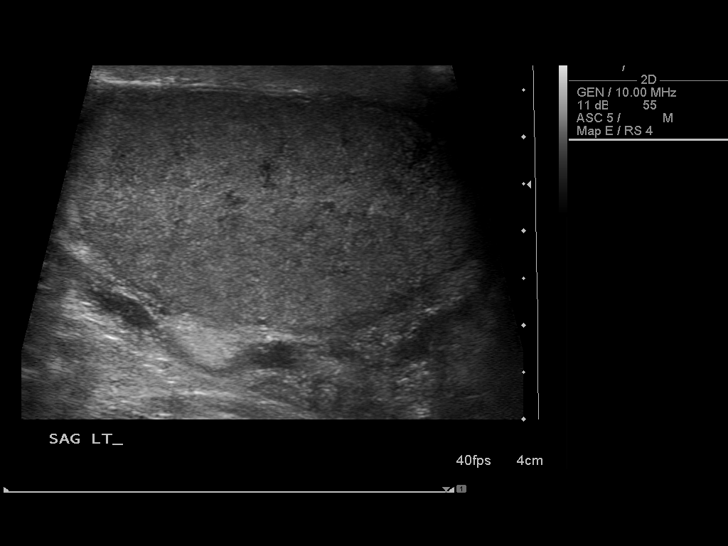
[im 23/43]
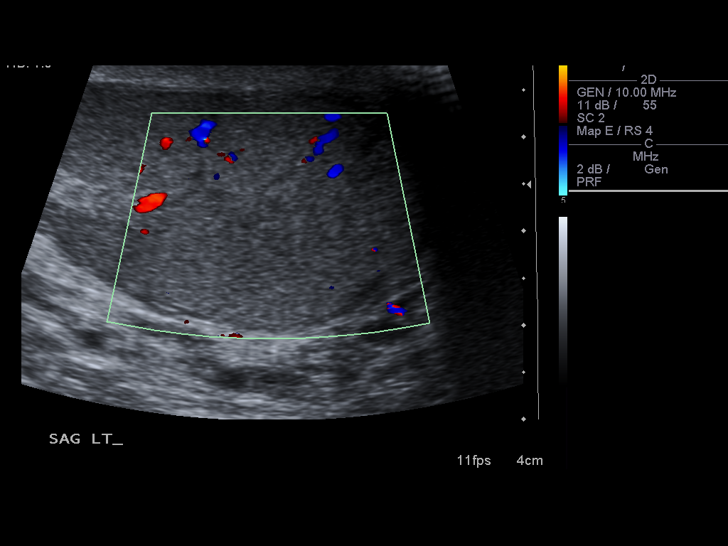
[im 27/43]
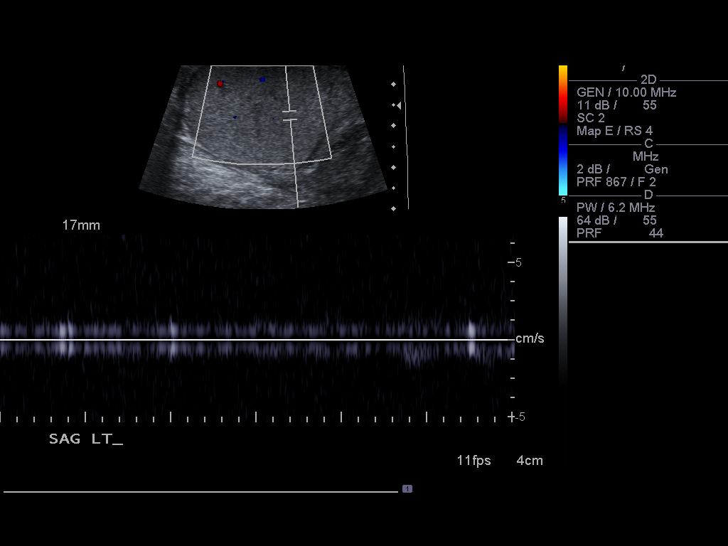
[im 29/43]
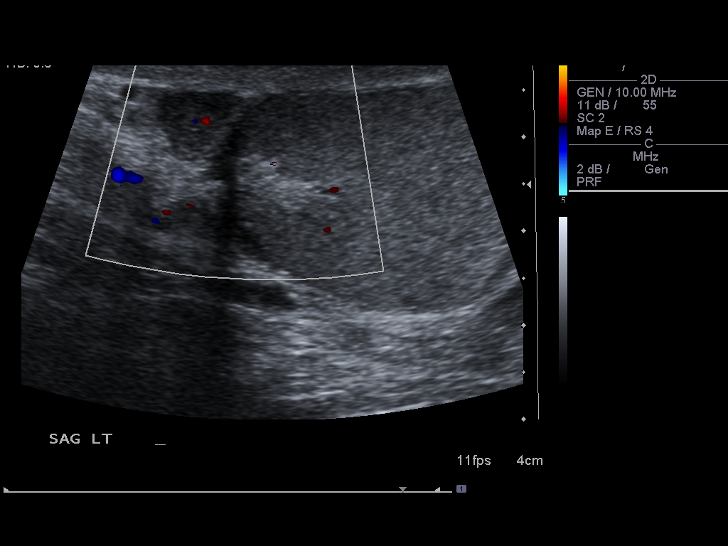
[im 32/43]
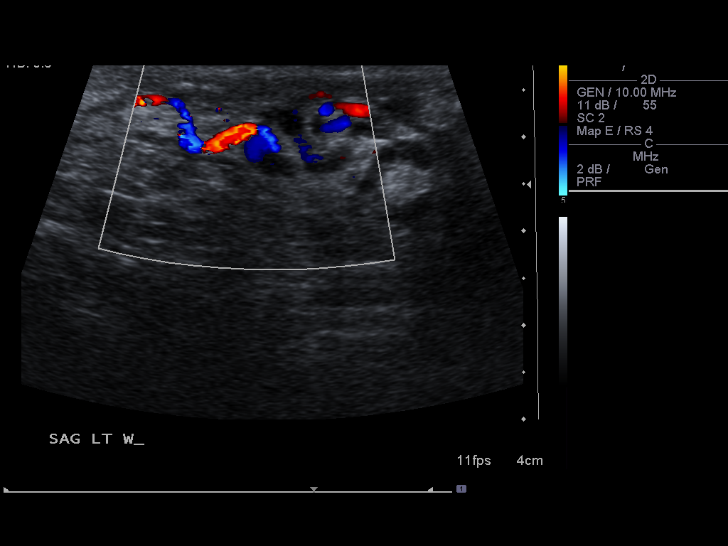
[im 36/43]
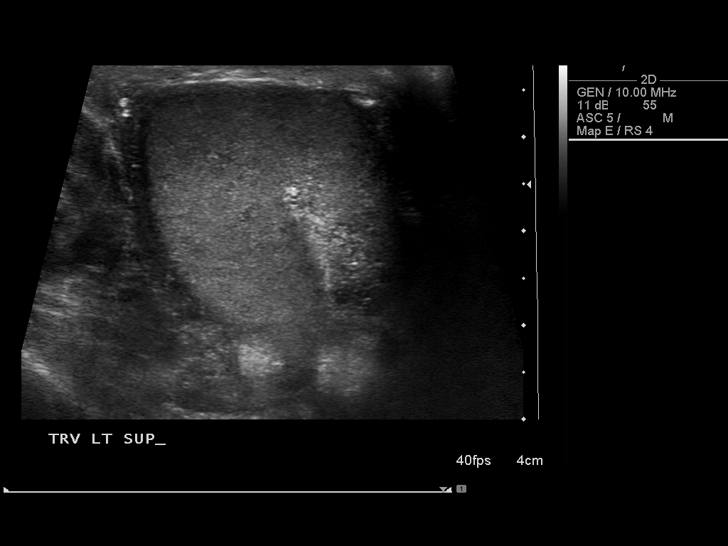
[im 39/43]
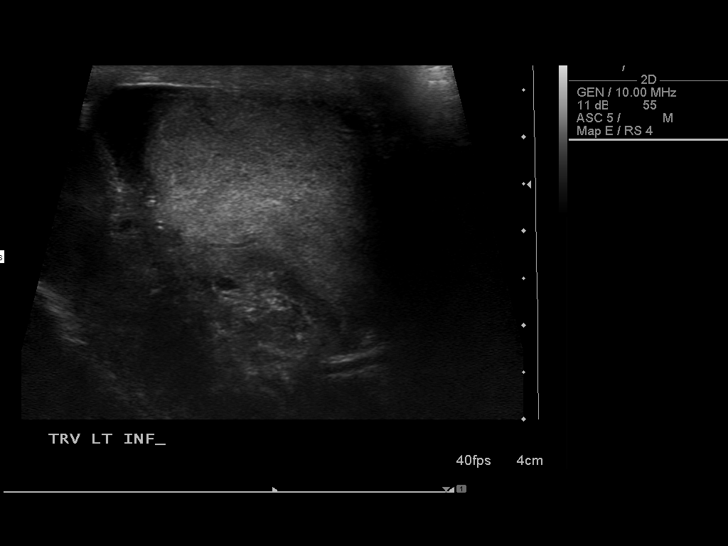
[im 43/43]
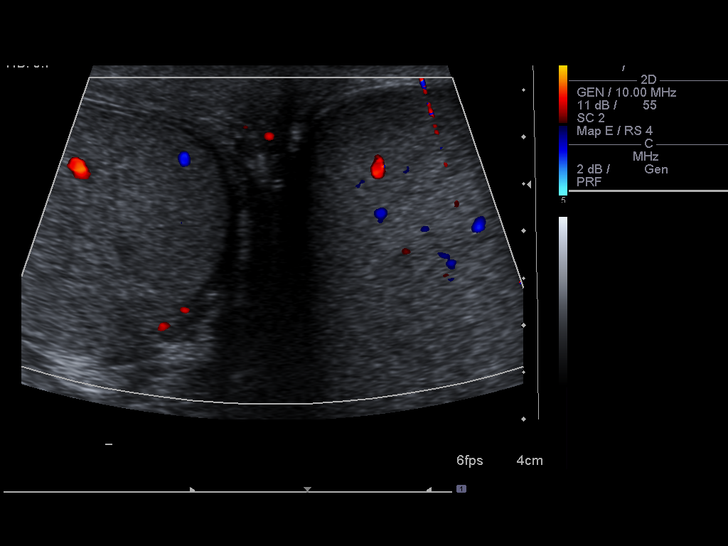

[14 of 25 positions shown; findings below may reference images not displayed]

FINDINGS: Real-time and Doppler interrogation of the scrotal
contents was performed.  Both testes are normal in size and
contour.  There is no intratesticular mass.  Color flow is seen in
both testes.

Both testes demonstrate low resistance wave forms.  The peak
systolic velocity in the right testis is  4.2 cm/sec with an end-
diastolic velocity of 1.3 cm/sec.  The peak systolic velocity in
the left testis is 4.8 cm/sec an end diastolic velocity of
cm/sec.  Venous outflow is demonstrated bilaterally.

The left epididymis appears somewhat edematous and hyperemic.  The
right epididymis appears normal. There are no extratesticular
masses on either side.

There is a small hydrocele on the left.  There is no appreciable
hydrocele on the right.  There is no scrotal abscess or wall
thickening.  There are small varicoceles bilaterally.
CONCLUSION: Evidence of left epididymitis.  Small varicoceles.
Small left hydrocele.  No intratesticular mass or torsion.
